# Patient Record
Sex: Male | Born: 1945 | Race: White | Hispanic: No | Marital: Single | Smoking: Never smoker
Health system: Southern US, Community
[De-identification: ages and names within clinical notes are randomized; demographics above are authoritative.]

## PROBLEM LIST (undated history)

## (undated) DIAGNOSIS — F329 Major depressive disorder, single episode, unspecified: Secondary | ICD-10-CM

## (undated) DIAGNOSIS — F32A Depression, unspecified: Secondary | ICD-10-CM

## (undated) DIAGNOSIS — E119 Type 2 diabetes mellitus without complications: Secondary | ICD-10-CM

## (undated) DIAGNOSIS — R4189 Other symptoms and signs involving cognitive functions and awareness: Secondary | ICD-10-CM

## (undated) DIAGNOSIS — I10 Essential (primary) hypertension: Secondary | ICD-10-CM

## (undated) DIAGNOSIS — H269 Unspecified cataract: Secondary | ICD-10-CM

## (undated) DIAGNOSIS — E785 Hyperlipidemia, unspecified: Secondary | ICD-10-CM

## (undated) HISTORY — DX: Major depressive disorder, single episode, unspecified: F32.9

## (undated) HISTORY — DX: Depression, unspecified: F32.A

## (undated) HISTORY — DX: Unspecified cataract: H26.9

## (undated) HISTORY — DX: Other symptoms and signs involving cognitive functions and awareness: R41.89

## (undated) HISTORY — DX: Essential (primary) hypertension: I10

## (undated) HISTORY — DX: Type 2 diabetes mellitus without complications: E11.9

## (undated) HISTORY — DX: Hyperlipidemia, unspecified: E78.5

## (undated) HISTORY — PX: DENTAL SURGERY: SHX609

## (undated) HISTORY — PX: EYE SURGERY: SHX253

## (undated) HISTORY — PX: OTHER SURGICAL HISTORY: SHX169

---

## 2000-10-15 ENCOUNTER — Encounter: Payer: Self-pay | Admitting: Emergency Medicine

## 2000-10-16 ENCOUNTER — Inpatient Hospital Stay (HOSPITAL_COMMUNITY): Admission: EM | Admit: 2000-10-16 | Discharge: 2000-10-17 | Payer: Self-pay | Admitting: Emergency Medicine

## 2006-07-20 ENCOUNTER — Ambulatory Visit: Payer: Self-pay | Admitting: Internal Medicine

## 2006-07-25 ENCOUNTER — Ambulatory Visit: Payer: Self-pay | Admitting: *Deleted

## 2006-07-28 ENCOUNTER — Ambulatory Visit (HOSPITAL_COMMUNITY): Admission: RE | Admit: 2006-07-28 | Discharge: 2006-07-28 | Payer: Self-pay | Admitting: Nurse Practitioner

## 2006-12-09 ENCOUNTER — Ambulatory Visit: Payer: Self-pay | Admitting: Internal Medicine

## 2007-01-19 ENCOUNTER — Encounter (INDEPENDENT_AMBULATORY_CARE_PROVIDER_SITE_OTHER): Payer: Self-pay | Admitting: Nurse Practitioner

## 2007-01-19 ENCOUNTER — Ambulatory Visit: Payer: Self-pay | Admitting: Family Medicine

## 2007-01-19 LAB — CONVERTED CEMR LAB: PSA: 1.26 ng/mL (ref 0.10–4.00)

## 2007-06-08 ENCOUNTER — Encounter (INDEPENDENT_AMBULATORY_CARE_PROVIDER_SITE_OTHER): Payer: Self-pay | Admitting: Nurse Practitioner

## 2007-06-08 ENCOUNTER — Ambulatory Visit: Payer: Self-pay | Admitting: Family Medicine

## 2007-06-08 LAB — CONVERTED CEMR LAB
Basophils Absolute: 0 10*3/uL (ref 0.0–0.1)
Basophils Relative: 0 % (ref 0–1)
Eosinophils Absolute: 0.2 10*3/uL (ref 0.0–0.7)
Eosinophils Relative: 3 % (ref 0–5)
HCT: 39.2 % (ref 39.0–52.0)
Hemoglobin: 12.9 g/dL — ABNORMAL LOW (ref 13.0–17.0)
Lymphocytes Relative: 22 % (ref 12–46)
Lymphs Abs: 1.7 10*3/uL (ref 0.7–4.0)
MCHC: 32.9 g/dL (ref 30.0–36.0)
MCV: 87.5 fL (ref 78.0–100.0)
Monocytes Absolute: 0.8 10*3/uL (ref 0.1–1.0)
Monocytes Relative: 10 % (ref 3–12)
Neutro Abs: 5.2 10*3/uL (ref 1.7–7.7)
Neutrophils Relative %: 65 % (ref 43–77)
Platelets: 295 10*3/uL (ref 150–400)
RBC: 4.48 M/uL (ref 4.22–5.81)
RDW: 14 % (ref 11.5–15.5)
WBC: 7.9 10*3/uL (ref 4.0–10.5)

## 2007-06-15 ENCOUNTER — Ambulatory Visit: Payer: Self-pay | Admitting: Internal Medicine

## 2007-06-19 ENCOUNTER — Ambulatory Visit: Payer: Self-pay | Admitting: Internal Medicine

## 2007-06-23 ENCOUNTER — Ambulatory Visit: Payer: Self-pay | Admitting: Internal Medicine

## 2007-11-16 ENCOUNTER — Ambulatory Visit: Payer: Self-pay | Admitting: Internal Medicine

## 2007-11-16 ENCOUNTER — Encounter (INDEPENDENT_AMBULATORY_CARE_PROVIDER_SITE_OTHER): Payer: Self-pay | Admitting: Adult Health

## 2007-11-16 LAB — CONVERTED CEMR LAB
ALT: 24 U/L
AST: 21 U/L
Albumin: 4.4 g/dL
Alkaline Phosphatase: 80 U/L
BUN: 14 mg/dL
Basophils Absolute: 0.1 K/uL
Basophils Relative: 1 %
CO2: 28 meq/L
Calcium: 9.7 mg/dL
Chloride: 103 meq/L
Creatinine, Ser: 0.85 mg/dL
Eosinophils Absolute: 0.4 K/uL
Eosinophils Relative: 4 %
Glucose, Bld: 93 mg/dL
HCT: 40.3 %
Hemoglobin: 13.2 g/dL
Lymphocytes Relative: 18 %
Lymphs Abs: 2.2 K/uL
MCHC: 32.8 g/dL
MCV: 88.6 fL
Microalb, Ur: 0.26 mg/dL
Monocytes Absolute: 1 K/uL
Monocytes Relative: 8 %
Neutro Abs: 8.4 K/uL — ABNORMAL HIGH
Neutrophils Relative %: 69 %
PSA: 1.48 ng/mL
Platelets: 309 K/uL
Potassium: 4.1 meq/L
RBC: 4.55 M/uL
RDW: 13.6 %
Sodium: 142 meq/L
T3 Uptake Ratio: 26.1 %
T4, Total: 10.7 ug/dL
TSH: 4.231 u[IU]/mL
Total Bilirubin: 0.6 mg/dL
Total Protein: 7.3 g/dL
WBC: 12.1 10*3/microliter — ABNORMAL HIGH

## 2007-11-23 ENCOUNTER — Ambulatory Visit: Payer: Self-pay | Admitting: Internal Medicine

## 2007-11-30 ENCOUNTER — Ambulatory Visit: Payer: Self-pay | Admitting: Internal Medicine

## 2007-12-07 ENCOUNTER — Ambulatory Visit: Payer: Self-pay | Admitting: Internal Medicine

## 2008-01-16 ENCOUNTER — Ambulatory Visit: Payer: Self-pay | Admitting: Internal Medicine

## 2008-04-10 ENCOUNTER — Encounter (INDEPENDENT_AMBULATORY_CARE_PROVIDER_SITE_OTHER): Payer: Self-pay | Admitting: Adult Health

## 2008-04-10 ENCOUNTER — Ambulatory Visit: Payer: Self-pay | Admitting: Internal Medicine

## 2008-04-10 LAB — CONVERTED CEMR LAB
ALT: 25 units/L (ref 0–53)
AST: 24 units/L (ref 0–37)
Albumin: 4.4 g/dL (ref 3.5–5.2)
Alkaline Phosphatase: 75 units/L (ref 39–117)
BUN: 16 mg/dL (ref 6–23)
Band Neutrophils: 0 % (ref 0–10)
Basophils Absolute: 0.1 10*3/uL (ref 0.0–0.1)
Basophils Relative: 1 % (ref 0–1)
CO2: 25 meq/L (ref 19–32)
Calcium: 9.4 mg/dL (ref 8.4–10.5)
Chloride: 102 meq/L (ref 96–112)
Cholesterol: 164 mg/dL (ref 0–200)
Creatinine, Ser: 1.09 mg/dL (ref 0.40–1.50)
Eosinophils Absolute: 0.4 10*3/uL (ref 0.0–0.7)
Eosinophils Relative: 4 % (ref 0–5)
Glucose, Bld: 125 mg/dL — ABNORMAL HIGH (ref 70–99)
HCT: 38.7 % — ABNORMAL LOW (ref 39.0–52.0)
HDL: 29 mg/dL — ABNORMAL LOW (ref >39)
Hemoglobin: 13.2 g/dL (ref 13.0–17.0)
LDL Cholesterol: 69 mg/dL (ref 0–99)
Lymphocytes Relative: 21 % (ref 12–46)
Lymphs Abs: 2.1 10*3/uL (ref 0.7–4.0)
MCHC: 34.1 g/dL (ref 30.0–36.0)
MCV: 85.2 fL (ref 78.0–100.0)
Microalb, Ur: 0.5 mg/dL (ref 0.00–1.89)
Monocytes Absolute: 1 10*3/uL (ref 0.1–1.0)
Monocytes Relative: 10 % (ref 3–12)
Neutro Abs: 6.3 10*3/uL (ref 1.7–7.7)
Neutrophils Relative %: 64 % (ref 43–77)
Platelets: 244 10*3/uL (ref 150–400)
Potassium: 3.9 meq/L (ref 3.5–5.3)
RBC: 4.54 M/uL (ref 4.22–5.81)
RDW: 14.1 % (ref 11.5–15.5)
Sodium: 142 meq/L (ref 135–145)
Total Bilirubin: 0.5 mg/dL (ref 0.3–1.2)
Total CHOL/HDL Ratio: 5.7
Total Protein: 7.2 g/dL (ref 6.0–8.3)
Triglycerides: 332 mg/dL — ABNORMAL HIGH (ref <150)
VLDL: 66 mg/dL — ABNORMAL HIGH (ref 0–40)
WBC: 9.8 10*3/uL (ref 4.0–10.5)

## 2008-06-05 ENCOUNTER — Ambulatory Visit: Payer: Self-pay | Admitting: Internal Medicine

## 2008-06-05 ENCOUNTER — Encounter (INDEPENDENT_AMBULATORY_CARE_PROVIDER_SITE_OTHER): Payer: Self-pay | Admitting: Adult Health

## 2008-06-05 LAB — CONVERTED CEMR LAB
ALT: 22 units/L (ref 0–53)
AST: 19 units/L (ref 0–37)
Albumin: 4.4 g/dL (ref 3.5–5.2)
Alkaline Phosphatase: 73 units/L (ref 39–117)
BUN: 17 mg/dL (ref 6–23)
CO2: 28 meq/L (ref 19–32)
Calcium: 9.3 mg/dL (ref 8.4–10.5)
Chloride: 101 meq/L (ref 96–112)
Cholesterol: 122 mg/dL (ref 0–200)
Creatinine, Ser: 0.94 mg/dL (ref 0.40–1.50)
Glucose, Bld: 157 mg/dL — ABNORMAL HIGH (ref 70–99)
HDL: 32 mg/dL — ABNORMAL LOW (ref >39)
LDL Cholesterol: 35 mg/dL (ref 0–99)
Potassium: 4 meq/L (ref 3.5–5.3)
Sodium: 142 meq/L (ref 135–145)
Total Bilirubin: 0.5 mg/dL (ref 0.3–1.2)
Total CHOL/HDL Ratio: 3.8
Total Protein: 7.2 g/dL (ref 6.0–8.3)
Triglycerides: 274 mg/dL — ABNORMAL HIGH (ref <150)
VLDL: 55 mg/dL — ABNORMAL HIGH (ref 0–40)

## 2008-07-15 ENCOUNTER — Ambulatory Visit: Payer: Self-pay | Admitting: Internal Medicine

## 2008-08-02 ENCOUNTER — Ambulatory Visit: Payer: Self-pay | Admitting: Internal Medicine

## 2008-10-15 ENCOUNTER — Ambulatory Visit: Payer: Self-pay | Admitting: Internal Medicine

## 2008-12-23 ENCOUNTER — Encounter (INDEPENDENT_AMBULATORY_CARE_PROVIDER_SITE_OTHER): Payer: Self-pay | Admitting: Adult Health

## 2008-12-23 ENCOUNTER — Ambulatory Visit: Payer: Self-pay | Admitting: Internal Medicine

## 2008-12-23 LAB — CONVERTED CEMR LAB
ALT: 16 units/L (ref 0–53)
AST: 17 units/L (ref 0–37)
Albumin: 4.4 g/dL (ref 3.5–5.2)
Alkaline Phosphatase: 60 units/L (ref 39–117)
BUN: 20 mg/dL (ref 6–23)
CO2: 25 meq/L (ref 19–32)
Calcium: 9.2 mg/dL (ref 8.4–10.5)
Chloride: 101 meq/L (ref 96–112)
Cholesterol: 134 mg/dL (ref 0–200)
Creatinine, Ser: 0.91 mg/dL (ref 0.40–1.50)
Glucose, Bld: 141 mg/dL — ABNORMAL HIGH (ref 70–99)
HDL: 31 mg/dL — ABNORMAL LOW (ref >39)
LDL Cholesterol: 69 mg/dL (ref 0–99)
Potassium: 4.5 meq/L (ref 3.5–5.3)
Sodium: 141 meq/L (ref 135–145)
Total Bilirubin: 0.4 mg/dL (ref 0.3–1.2)
Total CHOL/HDL Ratio: 4.3
Total Protein: 6.8 g/dL (ref 6.0–8.3)
Triglycerides: 171 mg/dL — ABNORMAL HIGH (ref <150)
VLDL: 34 mg/dL (ref 0–40)

## 2009-01-08 ENCOUNTER — Ambulatory Visit: Payer: Self-pay | Admitting: Internal Medicine

## 2009-04-08 ENCOUNTER — Encounter (INDEPENDENT_AMBULATORY_CARE_PROVIDER_SITE_OTHER): Payer: Self-pay | Admitting: Adult Health

## 2009-04-08 ENCOUNTER — Ambulatory Visit: Payer: Self-pay | Admitting: Internal Medicine

## 2009-04-08 LAB — CONVERTED CEMR LAB: Microalb, Ur: 0.68 mg/dL (ref 0.00–1.89)

## 2009-07-07 ENCOUNTER — Ambulatory Visit: Payer: Self-pay | Admitting: Internal Medicine

## 2009-07-07 ENCOUNTER — Encounter (INDEPENDENT_AMBULATORY_CARE_PROVIDER_SITE_OTHER): Payer: Self-pay | Admitting: Adult Health

## 2009-07-07 LAB — CONVERTED CEMR LAB
AST: 15 units/L (ref 0–37)
BUN: 20 mg/dL (ref 6–23)
Calcium: 9.2 mg/dL (ref 8.4–10.5)
Chloride: 101 meq/L (ref 96–112)
Creatinine, Ser: 0.89 mg/dL (ref 0.40–1.50)
HDL: 28 mg/dL — ABNORMAL LOW (ref >39)
Total CHOL/HDL Ratio: 4.3
Vitamin B-12: 194 pg/mL — ABNORMAL LOW (ref 211–911)

## 2009-07-21 ENCOUNTER — Ambulatory Visit: Payer: Self-pay | Admitting: Adult Health

## 2009-07-28 ENCOUNTER — Ambulatory Visit: Payer: Self-pay | Admitting: Internal Medicine

## 2009-08-05 ENCOUNTER — Ambulatory Visit: Payer: Self-pay | Admitting: Internal Medicine

## 2009-08-11 ENCOUNTER — Ambulatory Visit: Payer: Self-pay | Admitting: Internal Medicine

## 2009-09-01 ENCOUNTER — Encounter (INDEPENDENT_AMBULATORY_CARE_PROVIDER_SITE_OTHER): Payer: Self-pay | Admitting: Adult Health

## 2009-09-01 ENCOUNTER — Ambulatory Visit: Payer: Self-pay | Admitting: Internal Medicine

## 2009-09-15 ENCOUNTER — Ambulatory Visit: Payer: Self-pay | Admitting: Internal Medicine

## 2009-10-13 ENCOUNTER — Ambulatory Visit: Payer: Self-pay | Admitting: Internal Medicine

## 2009-11-13 ENCOUNTER — Ambulatory Visit: Payer: Self-pay | Admitting: Internal Medicine

## 2009-12-03 ENCOUNTER — Encounter (INDEPENDENT_AMBULATORY_CARE_PROVIDER_SITE_OTHER): Payer: Self-pay | Admitting: *Deleted

## 2009-12-03 LAB — CONVERTED CEMR LAB
HDL: 30 mg/dL — ABNORMAL LOW (ref >39)
LDL Cholesterol: 67 mg/dL (ref 0–99)
Total CHOL/HDL Ratio: 4.4
Triglycerides: 171 mg/dL — ABNORMAL HIGH (ref <150)
VLDL: 34 mg/dL (ref 0–40)

## 2010-06-19 NOTE — Consult Note (Signed)
[place]. [facility]  Patient:    Nicholas Hardin, Nicholas Hardin Visit Number: [id number] MRN: [id number]          Service Type: MED Location: [id number] Attending Physician:  Marily Memos Dictated by:   Tammy R. Collins Scotland, M.D. Admit Date:  10/15/2000                            Consultation Report  DATE OF BIRTH:  May 30, 1935  CHIEF COMPLAINT:  Vomiting, dizziness and decreased responsiveness.  HISTORY OF PRESENT ILLNESS:  The patient is a 65 year old white male who had one episode of nausea and vomiting Friday night after eating a Kizzie Fantasia with his family.  He felt bad most of the day yesterday and had an episode of decreased responsiveness, nausea and vomiting x 2, and was noted to feel hot. EMS was called and he was transported to the emergency room.  He had one subsequent episode of vomiting in the emergency room.  No diarrhea.  No history of sick exposure.  He complains of dizziness, like being on a Ferris wheel, that started at home suddenly and has since improved.  He complains of slight headache.   He has had no melena, BRBPR, or hematemesis.  he does complain of mild diffuse abdominal pain.  PAST MEDICAL HISTORY:  Questionable IBS (prescribed ______ for stomach problems per mother).  Teeth pulled.  "Slow learner."  ALLERGIES:  NKDA.  MEDICATIONS:  ______ 0.375 mg, one p.o. b.i.d.  SOCIAL HISTORY:  He works at [employer] as a Public affairs consultant.  He lives with his father.  No tobacco, alcohol or drugs.  FAMILY HISTORY:  Positive for diabetes, CAD, stroke, cirrhosis.  REVIEW OF SYSTEMS:  Positive for fever, nausea, vomiting, dizziness, mild headache, and mild abdominal pain.  No hematemesis or melena.  No BRBPR.  No diarrhea or dysuria.  No frequency or cough.  No sore throat or rhinorrhea. He had complaints of right arm pain at the site of blood draw after it was drawn.  No chest pain.  PHYSICAL EXAMINATION:  VITAL SIGNS:  Temperature 100.0, blood  pressure 134/71, pulse 86, respirations 20, pulse oximetry 94% on room air.  GENERAL:   Obese.  NAD.  Licking lips.  SKIN:  Warm and dry.  No rash.  HEENT:  TMs clear.  EOMI.  PERRL.  Fundi poorly visualized.  No scleral icterus.  No nystagmus.  Oropharynx clear.  Moist mucosa.  Edentulous.  NECK:  Supple.  No thyromegaly.  No JVD or bruits.  LUNGS:  Clear to auscultation bilaterally with decreased breath sounds.  No wheezes or crackles.  BACK:  No CVAT.  CARDIOVASCULAR:  RRR.  S1 and S2.  No MHR.  ABDOMEN:  Soft.  Positive bowel sounds.  Mild diffuse tenderness.  No HSM.  No masses.  GENITOURINARY:  NMG.  Uncircumcised.  Descended testicles.  RECTAL:  Guaiac negative.  Normal tone.  Prostate not palpated.  EXTREMITIES:  No CCE.  Radial and DP pulsed 2+  NEUROLOGIC:  Alert and oriented x 3.  Cranial nerves II-XII intact.  ______. Trace bicep and knee DTR.  Sensory intact to fine touch.  LABORATORY DATA:  White blood cell count 19.1, hemoglobin 13.4, platelets 313, 87% neutrophils.  AMC 16.6, sodium 139, potassium 3.5, chloride 102, bicarb 26, BUN 9, glucose 199.  UA shows specific gravity 1.014, negative nitrates, trace LE, 3-6 wbcs, rare bacteria.  One-view chest x-ray shows  cardiomegaly.  Abdominal films show ileus.  ASSESSMENT AND PLAN:  A 65 year old white male with neutrophilia, nausea, vomiting, ileus, and vertigo with normal neurologic exam.  To be admitted for IV fluids, n.p.o., two blood cultures for source of infection.  Repeat chest x-ray.  Culture urine.  Check hemoglobin A1c with elevated glucose.  Observe. Dictated by:   Tammy R. Collins Scotland, M.D. Attending Physician:  Marily Memos DD:  10/16/00 TD:  10/16/00 Job: [id number]

## 2011-12-20 ENCOUNTER — Ambulatory Visit (INDEPENDENT_AMBULATORY_CARE_PROVIDER_SITE_OTHER): Payer: Medicare Other | Admitting: Family Medicine

## 2011-12-20 VITALS — BP 130/60 | HR 60 | Temp 97.3°F | Resp 16 | Ht 63.0 in | Wt 229.0 lb

## 2011-12-20 DIAGNOSIS — I10 Essential (primary) hypertension: Secondary | ICD-10-CM

## 2011-12-20 DIAGNOSIS — E119 Type 2 diabetes mellitus without complications: Secondary | ICD-10-CM

## 2011-12-20 DIAGNOSIS — E785 Hyperlipidemia, unspecified: Secondary | ICD-10-CM

## 2011-12-20 DIAGNOSIS — Z79899 Other long term (current) drug therapy: Secondary | ICD-10-CM

## 2011-12-20 LAB — COMPREHENSIVE METABOLIC PANEL
ALT: 13 U/L (ref 0–53)
CO2: 30 mEq/L (ref 19–32)
Calcium: 9.5 mg/dL (ref 8.4–10.5)
Chloride: 102 mEq/L (ref 96–112)
Creat: 1.1 mg/dL (ref 0.50–1.35)
Glucose, Bld: 158 mg/dL — ABNORMAL HIGH (ref 70–99)
Total Bilirubin: 0.7 mg/dL (ref 0.3–1.2)

## 2011-12-20 LAB — LIPID PANEL
Cholesterol: 150 mg/dL (ref 0–200)
Total CHOL/HDL Ratio: 4.7 Ratio
Triglycerides: 264 mg/dL — ABNORMAL HIGH (ref <150)
VLDL: 53 mg/dL — ABNORMAL HIGH (ref 0–40)

## 2011-12-20 MED ORDER — SIMVASTATIN 20 MG PO TABS: 20.0000 mg | ORAL_TABLET | Freq: Every evening | ORAL | Status: DC

## 2011-12-20 MED ORDER — GLIMEPIRIDE 4 MG PO TABS: 4.0000 mg | ORAL_TABLET | Freq: Two times a day (BID) | ORAL | Status: DC

## 2011-12-20 MED ORDER — AMMONIUM LACTATE 12 % EX CREA: 12.0000 | TOPICAL_CREAM | CUTANEOUS | Status: DC | PRN

## 2011-12-20 MED ORDER — ATENOLOL 50 MG PO TABS: 50.0000 mg | ORAL_TABLET | Freq: Every day | ORAL | Status: DC

## 2011-12-20 MED ORDER — LISINOPRIL 5 MG PO TABS: 5.0000 mg | ORAL_TABLET | Freq: Every day | ORAL | Status: DC

## 2011-12-20 MED ORDER — HYDROCHLOROTHIAZIDE 25 MG PO TABS: 25.0000 mg | ORAL_TABLET | Freq: Every day | ORAL | Status: DC

## 2011-12-20 NOTE — Progress Notes (Signed)
Subjective:    Patient ID: Nicholas Hardin, male    DOB: March 03, 1945, 66 y.o.   MRN: [id number]  HPI  Nicholas Hardin is a delightful 66 yo male, a former pt of mine from [place], who is here today to re-establish care and is accompanied by Ms. Kathi Der, his aunt, who checks and records his cbgs. Fasting cbgs have been running 149-211 - mainly in 170s. No hypoglycemic episodes. Reports he is doing well on the amaryl.  I don't remember but Ms. Christell Constant reminds me that we had previously tried pt on metformin prev but apparently I stopped it - they report it was because his DM was well controlled.  Walking 1 1/2 mi total daily - goes on a short lap around home about 5x/day - and trying to eat healthy Seeing Dr. Ralene Cork q 3mos (podiatry) and Dr. Dione Booze (optho) yrly. Checks feet daily and no problems with them.    Past Medical History  Diagnosis Date  . Diabetes mellitus without complication   . Hypertension    Current Outpatient Prescriptions on File Prior to Visit  Medication Sig Dispense Refill  . atenolol (TENORMIN) 50 MG tablet Take 1 tablet (50 mg total) by mouth daily.  30 tablet  5  . ferrous sulfate 325 (65 FE) MG tablet Take 325 mg by mouth daily with breakfast.      . glimepiride (AMARYL) 4 MG tablet Take 1 tablet (4 mg total) by mouth 2 (two) times daily.  60 tablet  5  . hydrochlorothiazide (HYDRODIURIL) 25 MG tablet Take 1 tablet (25 mg total) by mouth daily.  30 tablet  5  . lisinopril (PRINIVIL,ZESTRIL) 5 MG tablet Take 1 tablet (5 mg total) by mouth daily.  30 tablet  5  . simvastatin (ZOCOR) 20 MG tablet Take 1 tablet (20 mg total) by mouth every evening.  30 tablet  5     Review of Systems  Constitutional: Negative for fever, chills, activity change, appetite change and unexpected weight change.  Eyes: Negative for visual disturbance.  Cardiovascular: Negative for leg swelling.  Musculoskeletal: Positive for arthralgias. Negative for myalgias, joint swelling and gait problem.  Skin:  Negative for color change and rash.  Neurological: Negative for dizziness, syncope, weakness and light-headedness.  Psychiatric/Behavioral: Negative for behavioral problems, sleep disturbance and dysphoric mood. The patient is not nervous/anxious.       BP 130/60  Pulse 60  Temp 97.3 F (36.3 C) (Oral)  Resp 16  Ht 5\' 3"  (1.6 m)  Wt 229 lb (103.874 kg)  BMI 40.57 kg/m2  SpO2 98% Objective:   Physical Exam  Constitutional: He appears well-developed and well-nourished. No distress.  HENT:  Head: Normocephalic and atraumatic.  Eyes: Conjunctivae normal are normal. Pupils are equal, round, and reactive to light. No scleral icterus.  Neck: Normal range of motion. Neck supple. No thyromegaly present.  Cardiovascular: Normal rate, regular rhythm, normal heart sounds and intact distal pulses.   Pulmonary/Chest: Effort normal and breath sounds normal. No respiratory distress.  Musculoskeletal: He exhibits no edema.  Lymphadenopathy:    He has no cervical adenopathy.  Neurological: He is alert.  Skin: Skin is warm and dry. He is not diaphoretic.  Psychiatric: He has a normal mood and affect. His behavior is normal.      Results for orders placed in visit on 12/20/11  POCT GLYCOSYLATED HEMOGLOBIN (HGB A1C)      Component Value Range   Hemoglobin A1C 6.3    COMPREHENSIVE METABOLIC PANEL  Component Value Range   Sodium 140  135 - 145 mEq/L   Potassium 4.2  3.5 - 5.3 mEq/L   Chloride 102  96 - 112 mEq/L   CO2 30  19 - 32 mEq/L   Glucose, Bld 158 (*) 70 - 99 mg/dL   BUN 25 (*) 6 - 23 mg/dL   Creat 1.61  0.96 - 0.45 mg/dL   Total Bilirubin 0.7  0.3 - 1.2 mg/dL   Alkaline Phosphatase 51  39 - 117 U/L   AST 15  0 - 37 U/L   ALT 13  0 - 53 U/L   Total Protein 6.8  6.0 - 8.3 g/dL   Albumin 4.6  3.5 - 5.2 g/dL   Calcium 9.5  8.4 - 40.9 mg/dL  LIPID PANEL      Component Value Range   Cholesterol 150  0 - 200 mg/dL   Triglycerides 811 (*) <150 mg/dL   HDL 32 (*) >91 mg/dL    Total CHOL/HDL Ratio 4.7     VLDL 53 (*) 0 - 40 mg/dL   LDL Cholesterol 65  0 - 99 mg/dL       Assessment & Plan:   1. Diabetes  POCT glycosylated hemoglobin (Hb A1C), glimepiride (AMARYL) 4 MG tablet, lisinopril (PRINIVIL,ZESTRIL) 5 MG tablet A1C and reported home cbgs certainly don't correlate. However, since pt is not able to check his own cbgs and Ms. Christell Constant, his aunt who does check them, does not live with him, I understand why the random sugars may not be overall acurrate of the larger picture.  It would be best to have pt on metformin rather than (or in addition to) amaryl - will get his prior records to review if we tried it and why it was stopped as well as past control. Cont asa 81, will need urine microalb at f/u.  2. Encounter for long-term (current) use of other medications  Comprehensive metabolic panel  3. Other and unspecified hyperlipidemia  Lipid panel, simvastatin (ZOCOR) 20 MG tablet Goal LDL <100.  Pt has elevated triglycerides so we may want to augment w/ fibrate but will get prior records to see what we have tried in the past first.  4. Unspecified essential hypertension  Comprehensive metabolic panel, atenolol (TENORMIN) 50 MG tablet, hydrochlorothiazide (HYDRODIURIL) 25 MG tablet, lisinopril (PRINIVIL,ZESTRIL) 5 MG tablet Consider stopping atenolol and placing on lisinopril-hctz 20/25 to reduce copays (3 ->1) and increase compliance.  Get prior records first.

## 2011-12-21 ENCOUNTER — Encounter: Payer: Self-pay | Admitting: Family Medicine

## 2012-01-19 ENCOUNTER — Telehealth: Payer: Self-pay

## 2012-02-02 ENCOUNTER — Other Ambulatory Visit: Payer: Self-pay | Admitting: Radiology

## 2012-02-02 NOTE — Telephone Encounter (Signed)
[facility] 6 month supply of his lisinopril 5mg , sent this in, since Dr Clelia Croft did authorize the 5 refills, changed to 6 mo so he can get all at once.

## 2012-04-20 ENCOUNTER — Telehealth: Payer: Self-pay

## 2012-04-20 DIAGNOSIS — E119 Type 2 diabetes mellitus without complications: Secondary | ICD-10-CM

## 2012-04-20 DIAGNOSIS — I10 Essential (primary) hypertension: Secondary | ICD-10-CM

## 2012-04-20 MED ORDER — LISINOPRIL 5 MG PO TABS: 5.0000 mg | ORAL_TABLET | Freq: Every day | ORAL | Status: DC

## 2012-04-20 NOTE — Telephone Encounter (Signed)
Yes, that's fine. Thanks!

## 2012-04-20 NOTE — Telephone Encounter (Signed)
Dr Clelia Croft, [facility] sent a fax requesting a 6 mos RF of his lisinopril 5 mg. Do you want to authorize for this long?

## 2012-05-25 ENCOUNTER — Other Ambulatory Visit: Payer: Self-pay | Admitting: Radiology

## 2012-05-25 NOTE — Telephone Encounter (Signed)
, °

## 2012-06-16 ENCOUNTER — Encounter: Payer: Self-pay | Admitting: Family Medicine

## 2012-06-16 ENCOUNTER — Ambulatory Visit: Payer: Medicare Other

## 2012-06-16 ENCOUNTER — Ambulatory Visit (INDEPENDENT_AMBULATORY_CARE_PROVIDER_SITE_OTHER): Payer: Medicare Other | Admitting: Family Medicine

## 2012-06-16 VITALS — BP 128/62 | HR 58 | Temp 97.7°F | Resp 16 | Ht 64.0 in | Wt 234.6 lb

## 2012-06-16 DIAGNOSIS — Z79899 Other long term (current) drug therapy: Secondary | ICD-10-CM

## 2012-06-16 DIAGNOSIS — R197 Diarrhea, unspecified: Secondary | ICD-10-CM

## 2012-06-16 DIAGNOSIS — E119 Type 2 diabetes mellitus without complications: Secondary | ICD-10-CM

## 2012-06-16 DIAGNOSIS — R1013 Epigastric pain: Secondary | ICD-10-CM

## 2012-06-16 DIAGNOSIS — E538 Deficiency of other specified B group vitamins: Secondary | ICD-10-CM

## 2012-06-16 DIAGNOSIS — E785 Hyperlipidemia, unspecified: Secondary | ICD-10-CM

## 2012-06-16 DIAGNOSIS — G8929 Other chronic pain: Secondary | ICD-10-CM

## 2012-06-16 DIAGNOSIS — I10 Essential (primary) hypertension: Secondary | ICD-10-CM

## 2012-06-16 DIAGNOSIS — Z23 Encounter for immunization: Secondary | ICD-10-CM

## 2012-06-16 DIAGNOSIS — IMO0001 Reserved for inherently not codable concepts without codable children: Secondary | ICD-10-CM

## 2012-06-16 LAB — TSH: TSH: 3.607 u[IU]/mL (ref 0.350–4.500)

## 2012-06-16 LAB — COMPREHENSIVE METABOLIC PANEL
Albumin: 4.3 g/dL (ref 3.5–5.2)
BUN: 22 mg/dL (ref 6–23)
Calcium: 9.7 mg/dL (ref 8.4–10.5)
Chloride: 103 mEq/L (ref 96–112)
Creat: 1.09 mg/dL (ref 0.50–1.35)
Glucose, Bld: 123 mg/dL — ABNORMAL HIGH (ref 70–99)
Potassium: 4.5 mEq/L (ref 3.5–5.3)

## 2012-06-16 LAB — POCT URINALYSIS DIPSTICK
Bilirubin, UA: NEGATIVE
Glucose, UA: NEGATIVE
Leukocytes, UA: NEGATIVE
Nitrite, UA: NEGATIVE
pH, UA: 6

## 2012-06-16 LAB — VITAMIN B12: Vitamin B-12: 1322 pg/mL — ABNORMAL HIGH (ref 211–911)

## 2012-06-16 LAB — POCT UA - MICROSCOPIC ONLY: Yeast, UA: NEGATIVE

## 2012-06-16 LAB — LIPID PANEL
Cholesterol: 126 mg/dL (ref 0–200)
Triglycerides: 234 mg/dL — ABNORMAL HIGH (ref <150)
VLDL: 47 mg/dL — ABNORMAL HIGH (ref 0–40)

## 2012-06-16 LAB — POCT GLYCOSYLATED HEMOGLOBIN (HGB A1C): Hemoglobin A1C: 7

## 2012-06-16 MED ORDER — GLIMEPIRIDE 4 MG PO TABS: 4.0000 mg | ORAL_TABLET | Freq: Two times a day (BID) | ORAL | Status: DC

## 2012-06-16 MED ORDER — HYDROCHLOROTHIAZIDE 25 MG PO TABS: 25.0000 mg | ORAL_TABLET | Freq: Every day | ORAL | Status: DC

## 2012-06-16 MED ORDER — LISINOPRIL 5 MG PO TABS: 5.0000 mg | ORAL_TABLET | Freq: Every day | ORAL | Status: DC

## 2012-06-16 MED ORDER — ATENOLOL 50 MG PO TABS: 50.0000 mg | ORAL_TABLET | Freq: Every day | ORAL | Status: DC

## 2012-06-16 MED ORDER — AMMONIUM LACTATE 12 % EX CREA: 12.0000 | TOPICAL_CREAM | CUTANEOUS | Status: DC | PRN

## 2012-06-16 NOTE — Progress Notes (Addendum)
Subjective:    Patient ID: Nicholas Hardin, male    DOB: 11-13-1945, 67 y.o.   MRN: [id number] Chief Complaint  Patient presents with  . Diabetes   HPI cbgs 200 - 220 first thing in the morning but pt is actually not fasting then as he has a habit of getting up and eating potato chips all night.  No lows. Doing better on DM diet during the day when Kathie Rhodes is preparing his meals. Doing great walking - [place] 7x[place] for exercise.  Still having problems w/ stomach.  He is having a lot of diarrhea - occ constipation and alternating with diarrhea.  Taking prn imodium but sxs getting worse. Had colonoscopy at [facility] 05/2008 that was nml except for adenomatous polyp.  Past Medical History  Diagnosis Date  . Diabetes mellitus without complication   . Hypertension   . Cognitive deficits    Current Outpatient Prescriptions on File Prior to Visit  Medication Sig Dispense Refill  . aspirin 81 MG tablet Take 81 mg by mouth daily.      . Cyanocobalamin (B-12) 500 MCG TABS Take 500 mcg by mouth.      . ferrous sulfate 325 (65 FE) MG tablet Take 325 mg by mouth daily with breakfast.      . Multiple Vitamin (MULTIVITAMIN) tablet Take 1 tablet by mouth daily.       No current facility-administered medications on file prior to visit.   Allergies  Allergen Reactions  . Sulfa Antibiotics Hives   No past surgical history on file.   Review of Systems  Constitutional: Negative for fever, chills, activity change and appetite change.  Eyes: Negative for visual disturbance.  Respiratory: Negative for shortness of breath.   Cardiovascular: Negative for chest pain and leg swelling.  Gastrointestinal: Positive for abdominal pain, diarrhea and constipation. Negative for nausea, vomiting, blood in stool, abdominal distention, anal bleeding and rectal pain.  Neurological: Negative for dizziness, syncope, facial asymmetry, weakness, light-headedness and headaches.      BP 128/62   Pulse 58  Temp(Src) 97.7 F (36.5 C) (Oral)  Resp 16  Ht 5\' 4"  (1.626 m)  Wt 234 lb 9.6 oz (106.414 kg)  BMI 40.25 kg/m2  SpO2 97% Objective:   Physical Exam  Constitutional: He is oriented to person, place, and time. He appears well-developed and well-nourished. No distress.  HENT:  Head: Normocephalic and atraumatic.  Eyes: Conjunctivae are normal. Pupils are equal, round, and reactive to light. No scleral icterus.  Neck: Normal range of motion. Neck supple. No thyromegaly present.  Cardiovascular: Normal rate, regular rhythm, normal heart sounds and intact distal pulses.   Pulmonary/Chest: Effort normal and breath sounds normal. No respiratory distress.  Musculoskeletal: He exhibits no edema.  Lymphadenopathy:    He has no cervical adenopathy.  Neurological: He is alert and oriented to person, place, and time.  Skin: Skin is warm and dry. He is not diaphoretic.  Psychiatric: He has a normal mood and affect. His behavior is normal.      Results for orders placed in visit on 06/16/12  POCT GLYCOSYLATED HEMOGLOBIN (HGB A1C)      Result Value Range   Hemoglobin A1C 7.0    POCT UA - MICROSCOPIC ONLY      Result Value Range   WBC, Ur, HPF, POC 0-1     RBC, urine, microscopic 0-2     Bacteria, U Microscopic neg     Mucus, UA trace  Epithelial cells, urine per micros 0-2     Crystals, Ur, HPF, POC neg     Casts, Ur, LPF, POC neg     Yeast, UA neg    POCT URINALYSIS DIPSTICK      Result Value Range   Color, UA yellow     Clarity, UA clear     Glucose, UA neg     Bilirubin, UA neg     Ketones, UA neg     Spec Grav, UA 1.015     Blood, UA neg     pH, UA 6.0     Protein, UA neg     Urobilinogen, UA 0.2     Nitrite, UA neg     Leukocytes, UA Negative        [facility] reading (PRIMARY) by  Dr. Clelia Croft. KUB: Non-specific bowel gas abnormality. RUQ calcification - large gall stone or porcelain gallbladder?  Assessment & Plan:  Refill cholesterol medication AFTER lipid panel  returns. B12 deficiency - Plan: Vitamin B12 - [facility] so check level to see if needs to restart (once shortage resolves).  Type II or unspecified type diabetes mellitus without mention of complication, uncontrolled - Plan: Microalbumin, urine, POCT glycosylated hemoglobin (Hb A1C) - [name] is going to try to stop his midnight snacking. If hgba1c increased a f/u, will have to add additional med. (hx metformin intolerance but could be tied to GI c/o). Cont glimepiride (AMARYL) 4 MG tablet, lisinopril (PRINIVIL,ZESTRIL) 5 MG tablet  Encounter for long-term (current) use of other medications - Plan: PSA, Lipid panel  Essential hypertension, benign - Plan: TSH, Comprehensive metabolic panel. Cont atenolol (TENORMIN) 50 MG tablet, hydrochlorothiazide (HYDRODIURIL) 25 MG tablet, lisinopril (PRINIVIL,ZESTRIL) 5 MG tablet  Diarrhea - Plan: Ambulatory referral to Gastroenterology - had recent colonoscopy in 05/2008 w/ adenomatous polyps at [facility] so no need for f/u till 2015 so will cancel if gallbladder thought to be cause.  Abdominal pain, chronic, epigastric - Plan: DG Abd 1 View, POCT UA - Microscopic Only, POCT urinalysis dipstick, Ambulatory referral to Gastroenterology - possible gall bladder concerns - if radiology xray overread confirms, will order RUQ [place] and if confirms gall stones, rec cancelling GI referral and surgery referral instead.  Need for prophylactic vaccination against Streptococcus pneumoniae (pneumococcus) - Plan: Pneumococcal polysaccharide vaccine 23-valent greater than or equal to 2yo subcutaneous/IM  Rec shingles vaccine at pharmacy.  Meds ordered this encounter  Medications  . OVER THE COUNTER MEDICATION    Sig: 2 mg.  Marland Kitchen OVER THE COUNTER MEDICATION    Sig:   . atenolol (TENORMIN) 50 MG tablet    Sig: Take 1 tablet (50 mg total) by mouth daily.    Dispense:  30 tablet    Refill:  7  . ammonium lactate (AMLACTIN) 12 % cream    Sig:  Apply 12 Bottles topically as needed.    Dispense:  385 g    Refill:  8  . glimepiride (AMARYL) 4 MG tablet    Sig: Take 1 tablet (4 mg total) by mouth 2 (two) times daily.    Dispense:  60 tablet    Refill:  7  . hydrochlorothiazide (HYDRODIURIL) 25 MG tablet    Sig: Take 1 tablet (25 mg total) by mouth daily.    Dispense:  30 tablet    Refill:  7  . lisinopril (PRINIVIL,ZESTRIL) 5 MG tablet    Sig: Take 1 tablet (5 mg total) by mouth  daily.    Dispense:  30 tablet    Refill:  7

## 2012-06-16 NOTE — Patient Instructions (Signed)
The next time you are at your pharmacy, check into getting you Shingles shot - information about the Shingles vaccine is below.  Herpes Zoster Virus Vaccine What is this medicine? HERPES ZOSTER VIRUS VACCINE (HUR peez ZOS ter vahy ruhs vak SEEN) is a vaccine. It is used to prevent shingles in adults 65 years old and over. This vaccine is not used to treat shingles or nerve pain from shingles. This medicine may be used for other purposes; ask your health care provider or pharmacist if you have questions. What should I tell my health care provider before I take this medicine? They need to know if you have any of these conditions: -cancer like leukemia or lymphoma -immune system problems or therapy -infection with fever -tuberculosis -an unusual or allergic reaction to vaccines, neomycin, gelatin, other medicines, foods, dyes, or preservatives -pregnant or trying to get pregnant -breast-feeding How should I use this medicine? This vaccine is for injection under the skin. It is given by a health care professional. Talk to your pediatrician regarding the use of this medicine in children. This medicine is not approved for use in children. Overdosage: If you think you have taken too much of this medicine contact a poison control center or emergency room at once. NOTE: This medicine is only for you. Do not share this medicine with others. What if I miss a dose? This does not apply. What may interact with this medicine? Do not take this medicine with any of the following medications: -adalimumab -anakinra -etanercept -infliximab -medicines to treat cancer -medicines that suppress your immune system This medicine may also interact with the following medications: -immunoglobulins -steroid medicines like prednisone or cortisone This list may not describe all possible interactions. Give your health care provider a list of all the medicines, herbs, non-prescription drugs, or dietary supplements  you use. Also tell them if you smoke, drink alcohol, or use illegal drugs. Some items may interact with your medicine. What should I watch for while using this medicine? Visit your doctor for regular check ups. This vaccine, like all vaccines, may not fully protect everyone. After receiving this vaccine it may be possible to pass chickenpox infection to others. Avoid people with immune system problems, pregnant women who have not had chickenpox, and newborns of women who have not had chickenpox. Talk to your doctor for more information. What side effects may I notice from receiving this medicine? Side effects that you should report to your doctor or health care professional as soon as possible: -allergic reactions like skin rash, itching or hives, swelling of the face, lips, or tongue -breathing problems -feeling faint or lightheaded, falls -fever, flu-like symptoms -pain, tingling, numbness in the hands or feet -swelling of the ankles, feet, hands -unusually weak or tired Side effects that usually do not require medical attention (report to your doctor or health care professional if they continue or are bothersome): -aches or pains -chickenpox-like rash -diarrhea -headache -loss of appetite -nausea, vomiting -redness, pain, swelling at site where injected -runny nose This list may not describe all possible side effects. Call your doctor for medical advice about side effects. You may report side effects to FDA at 1-800-FDA-1088. Where should I keep my medicine? This drug is given in a hospital or clinic and will not be stored at home. NOTE: This sheet is a summary. It may not cover all possible information. If you have questions about this medicine, talk to your doctor, pharmacist, or health care provider.  2013, [employer]vier/[employer] Standard. (07/07/2009  5:43:50 PM)

## 2012-06-17 LAB — MICROALBUMIN, URINE: Microalb, Ur: 0.58 mg/dL (ref 0.00–1.89)

## 2012-06-28 ENCOUNTER — Telehealth: Payer: Self-pay

## 2012-06-28 DIAGNOSIS — K8000 Calculus of gallbladder with acute cholecystitis without obstruction: Secondary | ICD-10-CM

## 2012-06-28 NOTE — Telephone Encounter (Signed)
Yes, please place referral to surgeon for symptomatic cholelithiasis. Also shows the possibility of a kidney stone but as long as he is not having blood in his urine or severe LEFT flank/back pain (gallbladder in on right) we will just continue to follow that clinically - don't need to do anything about it now if it is not bothering him - we can just recheck it at his next visit unless he develops sxs.

## 2012-06-28 NOTE — Telephone Encounter (Signed)
Nicholas Hardin is calling to get results of nephew's x-ray. Thinks he may be having gallbladder issues. [phone]

## 2012-06-28 NOTE — Telephone Encounter (Signed)
IMPRESSION:  Normal bowel gas pattern.  Cholelithiasis and questionable left renal calculus as above. Advised Nicholas Hardin his xray does show gall stones, she wants to know if he needs to see a Careers adviser for this, please advise.

## 2012-06-29 MED ORDER — SIMVASTATIN 20 MG PO TABS: 20.0000 mg | ORAL_TABLET | Freq: Every evening | ORAL | Status: DC

## 2012-06-29 NOTE — Telephone Encounter (Signed)
Thanks, referral made. Ashok Cordia.

## 2012-06-30 ENCOUNTER — Telehealth: Payer: Self-pay | Admitting: Family Medicine

## 2012-06-30 NOTE — Telephone Encounter (Signed)
Spoke with Ms Nicholas Hardin his aunt and gave her his lab results per patient

## 2012-07-07 ENCOUNTER — Ambulatory Visit (INDEPENDENT_AMBULATORY_CARE_PROVIDER_SITE_OTHER): Payer: Medicare Other | Admitting: General Surgery

## 2012-07-24 ENCOUNTER — Ambulatory Visit (INDEPENDENT_AMBULATORY_CARE_PROVIDER_SITE_OTHER): Payer: Medicare Other | Admitting: General Surgery

## 2012-08-08 ENCOUNTER — Ambulatory Visit (INDEPENDENT_AMBULATORY_CARE_PROVIDER_SITE_OTHER): Payer: Medicare Other | Admitting: General Surgery

## 2012-08-08 ENCOUNTER — Telehealth (INDEPENDENT_AMBULATORY_CARE_PROVIDER_SITE_OTHER): Payer: Self-pay | Admitting: General Surgery

## 2012-08-08 ENCOUNTER — Encounter (INDEPENDENT_AMBULATORY_CARE_PROVIDER_SITE_OTHER): Payer: Self-pay | Admitting: General Surgery

## 2012-08-08 VITALS — BP 120/70 | HR 68 | Temp 98.4°F | Resp 16 | Ht 63.0 in | Wt 237.8 lb

## 2012-08-08 DIAGNOSIS — K8020 Calculus of gallbladder without cholecystitis without obstruction: Secondary | ICD-10-CM

## 2012-08-08 NOTE — Patient Instructions (Signed)
Will call with results of u/s

## 2012-08-08 NOTE — Telephone Encounter (Signed)
Patient  Is aware of appt on [address] 08/15/12 830am npo midnight

## 2012-08-10 NOTE — Progress Notes (Signed)
Subjective:     Patient ID: Nicholas Hardin, male   DOB: 02/04/1945, 67 y.o.   MRN: [id number]  HPI We're asked to see the patient in consultation by Dr. Shaw to evaluate him for gallstones. The patient is a 67-year-old white male who has been having epigastric and right upper quadrant pain off and on for the last 6 months. The pain occurs 2-3 times per week and last for several hours. He has had no nausea or vomiting associated with it. He does have some diarrhea associated with it. As part of his workup he had a chest x-ray that suggested there may be stones in the gallbladder.  Review of Systems  Constitutional: Negative.   HENT: Negative.   Eyes: Negative.   Respiratory: Negative.   Cardiovascular: Negative.   Gastrointestinal: Positive for abdominal pain and diarrhea.  Endocrine: Negative.   Genitourinary: Negative.   Musculoskeletal: Negative.   Skin: Negative.   Allergic/Immunologic: Negative.   Neurological: Negative.   Hematological: Negative.   Psychiatric/Behavioral: Negative.        Objective:   Physical Exam  Constitutional: He is oriented to person, place, and time. He appears well-developed and well-nourished.  HENT:  Head: Normocephalic and atraumatic.  Eyes: Conjunctivae and EOM are normal. Pupils are equal, round, and reactive to light.  Neck: Normal range of motion. Neck supple.  Cardiovascular: Normal rate, regular rhythm and normal heart sounds.   Pulmonary/Chest: Effort normal and breath sounds normal.  Abdominal: Soft. Bowel sounds are normal.  There is mild epigastric and right upper quadrant discomfort with palpation. There is no palpable mass.  Musculoskeletal: Normal range of motion.  Neurological: He is alert and oriented to person, place, and time.  Skin: Skin is warm and dry.  Psychiatric: He has a normal mood and affect. His behavior is normal.       Assessment:     The patient has some symptoms that could be consistent with biliary colic.  Unfortunately he has not had an ultrasound to confirm that these are gallstones were seen on his chest x-ray.     Plan:     We will start by obtaining an ultrasound of his abdomen. If this shows gallstones and I think he is a candidate for laparoscopic cholecystectomy. I've discussed with him in detail the risks and benefits of the operation as well as some of the technical aspects and he understands and wishes to proceed      

## 2012-08-15 ENCOUNTER — Other Ambulatory Visit (INDEPENDENT_AMBULATORY_CARE_PROVIDER_SITE_OTHER): Payer: Self-pay | Admitting: General Surgery

## 2012-08-15 ENCOUNTER — Ambulatory Visit
Admission: RE | Admit: 2012-08-15 | Discharge: 2012-08-15 | Disposition: A | Discharge status: Home / Self Care | Payer: Medicare Other | Source: Ambulatory Visit | Attending: General Surgery | Admitting: General Surgery

## 2012-08-15 DIAGNOSIS — K8020 Calculus of gallbladder without cholecystitis without obstruction: Secondary | ICD-10-CM

## 2012-08-23 ENCOUNTER — Encounter (HOSPITAL_COMMUNITY): Payer: Self-pay | Admitting: Pharmacist

## 2012-08-30 ENCOUNTER — Encounter (HOSPITAL_COMMUNITY)
Admission: RE | Admit: 2012-08-30 | Discharge: 2012-08-30 | Disposition: A | Discharge status: Home / Self Care | Payer: Medicare Other | Source: Ambulatory Visit | Attending: Anesthesiology | Admitting: Anesthesiology

## 2012-08-30 ENCOUNTER — Encounter (HOSPITAL_COMMUNITY)
Admission: RE | Admit: 2012-08-30 | Discharge: 2012-08-30 | Disposition: A | Discharge status: Home / Self Care | Payer: Medicare Other | Source: Ambulatory Visit | Attending: General Surgery | Admitting: General Surgery

## 2012-08-30 ENCOUNTER — Encounter (HOSPITAL_COMMUNITY): Payer: Self-pay

## 2012-08-30 DIAGNOSIS — Z01812 Encounter for preprocedural laboratory examination: Secondary | ICD-10-CM | POA: Insufficient documentation

## 2012-08-30 DIAGNOSIS — E119 Type 2 diabetes mellitus without complications: Secondary | ICD-10-CM | POA: Insufficient documentation

## 2012-08-30 DIAGNOSIS — E785 Hyperlipidemia, unspecified: Secondary | ICD-10-CM | POA: Insufficient documentation

## 2012-08-30 DIAGNOSIS — I517 Cardiomegaly: Secondary | ICD-10-CM | POA: Insufficient documentation

## 2012-08-30 DIAGNOSIS — E669 Obesity, unspecified: Secondary | ICD-10-CM | POA: Insufficient documentation

## 2012-08-30 DIAGNOSIS — Z01810 Encounter for preprocedural cardiovascular examination: Secondary | ICD-10-CM | POA: Insufficient documentation

## 2012-08-30 DIAGNOSIS — I10 Essential (primary) hypertension: Secondary | ICD-10-CM | POA: Insufficient documentation

## 2012-08-30 DIAGNOSIS — Z01818 Encounter for other preprocedural examination: Secondary | ICD-10-CM | POA: Insufficient documentation

## 2012-08-30 LAB — CBC
HCT: 36.1 % — ABNORMAL LOW (ref 39.0–52.0)
MCHC: 33.8 g/dL (ref 30.0–36.0)
MCV: 84.3 fL (ref 78.0–100.0)
RDW: 13 % (ref 11.5–15.5)

## 2012-08-30 LAB — BASIC METABOLIC PANEL
BUN: 15 mg/dL (ref 6–23)
CO2: 29 mEq/L (ref 19–32)
Chloride: 101 mEq/L (ref 96–112)
Creatinine, Ser: 0.98 mg/dL (ref 0.50–1.35)
Glucose, Bld: 154 mg/dL — ABNORMAL HIGH (ref 70–99)

## 2012-08-30 NOTE — Progress Notes (Signed)
08/30/12 0857  OBSTRUCTIVE SLEEP APNEA  Have you ever been diagnosed with sleep apnea through a sleep study? No  Do you snore loudly (loud enough to be heard through closed doors)?  0  Do you often feel tired, fatigued, or sleepy during the daytime? 0  Has anyone observed you stop breathing during your sleep? 0  Do you have, or are you being treated for high blood pressure? 1  BMI more than 35 kg/m2? 1  Neck circumference greater than 40 cm/18 inches? 1  Gender: 1  Obstructive Sleep Apnea Score 4  Score 4 or greater  Results sent to PCP

## 2012-08-30 NOTE — Progress Notes (Addendum)
Primary: Dr. Norberto Sorenson at [facility], [phone]. They have no ekg on file for pt.  Faxed risk for obstrucive sleep apnea screening tool to Dr. Alver Fisher office.

## 2012-08-30 NOTE — Pre-Procedure Instructions (Signed)
Nicholas Hardin  08/30/2012   Your procedure is scheduled on:  Wednesday September 06, 2012  Report to [facility] at 6:30 AM.  Call this number if you have problems the morning of surgery: [phone]   Remember:  Stop aspirin, coumadin, plavix,effient, and herbal medicines   Do not eat food or drink liquids after midnight.   Take these medicines the morning of surgery with A SIP OF WATER: atenolol   Do not wear jewelry, make-up or nail polish.  Do not wear lotions, powders, or perfumes. You may wear deodorant.  Do not shave 48 hours prior to surgery. Men may shave face and neck.  Do not bring valuables to the hospital.  [facility] is not responsible                   for any belongings or valuables.  Contacts, dentures or bridgework may not be worn into surgery.  Leave suitcase in the car. After surgery it may be brought to your room.  For patients admitted to the hospital, checkout time is 11:00 AM the day of  discharge.   Patients discharged the day of surgery will not be allowed to drive  home.  Name and phone number of your driver: Nicholas Hardin [phone] cell  Special Instructions: Shower using CHG 2 nights before surgery and the night before surgery.  If you shower the day of surgery use CHG.  Use special wash - you have one bottle of CHG for all showers.  You should use approximately 1/3 of the bottle for each shower.   Please read over the following fact sheets that you were given: Pain Booklet, Coughing and Deep Breathing and Surgical Site Infection Prevention

## 2012-08-31 NOTE — Progress Notes (Signed)
Anesthesia Chart Review:  Patient is a 67 year old male scheduled for laparoscopic cholecystectomy on 09/06/12 by Dr. Carolynne Edouard.  History includes obesity, non-smoker, HTN, HLD, DM2, cognitive deficits (not specified), dental surgery. OSA screening score was 4.  PCP is Dr. Norberto Sorenson.  EKG on 08/30/12 showed NSR, cannot rule out anterior infarct (age undetermined). There are no comparison EKGs in Epic/Muse or at his PCP office.  No CV symptoms documented at his PAT visit.  CXR on 08/30/12 showed stable mild cardiomegaly without acute findings.  Preoperative labs noted.  A1C on 06/16/12 was 7.0.    He will be evaluated by his assigned anesthesiologist on the day of surgery.  If he remains asymptomatic from a CV standpoint then I would anticipate that he could proceed as planned.  Velna Ochs [facility]/Anesthesiology Phone [phone] 08/31/2012 12:17 PM

## 2012-09-05 MED ORDER — CHLORHEXIDINE GLUCONATE 4 % EX LIQD: 1.0000 "application " | Freq: Once | CUTANEOUS | Status: DC

## 2012-09-05 MED ORDER — CEFAZOLIN SODIUM-DEXTROSE 2-3 GM-% IV SOLR
2.0000 g | INTRAVENOUS | Status: AC
  Administered 2012-09-06: 2 g via INTRAVENOUS
  Filled 2012-09-05: qty 50

## 2012-09-06 ENCOUNTER — Ambulatory Visit (HOSPITAL_COMMUNITY)
Admission: RE | Admit: 2012-09-06 | Discharge: 2012-09-07 | Disposition: A | Discharge status: Home / Self Care | Payer: Medicare Other | Source: Ambulatory Visit | Attending: General Surgery | Admitting: General Surgery

## 2012-09-06 ENCOUNTER — Ambulatory Visit (HOSPITAL_COMMUNITY): Payer: Medicare Other | Admitting: Anesthesiology

## 2012-09-06 ENCOUNTER — Encounter (HOSPITAL_COMMUNITY): Payer: Self-pay | Admitting: Vascular Surgery

## 2012-09-06 ENCOUNTER — Encounter (HOSPITAL_COMMUNITY): Payer: Self-pay | Admitting: *Deleted

## 2012-09-06 ENCOUNTER — Ambulatory Visit (HOSPITAL_COMMUNITY): Payer: Medicare Other

## 2012-09-06 ENCOUNTER — Encounter (HOSPITAL_COMMUNITY)
Admission: RE | Disposition: A | Discharge status: Home / Self Care | Payer: Self-pay | Source: Ambulatory Visit | Attending: General Surgery

## 2012-09-06 DIAGNOSIS — K8020 Calculus of gallbladder without cholecystitis without obstruction: Secondary | ICD-10-CM

## 2012-09-06 DIAGNOSIS — E119 Type 2 diabetes mellitus without complications: Secondary | ICD-10-CM | POA: Insufficient documentation

## 2012-09-06 DIAGNOSIS — Z79899 Other long term (current) drug therapy: Secondary | ICD-10-CM | POA: Insufficient documentation

## 2012-09-06 DIAGNOSIS — K8010 Calculus of gallbladder with chronic cholecystitis without obstruction: Secondary | ICD-10-CM

## 2012-09-06 DIAGNOSIS — I10 Essential (primary) hypertension: Secondary | ICD-10-CM | POA: Insufficient documentation

## 2012-09-06 HISTORY — PX: CHOLECYSTECTOMY: SHX55

## 2012-09-06 LAB — GLUCOSE, CAPILLARY
Glucose-Capillary: 144 mg/dL — ABNORMAL HIGH (ref 70–99)
Glucose-Capillary: 239 mg/dL — ABNORMAL HIGH (ref 70–99)

## 2012-09-06 SURGERY — LAPAROSCOPIC CHOLECYSTECTOMY WITH INTRAOPERATIVE CHOLANGIOGRAM
Anesthesia: General | Site: Abdomen | Wound class: Clean Contaminated

## 2012-09-06 MED ORDER — HYDROCHLOROTHIAZIDE 25 MG PO TABS
25.0000 mg | ORAL_TABLET | Freq: Every day | ORAL | Status: DC
  Administered 2012-09-07: 25 mg via ORAL
  Filled 2012-09-06: qty 1

## 2012-09-06 MED ORDER — SODIUM CHLORIDE 0.9 % IV SOLN
INTRAVENOUS | Status: DC | PRN
  Administered 2012-09-06: 10:00:00

## 2012-09-06 MED ORDER — LISINOPRIL 5 MG PO TABS
5.0000 mg | ORAL_TABLET | Freq: Every day | ORAL | Status: DC
  Administered 2012-09-07: 5 mg via ORAL
  Filled 2012-09-06: qty 1

## 2012-09-06 MED ORDER — HYDROCODONE-ACETAMINOPHEN 5-325 MG PO TABS: 1.0000 | ORAL_TABLET | ORAL | Status: DC | PRN

## 2012-09-06 MED ORDER — SODIUM CHLORIDE 0.9 % IR SOLN
Status: DC | PRN
  Administered 2012-09-06: 1

## 2012-09-06 MED ORDER — PROMETHAZINE HCL 25 MG/ML IJ SOLN
INTRAMUSCULAR | Status: AC
  Administered 2012-09-06: 6.25 mg via INTRAVENOUS
  Filled 2012-09-06: qty 1

## 2012-09-06 MED ORDER — HYDROMORPHONE HCL PF 1 MG/ML IJ SOLN: 0.2500 mg | INTRAMUSCULAR | Status: DC | PRN

## 2012-09-06 MED ORDER — MORPHINE SULFATE 4 MG/ML IJ SOLN: 4.0000 mg | INTRAMUSCULAR | Status: DC | PRN

## 2012-09-06 MED ORDER — NEOSTIGMINE METHYLSULFATE 1 MG/ML IJ SOLN
INTRAMUSCULAR | Status: DC | PRN
  Administered 2012-09-06: 3 mg via INTRAVENOUS

## 2012-09-06 MED ORDER — PROMETHAZINE HCL 25 MG/ML IJ SOLN: 6.2500 mg | INTRAMUSCULAR | Status: DC | PRN

## 2012-09-06 MED ORDER — ROCURONIUM BROMIDE 100 MG/10ML IV SOLN
INTRAVENOUS | Status: DC | PRN
  Administered 2012-09-06: 45 mg via INTRAVENOUS

## 2012-09-06 MED ORDER — BUPIVACAINE-EPINEPHRINE 0.25% -1:200000 IJ SOLN
INTRAMUSCULAR | Status: DC | PRN
  Administered 2012-09-06: 22 mL

## 2012-09-06 MED ORDER — FENTANYL CITRATE 0.05 MG/ML IJ SOLN
INTRAMUSCULAR | Status: DC | PRN
  Administered 2012-09-06: 50 ug via INTRAVENOUS
  Administered 2012-09-06: 100 ug via INTRAVENOUS
  Administered 2012-09-06: 50 ug via INTRAVENOUS

## 2012-09-06 MED ORDER — ONDANSETRON HCL 4 MG/2ML IJ SOLN: 4.0000 mg | Freq: Four times a day (QID) | INTRAMUSCULAR | Status: DC | PRN

## 2012-09-06 MED ORDER — PHENYLEPHRINE HCL 10 MG/ML IJ SOLN
INTRAMUSCULAR | Status: DC | PRN
  Administered 2012-09-06: 80 ug via INTRAVENOUS

## 2012-09-06 MED ORDER — PROPOFOL 10 MG/ML IV BOLUS
INTRAVENOUS | Status: DC | PRN
  Administered 2012-09-06: 100 mg via INTRAVENOUS

## 2012-09-06 MED ORDER — MEPERIDINE HCL 25 MG/ML IJ SOLN: 6.2500 mg | INTRAMUSCULAR | Status: DC | PRN

## 2012-09-06 MED ORDER — 0.9 % SODIUM CHLORIDE (POUR BTL) OPTIME
TOPICAL | Status: DC | PRN
  Administered 2012-09-06 (×2): 1000 mL

## 2012-09-06 MED ORDER — HYDROCODONE-ACETAMINOPHEN 5-325 MG PO TABS: 1.0000 | ORAL_TABLET | Freq: Four times a day (QID) | ORAL | Status: DC | PRN

## 2012-09-06 MED ORDER — KCL IN DEXTROSE-NACL 20-5-0.9 MEQ/L-%-% IV SOLN
INTRAVENOUS | Status: DC
  Administered 2012-09-06: 17:00:00 via INTRAVENOUS
  Filled 2012-09-06 (×2): qty 1000

## 2012-09-06 MED ORDER — LIDOCAINE HCL (CARDIAC) 20 MG/ML IV SOLN
INTRAVENOUS | Status: DC | PRN
  Administered 2012-09-06: 20 mg via INTRAVENOUS

## 2012-09-06 MED ORDER — MIDAZOLAM HCL 2 MG/2ML IJ SOLN: 0.5000 mg | Freq: Once | INTRAMUSCULAR | Status: DC | PRN

## 2012-09-06 MED ORDER — OXYCODONE HCL 5 MG PO TABS: 5.0000 mg | ORAL_TABLET | Freq: Once | ORAL | Status: DC | PRN

## 2012-09-06 MED ORDER — GLIMEPIRIDE 4 MG PO TABS
4.0000 mg | ORAL_TABLET | Freq: Two times a day (BID) | ORAL | Status: DC
  Administered 2012-09-06 – 2012-09-07 (×2): 4 mg via ORAL
  Filled 2012-09-06 (×6): qty 1

## 2012-09-06 MED ORDER — OXYCODONE HCL 5 MG/5ML PO SOLN: 5.0000 mg | Freq: Once | ORAL | Status: DC | PRN

## 2012-09-06 MED ORDER — LACTATED RINGERS IV SOLN
INTRAVENOUS | Status: DC | PRN
  Administered 2012-09-06 (×2): via INTRAVENOUS

## 2012-09-06 MED ORDER — GLYCOPYRROLATE 0.2 MG/ML IJ SOLN
INTRAMUSCULAR | Status: DC | PRN
  Administered 2012-09-06: 0.4 mg via INTRAVENOUS

## 2012-09-06 MED ORDER — ONDANSETRON HCL 4 MG/2ML IJ SOLN
INTRAMUSCULAR | Status: DC | PRN
  Administered 2012-09-06: 4 mg via INTRAVENOUS

## 2012-09-06 MED ORDER — ATENOLOL 50 MG PO TABS
50.0000 mg | ORAL_TABLET | Freq: Every day | ORAL | Status: DC
  Administered 2012-09-07: 50 mg via ORAL
  Filled 2012-09-06: qty 1

## 2012-09-06 MED ORDER — BUPIVACAINE-EPINEPHRINE PF 0.25-1:200000 % IJ SOLN
INTRAMUSCULAR | Status: AC
  Filled 2012-09-06: qty 30

## 2012-09-06 MED ORDER — ONDANSETRON HCL 4 MG PO TABS: 4.0000 mg | ORAL_TABLET | Freq: Four times a day (QID) | ORAL | Status: DC | PRN

## 2012-09-06 SURGICAL SUPPLY — 39 items
APPLIER CLIP ROT 10 11.4 M/L (STAPLE) ×2
BLADE SURG ROTATE 9660 (MISCELLANEOUS) IMPLANT
CANISTER SUCTION 2500CC (MISCELLANEOUS) ×2 IMPLANT
CATH REDDICK CHOLANGI 4FR 50CM (CATHETERS) ×2 IMPLANT
CHLORAPREP W/TINT 26ML (MISCELLANEOUS) ×2 IMPLANT
CLIP APPLIE ROT 10 11.4 M/L (STAPLE) ×1 IMPLANT
CLOTH BEACON ORANGE TIMEOUT ST (SAFETY) ×2 IMPLANT
COVER MAYO STAND STRL (DRAPES) ×2 IMPLANT
COVER SURGICAL LIGHT HANDLE (MISCELLANEOUS) ×2 IMPLANT
DECANTER SPIKE VIAL GLASS SM (MISCELLANEOUS) ×4 IMPLANT
DERMABOND ADVANCED (GAUZE/BANDAGES/DRESSINGS) ×1
DERMABOND ADVANCED .7 DNX12 (GAUZE/BANDAGES/DRESSINGS) ×1 IMPLANT
DRAPE C-ARM 42X72 X-RAY (DRAPES) ×2 IMPLANT
DRAPE UTILITY 15X26 W/TAPE STR (DRAPE) ×4 IMPLANT
ELECT REM PT RETURN 9FT ADLT (ELECTROSURGICAL) ×2
ELECTRODE REM PT RTRN 9FT ADLT (ELECTROSURGICAL) ×1 IMPLANT
GLOVE BIO SURGEON STRL SZ7.5 (GLOVE) ×4 IMPLANT
GLOVE BIOGEL PI IND STRL 7.0 (GLOVE) ×1 IMPLANT
GLOVE BIOGEL PI IND STRL 7.5 (GLOVE) ×1 IMPLANT
GLOVE BIOGEL PI INDICATOR 7.0 (GLOVE) ×1
GLOVE BIOGEL PI INDICATOR 7.5 (GLOVE) ×1
GOWN STRL NON-REIN LRG LVL3 (GOWN DISPOSABLE) ×8 IMPLANT
IV CATH 14GX2 1/4 (CATHETERS) ×2 IMPLANT
KIT BASIN OR (CUSTOM PROCEDURE TRAY) ×2 IMPLANT
KIT ROOM TURNOVER OR (KITS) ×2 IMPLANT
NS IRRIG 1000ML POUR BTL (IV SOLUTION) ×2 IMPLANT
PAD ARMBOARD 7.5X6 YLW CONV (MISCELLANEOUS) ×2 IMPLANT
POUCH SPECIMEN RETRIEVAL 10MM (ENDOMECHANICALS) ×2 IMPLANT
SCISSORS LAP 5X35 DISP (ENDOMECHANICALS) IMPLANT
SET IRRIG TUBING LAPAROSCOPIC (IRRIGATION / IRRIGATOR) ×2 IMPLANT
SLEEVE ENDOPATH XCEL 5M (ENDOMECHANICALS) ×2 IMPLANT
SPECIMEN JAR SMALL (MISCELLANEOUS) ×2 IMPLANT
SUT MNCRL AB 4-0 PS2 18 (SUTURE) ×2 IMPLANT
TOWEL OR 17X24 6PK STRL BLUE (TOWEL DISPOSABLE) ×2 IMPLANT
TOWEL OR 17X26 10 PK STRL BLUE (TOWEL DISPOSABLE) ×2 IMPLANT
TRAY LAPAROSCOPIC (CUSTOM PROCEDURE TRAY) ×2 IMPLANT
TROCAR XCEL BLUNT TIP 100MML (ENDOMECHANICALS) ×2 IMPLANT
TROCAR XCEL NON-BLD 11X100MML (ENDOMECHANICALS) ×2 IMPLANT
TROCAR XCEL NON-BLD 5MMX100MML (ENDOMECHANICALS) ×2 IMPLANT

## 2012-09-06 NOTE — Anesthesia Preprocedure Evaluation (Addendum)
Anesthesia Evaluation  Patient identified by MRN, date of birth, ID band Patient awake    Reviewed: Allergy & Precautions, H&P , NPO status , Patient's Chart, lab work & pertinent test results, reviewed documented beta blocker date and time   History of Anesthesia Complications Negative for: history of anesthetic complications  Airway Mallampati: II TM Distance: >3 FB Neck ROM: Full    Dental  (+) Edentulous Upper and Edentulous Lower   Pulmonary neg pulmonary ROS,  breath sounds clear to auscultation  Pulmonary exam normal       Cardiovascular hypertension, Pt. on medications and Pt. on home beta blockers Rhythm:Regular Rate:Normal     Neuro/Psych Mental deficit, has caretaker    GI/Hepatic negative GI ROS, Neg liver ROS,   Endo/Other  diabetes (glu 144), Type 2, Oral Hypoglycemic AgentsMorbid obesity  Renal/GU negative Renal ROS     Musculoskeletal   Abdominal (+) + obese,   Peds  Hematology   Anesthesia Other Findings   Reproductive/Obstetrics                          Anesthesia Physical Anesthesia Plan  ASA: III  Anesthesia Plan: General   Post-op Pain Management:    Induction: Intravenous  Airway Management Planned: Oral ETT  Additional Equipment:   Intra-op Plan:   Post-operative Plan: Extubation in OR  Informed Consent:   Plan Discussed with: Surgeon and CRNA  Anesthesia Plan Comments: (Plan routine monitors, GETA)        Anesthesia Quick Evaluation

## 2012-09-06 NOTE — Preoperative (Signed)
Beta Blockers   Reason not to administer Beta Blockers:Not Applicable 

## 2012-09-06 NOTE — OR Nursing (Signed)
Pt was nauseated and vomited about 20 cc of bile emesis. Phenergan 6.25mg  given.

## 2012-09-06 NOTE — Progress Notes (Signed)
Per family member aunt puts medication in medication planner. Patient knows that he took Atenolol and when he stopped the ASA otherwise he was unsure

## 2012-09-06 NOTE — Transfer of Care (Signed)
Immediate Anesthesia Transfer of Care Note  Patient: Nicholas Hardin  Procedure(s) Performed: Procedure(s): LAPAROSCOPIC CHOLECYSTECTOMY WITH INTRAOPERATIVE CHOLANGIOGRAM (N/A)  Patient Location: PACU  Anesthesia Type:General  Level of Consciousness: awake, sedated and patient cooperative  Airway & Oxygen Therapy: Patient Spontanous Breathing and Patient connected to nasal cannula oxygen  Post-op Assessment: Report given to PACU RN, Post -op Vital signs reviewed and stable and Patient moving all extremities  Post vital signs: Reviewed and stable  Complications: No apparent anesthesia complications

## 2012-09-06 NOTE — Anesthesia Postprocedure Evaluation (Signed)
  Anesthesia Post-op Note  Patient: Nicholas Hardin  Procedure(s) Performed: Procedure(s): LAPAROSCOPIC CHOLECYSTECTOMY WITH INTRAOPERATIVE CHOLANGIOGRAM (N/A)  Patient Location: PACU  Anesthesia Type:General  Level of Consciousness: awake, alert , oriented and patient cooperative  Airway and Oxygen Therapy: Patient Spontanous Breathing and Patient connected to nasal cannula oxygen  Post-op Pain: none  Post-op Assessment: Post-op Vital signs reviewed, Patient's Cardiovascular Status Stable, Respiratory Function Stable, Patent Airway, No signs of Nausea or vomiting and Pain level controlled  Post-op Vital Signs: Reviewed and stable  Complications: No apparent anesthesia complications

## 2012-09-06 NOTE — OR Nursing (Signed)
Verified with pt's aunt/caretaker whether he took  hypoglcemic agents this, she affirms he did not. Will give this am dose of amaryl per order dr Jean Rosenthal

## 2012-09-06 NOTE — Interval H&P Note (Signed)
History and Physical Interval Note:  09/06/2012 7:45 AM  Nicholas Hardin  has presented today for surgery, with the diagnosis of gallstones   The various methods of treatment have been discussed with the patient and family. After consideration of risks, benefits and other options for treatment, the patient has consented to  Procedure(s): LAPAROSCOPIC CHOLECYSTECTOMY WITH INTRAOPERATIVE CHOLANGIOGRAM (N/A) as a surgical intervention .  The patient's history has been reviewed, patient examined, no change in status, stable for surgery.  I have reviewed the patient's chart and labs.  Questions were answered to the patient's satisfaction.     TOTH III,PAUL S

## 2012-09-06 NOTE — H&P (View-Only) (Signed)
Subjective:     Patient ID: Nicholas Hardin, male   DOB: 1945-06-24, 67 y.o.   MRN: [id number]  HPI We're asked to see the patient in consultation by Dr. Clelia Croft to evaluate him for gallstones. The patient is a 67 year old white male who has been having epigastric and right upper quadrant pain off and on for the last 6 months. The pain occurs 2-3 times per week and last for several hours. He has had no nausea or vomiting associated with it. He does have some diarrhea associated with it. As part of his workup he had a chest x-ray that suggested there may be stones in the gallbladder.  Review of Systems  Constitutional: Negative.   HENT: Negative.   Eyes: Negative.   Respiratory: Negative.   Cardiovascular: Negative.   Gastrointestinal: Positive for abdominal pain and diarrhea.  Endocrine: Negative.   Genitourinary: Negative.   Musculoskeletal: Negative.   Skin: Negative.   Allergic/Immunologic: Negative.   Neurological: Negative.   Hematological: Negative.   Psychiatric/Behavioral: Negative.        Objective:   Physical Exam  Constitutional: He is oriented to person, place, and time. He appears well-developed and well-nourished.  HENT:  Head: Normocephalic and atraumatic.  Eyes: Conjunctivae and EOM are normal. Pupils are equal, round, and reactive to light.  Neck: Normal range of motion. Neck supple.  Cardiovascular: Normal rate, regular rhythm and normal heart sounds.   Pulmonary/Chest: Effort normal and breath sounds normal.  Abdominal: Soft. Bowel sounds are normal.  There is mild epigastric and right upper quadrant discomfort with palpation. There is no palpable mass.  Musculoskeletal: Normal range of motion.  Neurological: He is alert and oriented to person, place, and time.  Skin: Skin is warm and dry.  Psychiatric: He has a normal mood and affect. His behavior is normal.       Assessment:     The patient has some symptoms that could be consistent with biliary colic.  Unfortunately he has not had an ultrasound to confirm that these are gallstones were seen on his chest x-ray.     Plan:     We will start by obtaining an ultrasound of his abdomen. If this shows gallstones and I think he is a candidate for laparoscopic cholecystectomy. I've discussed with him in detail the risks and benefits of the operation as well as some of the technical aspects and he understands and wishes to proceed

## 2012-09-06 NOTE — Op Note (Signed)
09/06/2012  9:45 AM  PATIENT:  Nicholas Hardin  67 y.o. male  PRE-OPERATIVE DIAGNOSIS:  gallstones   POST-OPERATIVE DIAGNOSIS:  gallstones   PROCEDURE:  Procedure(s): LAPAROSCOPIC CHOLECYSTECTOMY WITH INTRAOPERATIVE CHOLANGIOGRAM (N/A)  SURGEON:  Surgeon(s) and Role:    * Robyne Askew, MD - Primary  PHYSICIAN ASSISTANT:   ASSISTANTS: Dr. Donell Beers   ANESTHESIA:   general  EBL:  Total I/O In: 1000 [I.V.:1000] Out: -   BLOOD ADMINISTERED:none  DRAINS: none   LOCAL MEDICATIONS USED:  MARCAINE     SPECIMEN:  Source of Specimen:  gallbladder  DISPOSITION OF SPECIMEN:  PATHOLOGY  COUNTS:  YES  TOURNIQUET:  * No tourniquets in log *  DICTATION: .Dragon Dictation  Procedure: After informed consent was obtained the patient was brought to the operating room and placed in the supine position on the operating room table. After adequate induction of general anesthesia the patient's abdomen was prepped with ChloraPrep allowed to dry and draped in usual sterile manner. The area below the umbilicus was infiltrated with quarter percent  Marcaine. A small incision was made with a 15 blade knife. The incision was carried down through the subcutaneous tissue bluntly with a hemostat and [employer] retractors. The linea alba was identified. The linea alba was incised with a 15 blade knife and each side was grasped with Coker clamps. The preperitoneal space was then probed with a hemostat until the peritoneum was opened and access was gained to the abdominal cavity. A 0 Vicryl pursestring stitch was placed in the fascia surrounding the opening. A Hassan cannula was then placed through the opening and anchored in place with the previously placed Vicryl purse string stitch. The abdomen was insufflated with carbon dioxide without difficulty. A laparoscope was inserted through the [facility] cannula in the right upper quadrant was inspected. Next the epigastric region was infiltrated with % Marcaine. A small  incision was made with a 15 blade knife. A 10 mm port was placed bluntly through this incision into the abdominal cavity under direct vision. Next 2 sites were chosen laterally on the right side of the abdomen for placement of 5 mm ports. Each of these areas was infiltrated with quarter percent Marcaine. Small stab incisions were made with a 15 blade knife. 5 mm ports were then placed bluntly through these incisions into the abdominal cavity under direct vision without difficulty. A blunt grasper was placed through the lateralmost 5 mm port and used to grasp the dome of the gallbladder and elevated anteriorly and superiorly. Another blunt grasper was placed through the other 5 mm port and used to retract the body and neck of the gallbladder. A dissector was placed through the epigastric port and using the electrocautery the peritoneal reflection at the gallbladder neck was opened. Blunt dissection was then carried out in this area until the gallbladder neck-cystic duct junction was readily identified and a good window was created. A single clip was placed on the gallbladder neck. A small  ductotomy was made just below the clip with laparoscopic scissors. A 14-gauge Angiocath was then placed through the anterior abdominal wall under direct vision. A Reddick cholangiogram catheter was then placed through the Angiocath and flushed. The catheter was then placed in the cystic duct and anchored in place with a clip. A cholangiogram was obtained that showed no filling defects good emptying into the duodenum an adequate length on the cystic duct. The anchoring clip and catheters were then removed from the patient. 3 clips were  placed proximally on the cystic duct and the duct was divided between the 2 sets of clips. Posterior to this the cystic artery was identified and again dissected bluntly in a circumferential manner until a good window  was created. 2 clips were placed proximally and one distally on the artery and the  artery was divided between the 2 sets of clips. Next a laparoscopic hook cautery device was used to separate the gallbladder from the liver bed. Prior to completely detaching the gallbladder from the liver bed the liver bed was inspected and several small bleeding points were coagulated with the electrocautery until the area was completely hemostatic. The gallbladder was then detached the rest of it from the liver bed without difficulty. A laparoscopic bag was inserted through the epigastric port. The gallbladder was placed within the bag and the bag was sealed. A laparoscope was then moved to the epigastric port. The gallbladder grasper was placed through the [facility] cannula and used to grasp the opening of the bag. The bag with the gallbladder was then removed with the [facility] cannula through the infraumbilical port without difficulty. The fascial defect was then closed with the previously placed Vicryl pursestring stitch as well as with another figure-of-eight 0 Vicryl stitch. The liver bed was inspected again and found to be hemostatic. The abdomen was irrigated with copious amounts of saline until the effluent was clear. The ports were then removed under direct vision without difficulty and were found to be hemostatic. The gas was allowed to escape. The skin incisions were all closed with interrupted 4-0 Monocryl subcuticular stitches. Dermabond dressings were applied. The patient tolerated the procedure well. At the end of the case all needle sponge and instrument counts were correct. The patient was then awakened and taken to recovery in stable condition  PLAN OF CARE: Discharge to home after PACU  PATIENT DISPOSITION:  PACU - hemodynamically stable.   Delay start of Pharmacological VTE agent (>24hrs) due to surgical blood loss or risk of bleeding: not applicable

## 2012-09-07 ENCOUNTER — Encounter (HOSPITAL_COMMUNITY): Payer: Self-pay | Admitting: General Surgery

## 2012-09-07 NOTE — Progress Notes (Signed)
1 Day Post-Op  Subjective: No complaints  Objective: Vital signs in last 24 hours: Temp:  [97.5 F (36.4 C)-98.4 F (36.9 C)] 97.9 F (36.6 C) (08/07 0508) Pulse Rate:  [26-62] 62 (08/07 0508) Resp:  [10-18] 18 (08/07 0508) BP: (100-130)/(37-61) 118/49 mmHg (08/07 0508) SpO2:  [94 %-100 %] 98 % (08/07 0508) Weight:  [226 lb 9.2 oz (102.775 kg)] 226 lb 9.2 oz (102.775 kg) (08/07 0755) Last BM Date: 09/05/12  Intake/Output from previous day: 08/06 0701 - 08/07 0700 In: 1961.7 [P.O.:120; I.V.:1841.7] Out: 400 [Urine:400] Intake/Output this shift:    GI: soft, nontender. incisions look good  Lab Results:  No results found for this basename: WBC, HGB, HCT, PLT,  in the last 72 hours BMET No results found for this basename: NA, K, CL, CO2, GLUCOSE, BUN, CREATININE, CALCIUM,  in the last 72 hours PT/INR No results found for this basename: LABPROT, INR,  in the last 72 hours ABG No results found for this basename: PHART, PCO2, PO2, HCO3,  in the last 72 hours  Studies/Results: Dg Cholangiogram Operative  09/06/2012   *RADIOLOGY REPORT*  Clinical Data: Gallstones, laparoscopic cholecystectomy  INTRAOPERATIVE CHOLANGIOGRAM  Technique:  Multiple fluoroscopic spot radiographs were obtained during intraoperative cholangiogram and are submitted for interpretation post-operatively.  Comparison: Ultrasound of the abdomen of 08/15/2012  Findings: Contrast was injected via the cystic duct.  There is good filling of the common bile duct.  No filling defect is seen and there is free passage of contrast into the duodenal loop.  IMPRESSION: Negative intraoperative cholangiogram.   Original Report Authenticated By: Dwyane Dee, M.D.    Anti-infectives: Anti-infectives   Start     Dose/Rate Route Frequency Ordered Stop   09/06/12 0600  ceFAZolin (ANCEF) IVPB 2 g/50 mL premix     2 g 100 mL/hr over 30 Minutes Intravenous On call to O.R. 09/05/12 1441 09/06/12 0845      Assessment/Plan: s/p  Procedure(s): LAPAROSCOPIC CHOLECYSTECTOMY WITH INTRAOPERATIVE CHOLANGIOGRAM (N/A) Advance diet Discharge  LOS: 1 day    TOTH III,Lamara Brecht S 09/07/2012

## 2012-09-07 NOTE — Discharge Summary (Signed)
Physician Discharge Summary  Patient ID: Nicholas Hardin MRN: [id number] DOB/AGE: [birth year] 67 y.o.  Admit date: 09/06/2012 Discharge date: 09/07/2012  Admission Diagnoses:  Discharge Diagnoses:  Active Problems:   * No active hospital problems. *   Discharged Condition: good  Hospital Course: the pt underwent lap chole. He tolerated surgery well. He had no one to watch him at home so he stayed overnight. He did well and is ready for discharge home  Consults: None  Significant Diagnostic Studies: none  Treatments: surgery: as above  Discharge Exam: Blood pressure 118/49, pulse 62, temperature 97.9 F (36.6 C), temperature source Oral, resp. rate 18, height 5\' 5"  (1.651 m), weight 226 lb 9.2 oz (102.775 kg), SpO2 98.00%. GI: soft, nontender  Disposition:   Discharge Orders   Future Appointments Provider Department Dept Phone   12/15/2012 10:15 AM Sherren Mocha, MD [facility] [phone]   Future Orders Complete By Expires     Call MD for:  difficulty breathing, headache or visual disturbances  As directed     Call MD for:  difficulty breathing, headache or visual disturbances  As directed     Call MD for:  extreme fatigue  As directed     Call MD for:  extreme fatigue  As directed     Call MD for:  hives  As directed     Call MD for:  hives  As directed     Call MD for:  persistant dizziness or light-headedness  As directed     Call MD for:  persistant dizziness or light-headedness  As directed     Call MD for:  persistant nausea and vomiting  As directed     Call MD for:  persistant nausea and vomiting  As directed     Call MD for:  redness, tenderness, or signs of infection (pain, swelling, redness, odor or green/yellow discharge around incision site)  As directed     Call MD for:  redness, tenderness, or signs of infection (pain, swelling, redness, odor or green/yellow discharge around incision site)  As directed     Call MD for:  severe uncontrolled pain  As  directed     Call MD for:  severe uncontrolled pain  As directed     Call MD for:  temperature >100.4  As directed     Call MD for:  temperature >100.4  As directed     Diet - low sodium heart healthy  As directed     Diet - low sodium heart healthy  As directed     Discharge instructions  As directed     Comments:      May shower. No heavy lifting. Low fat diet    Discharge instructions  As directed     Comments:      May shower. No heavy lifting. Low fat diet    Increase activity slowly  As directed     Increase activity slowly  As directed     No wound care  As directed     No wound care  As directed         Medication List         ammonium lactate 12 % cream  Commonly known as:  AMLACTIN  Apply 1 Bottle topically daily as needed (apply to sores on legs for healing).     aspirin 81 MG chewable tablet  Chew 81 mg by mouth at bedtime.     atenolol 50  MG tablet  Commonly known as:  TENORMIN  Take 50 mg by mouth daily.     B-12 500 MCG Tabs  Take 500 mcg by mouth daily.     ferrous sulfate 325 (65 FE) MG tablet  Take 325 mg by mouth See admin instructions. Take 1 tablet on Sunday, Tuesday, Thursday, saturday     glimepiride 4 MG tablet  Commonly known as:  AMARYL  Take 4 mg by mouth 2 (two) times daily.     hydrochlorothiazide 25 MG tablet  Commonly known as:  HYDRODIURIL  Take 25 mg by mouth daily.     HYDROcodone-acetaminophen 5-325 MG per tablet  Commonly known as:  NORCO  Take 1-2 tablets by mouth every 6 (six) hours as needed for pain.     lisinopril 5 MG tablet  Commonly known as:  PRINIVIL,ZESTRIL  Take 5 mg by mouth daily.     loperamide 2 MG tablet  Commonly known as:  IMODIUM A-D  Take 2 mg by mouth 2 (two) times daily as needed for diarrhea or loose stools.     multivitamin tablet  Take 1 tablet by mouth daily.     OVER THE COUNTER MEDICATION  Take 1 tablet by mouth at bedtime. "Leg cramps pill"     simvastatin 20 MG tablet  Commonly known  as:  ZOCOR  Take 20 mg by mouth at bedtime.           Follow-up Information   Follow up with TOTH III,Liliana Dang S, MD In 3 weeks.   Contact information:   [address] [place] [address] [phone]       Signed: Robyne Askew 09/07/2012, 9:13 AM

## 2012-09-26 ENCOUNTER — Encounter (INDEPENDENT_AMBULATORY_CARE_PROVIDER_SITE_OTHER): Payer: Medicare Other | Admitting: General Surgery

## 2012-09-27 ENCOUNTER — Encounter (INDEPENDENT_AMBULATORY_CARE_PROVIDER_SITE_OTHER): Payer: Medicare Other | Admitting: General Surgery

## 2012-10-10 ENCOUNTER — Encounter (INDEPENDENT_AMBULATORY_CARE_PROVIDER_SITE_OTHER): Payer: Self-pay | Admitting: General Surgery

## 2012-10-10 ENCOUNTER — Ambulatory Visit (INDEPENDENT_AMBULATORY_CARE_PROVIDER_SITE_OTHER): Payer: Medicare Other | Admitting: General Surgery

## 2012-10-10 VITALS — BP 140/80 | HR 78 | Resp 16 | Ht 63.0 in | Wt 239.6 lb

## 2012-10-10 DIAGNOSIS — K8020 Calculus of gallbladder without cholecystitis without obstruction: Secondary | ICD-10-CM

## 2012-10-10 NOTE — Progress Notes (Signed)
Subjective:     Patient ID: Nicholas Hardin, male   DOB: 1945-10-23, 67 y.o.   MRN: [id number]  HPI The patient is a 67 year old white male who is one month status post laparoscopic cholecystectomy for cholecystitis with cholelithiasis. He has done well. He denies any abdominal pain. He occasionally will have 2 or 3 loose stools a day but this seems to be improving slowly.  Review of Systems     Objective:   Physical Exam On exam his abdomen is soft and nontender. His incisions are all healing nicely with no sign of infection.    Assessment:     The patient is one month status post laparoscopic cholecystectomy     Plan:     At this point he may return to all normal activities without restrictions. We will plan to see him back on a when necessary basis.

## 2012-10-10 NOTE — Patient Instructions (Signed)
May return to all normal activities 

## 2012-11-17 ENCOUNTER — Ambulatory Visit (INDEPENDENT_AMBULATORY_CARE_PROVIDER_SITE_OTHER): Payer: Medicare Other

## 2012-11-17 VITALS — BP 138/67 | HR 54 | Resp 16 | Ht 66.0 in | Wt 244.0 lb

## 2012-11-17 DIAGNOSIS — I872 Venous insufficiency (chronic) (peripheral): Secondary | ICD-10-CM

## 2012-11-17 DIAGNOSIS — E1140 Type 2 diabetes mellitus with diabetic neuropathy, unspecified: Secondary | ICD-10-CM

## 2012-11-17 DIAGNOSIS — I83011 Varicose veins of right lower extremity with ulcer of thigh: Secondary | ICD-10-CM

## 2012-11-17 DIAGNOSIS — M79609 Pain in unspecified limb: Secondary | ICD-10-CM

## 2012-11-17 DIAGNOSIS — E1149 Type 2 diabetes mellitus with other diabetic neurological complication: Secondary | ICD-10-CM

## 2012-11-17 DIAGNOSIS — E1142 Type 2 diabetes mellitus with diabetic polyneuropathy: Secondary | ICD-10-CM

## 2012-11-17 DIAGNOSIS — B351 Tinea unguium: Secondary | ICD-10-CM

## 2012-11-17 NOTE — Progress Notes (Signed)
  Subjective:    Patient ID: Nicholas Hardin, male    DOB: [birth year], 67 y.o.   MRN: [id number] "Trim my toenails."   HPI patient is thick brittle crumbly dystrophic nails 1 through 5 bilateral, no changes in health history or medications at this time.    Review of Systems not reviewed at this visit     Objective:   Physical Exam Vascular status as follows patient does have palpable pulses DP postal for PT nonpalpable bilateral there is +1 edema bilateral with venous stasis both lower extremities there is an ulcer posterior Area of the right leg with mild serous discharge weeping this is to redress this time with Neosporin and a gauze wrap. May recommendations to follow up with the pain center, would likely benefit from [facility] boot treatment in followup. 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 Will followup with the pain vascular center for a venous stasis ulceration and venous insufficiency and edema.  Alvan Dame DPM

## 2012-11-17 NOTE — Patient Instructions (Signed)

## 2012-12-15 ENCOUNTER — Encounter: Payer: Self-pay | Admitting: Family Medicine

## 2012-12-15 ENCOUNTER — Ambulatory Visit (INDEPENDENT_AMBULATORY_CARE_PROVIDER_SITE_OTHER): Payer: Medicare Other | Admitting: Family Medicine

## 2012-12-15 ENCOUNTER — Other Ambulatory Visit: Payer: Self-pay | Admitting: Family Medicine

## 2012-12-15 VITALS — BP 119/54 | HR 60 | Temp 97.5°F | Resp 16 | Ht 64.0 in | Wt 231.0 lb

## 2012-12-15 DIAGNOSIS — E1149 Type 2 diabetes mellitus with other diabetic neurological complication: Secondary | ICD-10-CM

## 2012-12-15 DIAGNOSIS — E785 Hyperlipidemia, unspecified: Secondary | ICD-10-CM

## 2012-12-15 DIAGNOSIS — Z23 Encounter for immunization: Secondary | ICD-10-CM

## 2012-12-15 DIAGNOSIS — E1143 Type 2 diabetes mellitus with diabetic autonomic (poly)neuropathy: Secondary | ICD-10-CM | POA: Insufficient documentation

## 2012-12-15 DIAGNOSIS — R269 Unspecified abnormalities of gait and mobility: Secondary | ICD-10-CM

## 2012-12-15 DIAGNOSIS — E1151 Type 2 diabetes mellitus with diabetic peripheral angiopathy without gangrene: Secondary | ICD-10-CM | POA: Insufficient documentation

## 2012-12-15 DIAGNOSIS — E781 Pure hyperglyceridemia: Secondary | ICD-10-CM

## 2012-12-15 DIAGNOSIS — F39 Unspecified mood [affective] disorder: Secondary | ICD-10-CM

## 2012-12-15 DIAGNOSIS — D649 Anemia, unspecified: Secondary | ICD-10-CM

## 2012-12-15 DIAGNOSIS — R5381 Other malaise: Secondary | ICD-10-CM

## 2012-12-15 DIAGNOSIS — I83009 Varicose veins of unspecified lower extremity with ulcer of unspecified site: Secondary | ICD-10-CM

## 2012-12-15 DIAGNOSIS — G909 Disorder of the autonomic nervous system, unspecified: Secondary | ICD-10-CM

## 2012-12-15 DIAGNOSIS — Z0000 Encounter for general adult medical examination without abnormal findings: Secondary | ICD-10-CM

## 2012-12-15 DIAGNOSIS — E538 Deficiency of other specified B group vitamins: Secondary | ICD-10-CM | POA: Insufficient documentation

## 2012-12-15 DIAGNOSIS — I10 Essential (primary) hypertension: Secondary | ICD-10-CM

## 2012-12-15 DIAGNOSIS — E1159 Type 2 diabetes mellitus with other circulatory complications: Secondary | ICD-10-CM

## 2012-12-15 DIAGNOSIS — Z79899 Other long term (current) drug therapy: Secondary | ICD-10-CM | POA: Insufficient documentation

## 2012-12-15 DIAGNOSIS — F70 Mild intellectual disabilities: Secondary | ICD-10-CM

## 2012-12-15 DIAGNOSIS — IMO0001 Reserved for inherently not codable concepts without codable children: Secondary | ICD-10-CM

## 2012-12-15 LAB — CBC WITH DIFFERENTIAL/PLATELET
Basophils Relative: 0 % (ref 0–1)
Eosinophils Absolute: 0.3 10*3/uL (ref 0.0–0.7)
HCT: 34.2 % — ABNORMAL LOW (ref 39.0–52.0)
Hemoglobin: 12 g/dL — ABNORMAL LOW (ref 13.0–17.0)
MCH: 29.2 pg (ref 26.0–34.0)
MCHC: 35.1 g/dL (ref 30.0–36.0)
Monocytes Absolute: 0.8 10*3/uL (ref 0.1–1.0)
Monocytes Relative: 12 % (ref 3–12)
RDW: 13.9 % (ref 11.5–15.5)

## 2012-12-15 MED ORDER — GLIMEPIRIDE 4 MG PO TABS: 4.0000 mg | ORAL_TABLET | Freq: Two times a day (BID) | ORAL | Status: DC

## 2012-12-15 MED ORDER — ZOSTER VACCINE LIVE 19400 UNT/0.65ML ~~LOC~~ SOLR: 0.6500 mL | Freq: Once | SUBCUTANEOUS | Status: DC

## 2012-12-15 MED ORDER — SIMVASTATIN 20 MG PO TABS: 20.0000 mg | ORAL_TABLET | Freq: Every day | ORAL | Status: DC

## 2012-12-15 MED ORDER — ATENOLOL 25 MG PO TABS: 25.0000 mg | ORAL_TABLET | Freq: Every day | ORAL | Status: DC

## 2012-12-15 MED ORDER — SERTRALINE HCL 25 MG PO TABS: 25.0000 mg | ORAL_TABLET | Freq: Every day | ORAL | Status: DC

## 2012-12-15 MED ORDER — LISINOPRIL-HYDROCHLOROTHIAZIDE 10-12.5 MG PO TABS: 1.0000 | ORAL_TABLET | Freq: Every day | ORAL | Status: DC

## 2012-12-15 NOTE — Patient Instructions (Addendum)
We will change your lisinopril 5 mg and hctz 25mg  to the combo pill of lisinopril-hctz 10/12.5 mg so that you are on one less pill a day (with one less copay). We decreased your atenolol from 50mg  to 25mg  so you can cut what you currently have in half until you get your new prescription. Start the sertraline every morning- call if you have any side effects. Diabetes is doing better, continue on your current diabetes medication. We will see you back in 3d (monday) for your fasting lab work and recheck on your legs.  Keeping you healthy  Get these tests  Blood pressure- Have your blood pressure checked once a year by your healthcare provider.  Normal blood pressure is 120/80  Weight- Have your body mass index (BMI) calculated to screen for obesity.  BMI is a measure of body fat based on height and weight. You can also calculate your own BMI at [email or web address].  Cholesterol- Have your cholesterol checked every year.  Diabetes- Have your blood sugar checked regularly if you have high blood pressure, high cholesterol, have a family history of diabetes or if you are overweight.  Screening for Colon Cancer- Colonoscopy starting at age 15.  Screening may begin sooner depending on your family history and other health conditions. Follow up colonoscopy as directed by your Gastroenterologist.  Screening for Prostate Cancer- Both blood work (PSA) and a rectal exam help screen for Prostate Cancer.  Screening begins at age 57 with African-American men and at age 14 with Caucasian men.  Screening may begin sooner depending on your family history.  Take these medicines  Aspirin- One aspirin daily can help prevent Heart disease and Stroke.  Flu shot- Every fall.  Tetanus- Every 10 years.  Zostavax- Once after the age of 61 to prevent Shingles.  Pneumonia shot- Once after the age of 67; if you are younger than 77, ask your healthcare provider if you need a Pneumonia shot.  Take these  steps  Don't smoke- If you do smoke, talk to your doctor about quitting.  For tips on how to quit, go to www.smokefree.gov or call 1-800-QUIT-NOW.  Be physically active- Exercise 5 days a week for at least 30 minutes.  If you are not already physically active start slow and gradually work up to 30 minutes of moderate physical activity.  Examples of moderate activity include walking briskly, mowing the yard, dancing, swimming, bicycling, etc.  Eat a healthy diet- Eat a variety of healthy food such as fruits, vegetables, low fat milk, low fat cheese, yogurt, lean meant, poultry, fish, beans, tofu, etc. For more information go to www.thenutritionsource.org  Drink alcohol in moderation- Limit alcohol intake to less than two drinks a day. Never drink and drive.  Dentist- Brush and floss twice daily; visit your dentist twice a year.  Depression- Your emotional health is as important as your physical health. If you're feeling down, or losing interest in things you would normally enjoy please talk to your healthcare provider.  Eye exam- Visit your eye doctor every year.  Safe sex- If you may be exposed to a sexually transmitted infection, use a condom.  Seat belts- Seat belts can save your life; always wear one.  Smoke/Carbon Monoxide detectors- These detectors need to be installed on the appropriate level of your home.  Replace batteries at least once a year.  Skin cancer- When out in the sun, cover up and use sunscreen 15 SPF or higher.  Violence- If anyone is threatening you, please tell  your healthcare provider.  Living Will/ Health care power of attorney- Speak with your healthcare provider and family. Iron-Rich Diet An iron-rich diet contains foods that are good sources of iron. Iron is an important mineral that helps your body produce hemoglobin. Hemoglobin is a protein in red blood cells that carries oxygen to the body's tissues. Sometimes, the iron level in your blood can be low. This  may be caused by:  A lack of iron in your diet.  Blood loss.  Times of growth, such as during pregnancy or during a child's growth and development. Low levels of iron can cause a decrease in the number of red blood cells. This can result in iron deficiency anemia. Iron deficiency anemia symptoms include:  Tiredness.  Weakness.  Irritability.  Increased chance of infection. Here are some recommendations for daily iron intake:  Males older than 65 years of age need 8 mg of iron per day.  Women ages 15 to 69 need 18 mg of iron per day.  Pregnant women need 27 mg of iron per day, and women who are over 81 years of age and breastfeeding need 9 mg of iron per day.  Women over the age of 47 need 8 mg of iron per day. SOURCES OF IRON There are 2 types of iron that are found in food: heme iron and nonheme iron. Heme iron is absorbed by the body better than nonheme iron. Heme iron is found in meat, poultry, and fish. Nonheme iron is found in grains, beans, and vegetables. Heme Iron Sources Food / Iron (mg)  Chicken liver, 3 oz (85 g)/ 10 mg  Beef liver, 3 oz (85 g)/ 5.5 mg  Oysters, 3 oz (85 g)/ 8 mg  Beef, 3 oz (85 g)/ 2 to 3 mg  Shrimp, 3 oz (85 g)/ 2.8 mg  [place], 3 oz (85 g)/ 2 mg  Chicken, 3 oz (85 g) / 1 mg  Fish (tuna, halibut), 3 oz (85 g)/ 1 mg  Pork, 3 oz (85 g)/ 0.9 mg Nonheme Iron Sources Food / Iron (mg)  Ready-to-eat breakfast cereal, iron-fortified / 3.9 to 7 mg  Tofu,  cup / 3.4 mg  Kidney beans,  cup / 2.6 mg  Baked potato with skin / 2.7 mg  Asparagus,  cup / 2.2 mg  Avocado / 2 mg  Dried peaches,  cup / 1.6 mg  Raisins,  cup / 1.5 mg  Soy milk, 1 cup / 1.5 mg  Whole-wheat bread, 1 slice / 1.2 mg  Spinach, 1 cup / 0.8 mg  Broccoli,  cup / 0.6 mg IRON ABSORPTION Certain foods can decrease the body's absorption of iron. Try to avoid these foods and beverages while eating meals with iron-containing  foods:  Coffee.  Tea.  Fiber.  Soy. Foods containing vitamin C can help increase the amount of iron your body absorbs from iron sources, especially from nonheme sources. Eat foods with vitamin C along with iron-containing foods to increase your iron absorption. Foods that are high in vitamin C include many fruits and vegetables. Some good sources are:  Fresh orange juice.  Oranges.  Strawberries.  Mangoes.  Grapefruit.  Red bell peppers.  Green bell peppers.  Broccoli.  Potatoes with skin.  Tomato juice. Document Released: 09/01/2004 Document Revised: 04/12/2011 Document Reviewed: 07/09/2010 [facility] Patient Information 2014 [place], [place].

## 2012-12-15 NOTE — Progress Notes (Signed)
Subjective:    Patient ID: [facility] Julianne Handler, male    DOB: 1945/06/11, 67 y.o.   MRN: [id number] Chief Complaint  Patient presents with  . Annual Exam    caregiver is concerned about sores on legs and depression. [name]. not fasting  . Immunizations    needs zoster Rx   HPI Going to try to get a walk-in shower so will be on the look-out for medical record requests for this.  Cannot step over the ledge to get into the bath. Has a shower chair right now but still can't lift legs enough - can't even get up over a curb or get into a car, had to build a ramp to get into the house.  Is still feeling very depressed and [name] is concerned that his frequent crying episodes are more than normal grieving.  More leg ulcers and [name] is not sure how to care for these - has been keeping covered w/ neosporin and guaze and seem to do better than leaving them open to dry.  [name] contacted [facility] as she thinks it would be really good for pt - physically, mentally, and emotionally to be able to get out of the house and have something to do during the day. [name] is really excited about the wide range of services [facility] can provide but [name] is reluctant because he doesn't want to switch drs.   In the past few months has not been on any iron pills.  Prior was on iron supp 4x/wk for chronic anemia.  [name] did not know this so has not been giving them to him.  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 Clydie Braun - [facility] in [place] - and was referred to eye surgeon w/ home he has an appt w/ 12/22.  Dr. Ralene Cork - podiatry - sees pt regularly for diabetic foot care. Looked at ulcers but rec f/u w/ PCP for wound care and consider referral to wound center or vascular.  On daily mvi and Asa 81.  Vitamin b12 supp is 5000 mcg qd. Req a refill of proair today.  Past Medical History  Diagnosis Date  . Diabetes mellitus without complication   . Hypertension   . Cognitive deficits   . Hyperlipidemia    Current  Outpatient Prescriptions on File Prior to Visit  Medication Sig Dispense Refill  . ammonium lactate (AMLACTIN) 12 % cream Apply 1 Bottle topically daily as needed (apply to sores on legs for healing).      Marland Kitchen aspirin 81 MG chewable tablet Chew 81 mg by mouth at bedtime.      . Cyanocobalamin (B-12) 500 MCG TABS Take 500 mcg by mouth daily.       . ferrous sulfate 325 (65 FE) MG tablet Take 325 mg by mouth See admin instructions. Take 1 tablet on Sunday, Tuesday, Thursday, saturday      . loperamide (IMODIUM A-D) 2 MG tablet Take 2 mg by mouth 2 (two) times daily as needed for diarrhea or loose stools.      . Multiple Vitamin (MULTIVITAMIN) tablet Take 1 tablet by mouth daily.      Marland Kitchen OVER THE COUNTER MEDICATION Take 1 tablet by mouth at bedtime. "Leg cramps pill"       No current facility-administered medications on file prior to visit.   Allergies  Allergen Reactions  . Sulfa Antibiotics Hives   Review of Systems  Constitutional: Negative for unexpected weight change.  Eyes: Positive for visual  disturbance.  Cardiovascular: Positive for leg swelling.  Gastrointestinal: Negative for nausea, vomiting, abdominal pain, diarrhea and constipation.  Musculoskeletal: Positive for arthralgias, back pain, gait problem, joint swelling and myalgias.  Skin: Positive for color change, rash and wound.  Psychiatric/Behavioral: Positive for dysphoric mood. The patient is not nervous/anxious.       BP 119/54  Pulse 60  Temp(Src) 97.5 F (36.4 C)  Resp 16  Ht 5\' 4"  (1.626 m)  Wt 231 lb (104.781 kg)  BMI 39.63 kg/m2 Objective:   Physical Exam  Constitutional: He is oriented to person, place, and time. He appears well-developed and well-nourished. No distress.  HENT:  Head: Normocephalic and atraumatic.  Eyes: Conjunctivae are normal. Pupils are equal, round, and reactive to light. No scleral icterus.  Neck: Normal range of motion. Neck supple. No thyromegaly present.  Cardiovascular: Normal rate,  regular rhythm, normal heart sounds and intact distal pulses.   Pulmonary/Chest: Effort normal and breath sounds normal. No respiratory distress.  Musculoskeletal: He exhibits edema and tenderness.       Right hip: He exhibits decreased range of motion.       Left hip: He exhibits decreased range of motion.  Lymphadenopathy:    He has no cervical adenopathy.  Neurological: He is alert and oriented to person, place, and time.  Skin: Skin is warm and dry. Rash noted. Rash is macular. He is not diaphoretic. There is erythema.  2+ pitting lower ext edema to knees, mild-mod erythema with bilateral superficial venous ulcerations - scabbed over on left anterior lower leg but weeping small amount of serous drainage w/ small amount of white necrotic tissue in center of granulation tissue in superficial ulceration over posterior right lower leg.  Bilateral venous stasis dermatitis.   Psychiatric: He has a normal mood and affect. His behavior is normal.   Bilateral unna boots applied by myself and Marcelino Duster in office today to aide in decreasing edema to increase ulcer healing. No clinical signs of cellulitis or infection of ulcer at this time.    EKG: normal sinus bradycarida - abnml lead III and aVF with flipped t's but unchanged form 08/30/2012.  Results for orders placed in visit on 12/15/12  CBC WITH DIFFERENTIAL      Result Value Range   WBC 6.7  4.0 - 10.5 K/uL   RBC 4.11 (*) 4.22 - 5.81 MIL/uL   Hemoglobin 12.0 (*) 13.0 - 17.0 g/dL   HCT 30.8 (*) 65.7 - 84.6 %   MCV 83.2  78.0 - 100.0 fL   MCH 29.2  26.0 - 34.0 pg   MCHC 35.1  30.0 - 36.0 g/dL   RDW 96.2  95.2 - 84.1 %   Platelets 222  150 - 400 K/uL   Neutrophils Relative % 67  43 - 77 %   Neutro Abs 4.5  1.7 - 7.7 K/uL   Lymphocytes Relative 17  12 - 46 %   Lymphs Abs 1.1  0.7 - 4.0 K/uL   Monocytes Relative 12  3 - 12 %   Monocytes Absolute 0.8  0.1 - 1.0 K/uL   Eosinophils Relative 4  0 - 5 %   Eosinophils Absolute 0.3  0.0 - 0.7  K/uL   Basophils Relative 0  0 - 1 %   Basophils Absolute 0.0  0.0 - 0.1 K/uL   Smear Review Criteria for review not met    LIPID PANEL      Result Value Range   Cholesterol 112  0 -  200 mg/dL   Triglycerides 161 (*) <150 mg/dL   HDL 28 (*) >09 mg/dL   Total CHOL/HDL Ratio 4.0     VLDL 37  0 - 40 mg/dL   LDL Cholesterol 47  0 - 99 mg/dL  COMPREHENSIVE METABOLIC PANEL      Result Value Range   Sodium 140  135 - 145 mEq/L   Potassium 4.0  3.5 - 5.3 mEq/L   Chloride 101  96 - 112 mEq/L   CO2 31  19 - 32 mEq/L   Glucose, Bld 141 (*) 70 - 99 mg/dL   BUN 20  6 - 23 mg/dL   Creat 6.04  5.40 - 9.81 mg/dL   Total Bilirubin 0.6  0.3 - 1.2 mg/dL   Alkaline Phosphatase 64  39 - 117 U/L   AST 16  0 - 37 U/L   ALT 16  0 - 53 U/L   Total Protein 6.8  6.0 - 8.3 g/dL   Albumin 4.2  3.5 - 5.2 g/dL   Calcium 9.6  8.4 - 19.1 mg/dL  VITAMIN Y78      Result Value Range   Vitamin B-12 >2000 (*) 211 - 911 pg/mL  FERRITIN      Result Value Range   Ferritin 143  22 - 322 ng/mL  IRON AND TIBC      Result Value Range   Iron 59  42 - 165 ug/dL   UIBC 295  621 - 308 ug/dL   TIBC 657  846 - 962 ug/dL   %SAT 22  20 - 55 %  POCT GLYCOSYLATED HEMOGLOBIN (HGB A1C)      Result Value Range   Hemoglobin A1C 6.7     Assessment & Plan:   Vitamin B12 deficiency - Plan: CBC with Differential, Vitamin B12 - on otc oral b12 supp of 5000 mcg qd. Used to get reg b12 injections so check level.  B12 level very elevated to check mma to ensure absorbing large oral dose adequately.  Type II or unspecified type diabetes mellitus with peripheral circulatory disorders, not stated as uncontrolled(250.70) - Plan: POCT glycosylated hemoglobin (Hb A1C) - a1c improving! Doing better w/ diet and less snacking of potato chips.  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  Decrease atenolol from 50 to 25 and pt tends be mildly bradycardic and is at increased fall risk.  Encounter for long-term (current) use of other medications - Plan: Comprehensive metabolic panel, Ferritin, Iron and TIBC  Hyperlipidemia LDL goal < 100 - Plan: Lipid panel - Pt was NOT fasting today so lipid panel not drawn - pt will RTC for lab only visit on Mon 12/18/12 for lipid panel as not fasting today.  Hypertriglyceridemia  Need for prophylactic vaccination and inoculation against influenza - Plan: Flu Vaccine QUAD 36+ mos IM  Anemia - Plan: Ferritin, Iron and TIBC - was prior taking otc iron tables 4d a wk but came off about 2 mos ago - recheck to see if he needs them.  CBC stable and iron studies all low-normal. Do not restart iron supplements for now but try to increase dietary iron intake.  Need for zoster vaccination - Plan: zoster vaccine live, PF, (ZOSTAVAX) 95284 UNT/0.65ML injection, zoster vaccine live, PF, (ZOSTAVAX) 13244  UNT/0.65ML injection, CANCELED: Varicella-zoster vaccine subcutaneous  Venous stasis ulcer, bilaterally - no concern for infection today. Applied bilateral unna boots in office to reduce lower ext edema and aid in ulcer healing. Recheck in 3d, then plan to change 1-2x/wk - hopefully through home health nursing. Hopefully will be able to d/c unna boots in 3-4 wks.  Unspecified episodic mood disorder - is grieving for Ms. Kathi Der - his prior caretaker and closest family relation - has lost a lot of people over the past sev years - but Waynetta Sandy thinks he is still crying more than usual and that some depression has been going on for a while - even prior to Ms. Moore's passing. Will start pt on zoloft - has had never been on any mood meds/ssris prior.  I think that pt's getting out of the house during the day - hopefully w/ the [facility] program - will provide a much needed  distraction which is likely to help mood sxs even more.  Debility - unable to step into bath tub and bath on own so needs walk-in tub.  Cannot do stairs at all - had to build a ramp on Ms. Moore's house when he moved in. Will try to get pt HH PT.  Would really benefit from the multi-disciplinary approach at [facility] - hopefully will start in the next sev mos.  Meds ordered this encounter  Medications  . albuterol (PROVENTIL HFA;VENTOLIN HFA) 108 (90 BASE) MCG/ACT inhaler    Sig: Inhale into the lungs every 6 (six) hours as needed for wheezing or shortness of breath.  . lisinopril-hydrochlorothiazide (PRINZIDE,ZESTORETIC) 10-12.5 MG per tablet    Sig: Take 1 tablet by mouth daily.    Dispense:  90 tablet    Refill:  1  . atenolol (TENORMIN) 25 MG tablet    Sig: Take 1 tablet (25 mg total) by mouth daily.    Dispense:  90 tablet    Refill:  1  . sertraline (ZOLOFT) 25 MG tablet    Sig: Take 1 tablet (25 mg total) by mouth daily.    Dispense:  90 tablet    Refill:  1  . simvastatin (ZOCOR) 20 MG tablet    Sig: Take 1 tablet (20 mg total) by mouth at bedtime.    Dispense:  30 tablet    Refill:  5  . glimepiride (AMARYL) 4 MG tablet    Sig: Take 1 tablet (4 mg total) by mouth 2 (two) times daily.    Dispense:  60 tablet    Refill:  5  . zoster vaccine live, PF, (ZOSTAVAX) 16109 UNT/0.65ML injection    Sig: Inject 19,400 Units into the skin once.    Dispense:  1 each    Refill:  0  . zoster vaccine live, PF, (ZOSTAVAX) 60454 UNT/0.65ML injection    Sig: Inject 19,400 Units into the skin once.    Dispense:  1 each    Refill:  0   Norberto Sorenson, MD MPH

## 2012-12-15 NOTE — Progress Notes (Signed)
Subjective:    [facility]) Nicholas Hardin is a 67 y.o. male who presents for Medicare Initial preventive examination.   Preventive Screening-Counseling & Management  Tobacco History  Smoking status  . Never Smoker   Smokeless tobacco  . Never Used    Problems Prior to Visit 1. His aunt (who was about the same age as him, Nicholas Hardin, passed away several months ago. Nicholas Hardin was living w/ her and she was his main caretakers - prepared food, checked sugars, organized his meds, etc.  Current Problems (verified) Patient Active Problem List   Diagnosis Date Noted  . Type II or unspecified type diabetes mellitus with peripheral circulatory disorders, not stated as uncontrolled(250.70) 12/15/2012  . Vitamin B12 deficiency 12/15/2012  . 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 Essential hypertension, benign 12/15/2012  . Encounter for long-term (current) use of other medications 12/15/2012  . Gallstones 08/08/2012    Medications Prior to Visit Current Outpatient Prescriptions on File Prior to Visit  Medication Sig Dispense Refill  . ammonium lactate (AMLACTIN) 12 % cream Apply 1 Bottle topically daily as needed (apply to sores on legs for healing).      Marland Kitchen aspirin 81 MG chewable tablet Chew 81 mg by mouth at bedtime.      Marland Kitchen atenolol (TENORMIN) 50 MG tablet Take 50 mg by mouth daily.      . Cyanocobalamin (B-12) 500 MCG TABS Take 500 mcg by mouth daily.       . ferrous sulfate 325 (65 FE) MG tablet Take 325 mg by mouth See admin instructions. Take 1 tablet on Sunday, Tuesday, Thursday, saturday      . glimepiride (AMARYL) 4 MG tablet Take 4 mg by mouth 2 (two) times daily.      . hydrochlorothiazide (HYDRODIURIL) 25 MG tablet Take 25 mg by mouth daily.      Marland Kitchen lisinopril (PRINIVIL,ZESTRIL) 5 MG tablet Take 5 mg by mouth daily.      Marland Kitchen loperamide (IMODIUM A-D) 2 MG tablet Take 2 mg by mouth 2 (two) times daily as needed for diarrhea or loose stools.      .  Multiple Vitamin (MULTIVITAMIN) tablet Take 1 tablet by mouth daily.      Marland Kitchen OVER THE COUNTER MEDICATION Take 1 tablet by mouth at bedtime. "Leg cramps pill"      . simvastatin (ZOCOR) 20 MG tablet Take 20 mg by mouth at bedtime.       No current facility-administered medications on file prior to visit.    Current Medications (verified) Current Outpatient Prescriptions  Medication Sig Dispense Refill  . albuterol (PROVENTIL HFA;VENTOLIN HFA) 108 (90 BASE) MCG/ACT inhaler Inhale into the lungs every 6 (six) hours as needed for wheezing or shortness of breath.      Marland Kitchen ammonium lactate (AMLACTIN) 12 % cream Apply 1 Bottle topically daily as needed (apply to sores on legs for healing).      Marland Kitchen aspirin 81 MG chewable tablet Chew 81 mg by mouth at bedtime.      Marland Kitchen atenolol (TENORMIN) 50 MG tablet Take 50 mg by mouth daily.      . Cyanocobalamin (B-12) 500 MCG TABS Take 500 mcg by mouth daily.       . ferrous sulfate 325 (65 FE) MG tablet Take 325 mg by mouth See admin instructions. Take 1 tablet on Sunday, Tuesday, Thursday, saturday      . glimepiride (AMARYL) 4 MG tablet Take 4 mg by mouth  2 (two) times daily.      . hydrochlorothiazide (HYDRODIURIL) 25 MG tablet Take 25 mg by mouth daily.      Marland Kitchen lisinopril (PRINIVIL,ZESTRIL) 5 MG tablet Take 5 mg by mouth daily.      Marland Kitchen loperamide (IMODIUM A-D) 2 MG tablet Take 2 mg by mouth 2 (two) times daily as needed for diarrhea or loose stools.      . Multiple Vitamin (MULTIVITAMIN) tablet Take 1 tablet by mouth daily.      Marland Kitchen OVER THE COUNTER MEDICATION Take 1 tablet by mouth at bedtime. "Leg cramps pill"      . simvastatin (ZOCOR) 20 MG tablet Take 20 mg by mouth at bedtime.       No current facility-administered medications for this visit.     Allergies (verified) Sulfa antibiotics   PAST HISTORY  Family History Family History  Problem Relation Age of Onset  . Cancer Father   . Heart attack Paternal Grandfather   . Kidney disease Paternal Aunt      Social History History  Substance Use Topics  . Smoking status: Never Smoker   . Smokeless tobacco: Never Used  . Alcohol Use: No    Are there smokers in your home (other than you)?  Yes  Risk Factors Current exercise habits: Home exercise routine includes walking 1 1/2 hrs per day.  Dietary issues discussed: trying to do healthier diet  Cardiac risk factors: advanced age (older than 23 for men, 37 for women), diabetes mellitus, dyslipidemia, hypertension, male gender, obesity (BMI >= 30 kg/m2) and sedentary lifestyle.  Depression Screen (Note: if answer to either of the following is "Yes", a more complete depression screening is indicated)   Q1: Over the past two weeks, have you felt down, depressed or hopeless? :yes [id number]}  Q2: Over the past two weeks, have you felt little interest or pleasure in doing things? Yes  Have you lost interest or pleasure in daily life? No  Do you often feel hopeless? No  Do you cry easily over simple problems? Yes  Activities of Daily Living In your present state of health, do you have any difficulty performing the following activities?:  Driving? No Managing money?  No Feeding yourself? No Getting from bed to chair? No Climbing a flight of stairs? Yes Preparing food and eating?: No Bathing or showering? Yes Getting dressed: No Getting to the toilet? Yes Using the toilet:No Moving around from place to place: Yes In the past year have you fallen or had a near fall?:Yes   Are you sexually active?  No  Do you have more than one partner?  No  Hearing Difficulties: Yes Do you often ask people to speak up or repeat themselves? Yes Do you experience ringing or noises in your ears? Yes Do you have difficulty understanding soft or whispered voices? Yes   Do you feel that you have a problem with memory? No  Do you often misplace items? No  Do you feel safe at home?  Yes  Cognitive Testing  Alert? No  Normal Appearance?No  Oriented to  person? yes  Place? Yes   Time? Yes  Recall of three objects?  Yes  Can perform simple calculations? No  Displays appropriate judgment?No  Can read the correct time from a watch face?No   Advanced Directives have been discussed with the patient? No   List the Names of Other Physician/Practitioners you currently use: 1.    Indicate any recent Medical Services you may have  received from other than [facility] providers in the past year (date may be approximate).  Immunization History  Administered Date(s) Administered  . Pneumococcal Polysaccharide 06/16/2012    Screening Tests Health Maintenance  Topic Date Due  . Tetanus/tdap  05/31/1966  . Zostavax  05/31/2007  . Influenza Vaccine  09/01/2012  . Colonoscopy  05/02/2013  . Pneumococcal Polysaccharide Vaccine Age 84 And Over  Completed    All answers were reviewed with the patient and necessary referrals were made:  Norberto Sorenson, MD   12/15/2012   History reviewed: allergies, current medications, past family history, past medical history, past social history, past surgical history and problem list  Review of Systems Pertinent items are noted in HPI.    Objective:     Vision by Snellen chart: right WUJ:WJXBJYN declines measurement, left WGN:FAOZHYQ declines measurement just had vision exam at [facility]ometry 3 wks ago by Dr. Clydie Braun Blood pressure 119/54, pulse 60, temperature 97.5 F (36.4 C), resp. rate 16, height 5\' 4"  (1.626 m), weight 231 lb (104.781 kg). Body mass index is 39.63 kg/(m^2).  BP 119/54  Pulse 60  Temp(Src) 97.5 F (36.4 C)  Resp 16  Ht 5\' 4"  (1.626 m)  Wt 231 lb (104.781 kg)  BMI 39.63 kg/m2  General Appearance:    Alert, cooperative, no distress, appears stated age  Head:    Normocephalic, without obvious abnormality, atraumatic  Eyes:    PERRL, conjunctiva/corneas clear, EOM's intact, fundi    benign, both eyes       Ears:    Normal TM's and external ear canals, both ears  Nose:   Nares normal,  septum midline, mucosa normal, no drainage    or sinus tenderness  Throat:   Lips, mucosa, and tongue normal; teeth and gums normal  Neck:   Supple, symmetrical, trachea midline, no adenopathy;       thyroid:  No enlargement/tenderness/nodules; no carotid   bruit or JVD  Back:     Symmetric, no curvature, ROM normal, no CVA tenderness  Lungs:     Clear to auscultation bilaterally, respirations unlabored  Chest wall:    No tenderness or deformity  Heart:    Regular rate and rhythm, S1 and S2 normal, no murmur, rub   or gallop           Extremities:   Extremities normal, atraumatic, no cyanosis or edema  Pulses:  not palpable in feet  Skin:   Lower extremity venous stasis changes with erythema, hyperpigmentation, hairless shiny skin, 2+ pitting edema and superficial healing venous stasis ulcers on right posterior leg and left anterior leg.  Lymph nodes:   Cervical, supraclavicular, and axillary nodes normal  Neurologic:   CNII-XII grossly intact.        Assessment:     Initial medicare Wellness Exam      Plan:     During the course of the visit the patient was educated and counseled about appropriate screening and preventive services including:    Pneumococcal vaccine - done 06/2012 after pt turned 67 yo.  Influenza vaccine - done today  Td vaccine - not covered by medicare  Screening electrocardiogram - done today, scanned in.  Prostate cancer screening - UTD psa done in May  Colorectal cancer screening - done 05/02/2008  Diabetes screening - has known DM  Glaucoma screening - saw eye doctor at [facility] 3 wks prev - Dr. Clydie Braun  Nutrition counseling - reviewed diet goals today - less potato chips, more green veggies.  Zoster vaccine - gave rx to get at pharmacy today  Diet review for nutrition referral? Yes __X__  Not Indicated ____ but will hold off at this time as hoping to get involved in the [facility] program where some meals will be prepared for him. He does not prepare  his own meals and is unwilling to curtail his snacking (he sneaks) even when he is not supposed to.   Patient Instructions (the written plan) was given to the patient.  Medicare Attestation I have personally reviewed: The patient's medical and social history Their use of alcohol, tobacco or illicit drugs Their current medications and supplements The patient's functional ability including ADLs,fall risks, home safety risks, cognitive, and hearing and visual impairment Diet and physical activities Evidence for depression or mood disorders  The patient's weight, height, BMI, and visual acuity have been recorded in the chart.  I have made referrals, counseling, and provided education to the patient based on review of the above and I have provided the patient with a written personalized care plan for preventive services.     Norberto Sorenson, MD   12/15/2012

## 2012-12-16 LAB — LIPID PANEL
LDL Cholesterol: 47 mg/dL (ref 0–99)
Total CHOL/HDL Ratio: 4 Ratio
VLDL: 37 mg/dL (ref 0–40)

## 2012-12-16 LAB — COMPREHENSIVE METABOLIC PANEL
ALT: 16 U/L (ref 0–53)
AST: 16 U/L (ref 0–37)
Albumin: 4.2 g/dL (ref 3.5–5.2)
Alkaline Phosphatase: 64 U/L (ref 39–117)
Potassium: 4 mEq/L (ref 3.5–5.3)
Sodium: 140 mEq/L (ref 135–145)
Total Protein: 6.8 g/dL (ref 6.0–8.3)

## 2012-12-16 LAB — IRON AND TIBC
%SAT: 22 % (ref 20–55)
Iron: 59 ug/dL (ref 42–165)

## 2012-12-18 ENCOUNTER — Ambulatory Visit (INDEPENDENT_AMBULATORY_CARE_PROVIDER_SITE_OTHER): Payer: Medicare Other | Admitting: Family Medicine

## 2012-12-18 VITALS — BP 128/74 | HR 63 | Temp 98.6°F | Resp 16 | Ht 64.0 in | Wt 231.0 lb

## 2012-12-18 DIAGNOSIS — E119 Type 2 diabetes mellitus without complications: Secondary | ICD-10-CM

## 2012-12-18 DIAGNOSIS — R609 Edema, unspecified: Secondary | ICD-10-CM

## 2012-12-18 DIAGNOSIS — R600 Localized edema: Secondary | ICD-10-CM

## 2012-12-18 MED ORDER — TENDERWRAP UNNA BOOT EX MISC: 1.0000 | CUTANEOUS | Status: DC | PRN

## 2012-12-18 NOTE — Progress Notes (Signed)
[facility] [address] [place] [address] [phone]  Date:  12/18/2012   Name:  Nicholas Hardin) Nicholas Hardin   DOB:  01-30-1946   MRN:  [id number]  PCP:  Norberto Sorenson, MD    Chief Complaint: Follow-up   History of Present Illness:  Nicholas Hardin is a 67 y.o. very pleasant male patient who presents with the following:  Nicholas Hardin was here 3 days ago for a visit.  He is wearing an unnaboot on both legs.  He notes that his legs feel a lot better since they have been wrapped.  He first had his unnaboots applied on Friday. He also needs fasting labs today; however he had labs done on the 14th.  Although he was not fasting his LDL looks fine and he did have an A1c done which was at goal.  explained that we do not have to recheck labs today.   He is here with his cousin.  Sadly his aunt (and main caretaker) passed away recently.  He is now living wiht other family members who are quite engaged in his care and responsive to his needs.   His flu shot and pneumovax is UTD   Patient Active Problem List   Diagnosis Date Noted  . Type II or unspecified type diabetes mellitus with peripheral circulatory disorders, not stated as uncontrolled(250.70) 12/15/2012  . Vitamin B12 deficiency 12/15/2012  . 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 Essential hypertension, benign 12/15/2012  . Encounter for long-term (current) use of other medications 12/15/2012  . Gallstones 08/08/2012    Past Medical History  Diagnosis Date  . Diabetes mellitus without complication   . Hypertension   . Cognitive deficits   . Hyperlipidemia     Past Surgical History  Procedure Laterality Date  . Dental surgery      had all teeth removed  . Cholecystectomy N/A 09/06/2012    Procedure: LAPAROSCOPIC CHOLECYSTECTOMY WITH INTRAOPERATIVE CHOLANGIOGRAM;  Surgeon: Robyne Askew, MD;  Location: [facility] OR;  Service: General;  Laterality: N/A;  Merri Ray blader N/A    09/06/2012    History  Substance Use Topics  . Smoking status: Never Smoker   . Smokeless tobacco: Never Used  . Alcohol Use: No    Family History  Problem Relation Age of Onset  . Cancer Father   . Heart attack Paternal Grandfather   . Kidney disease Paternal Aunt     Allergies  Allergen Reactions  . Sulfa Antibiotics Hives    Medication list has been reviewed and updated.  Current Outpatient Prescriptions on File Prior to Visit  Medication Sig Dispense Refill  . albuterol (PROVENTIL HFA;VENTOLIN HFA) 108 (90 BASE) MCG/ACT inhaler Inhale into the lungs every 6 (six) hours as needed for wheezing or shortness of breath.      Marland Kitchen ammonium lactate (AMLACTIN) 12 % cream Apply 1 Bottle topically daily as needed (apply to sores on legs for healing).      Marland Kitchen aspirin 81 MG chewable tablet Chew 81 mg by mouth at bedtime.      Marland Kitchen atenolol (TENORMIN) 25 MG tablet Take 1 tablet (25 mg total) by mouth daily.  90 tablet  1  . Cyanocobalamin (B-12) 500 MCG TABS Take 500 mcg by mouth daily.       . ferrous sulfate 325 (65 FE) MG tablet Take 325 mg by mouth See admin instructions. Take 1 tablet on Sunday, Tuesday, Thursday, saturday      .  glimepiride (AMARYL) 4 MG tablet Take 1 tablet (4 mg total) by mouth 2 (two) times daily.  60 tablet  5  . lisinopril-hydrochlorothiazide (PRINZIDE,ZESTORETIC) 10-12.5 MG per tablet Take 1 tablet by mouth daily.  90 tablet  1  . loperamide (IMODIUM A-D) 2 MG tablet Take 2 mg by mouth 2 (two) times daily as needed for diarrhea or loose stools.      . Multiple Vitamin (MULTIVITAMIN) tablet Take 1 tablet by mouth daily.      Marland Kitchen OVER THE COUNTER MEDICATION Take 1 tablet by mouth at bedtime. "Leg cramps pill"      . sertraline (ZOLOFT) 25 MG tablet Take 1 tablet (25 mg total) by mouth daily.  90 tablet  1  . simvastatin (ZOCOR) 20 MG tablet Take 1 tablet (20 mg total) by mouth at bedtime.  30 tablet  5  . zoster vaccine live, PF, (ZOSTAVAX) [address] UNT/0.65ML injection  Inject 19,400 Units into the skin once.  1 each  0  . zoster vaccine live, PF, (ZOSTAVAX) 60454 UNT/0.65ML injection Inject 19,400 Units into the skin once.  1 each  0   No current facility-administered medications on file prior to visit.    Review of Systems:  As per HPI- otherwise negative.   Physical Examination: Filed Vitals:   12/18/12 0747  BP: 128/74  Pulse: 63  Temp: 98.6 F (37 C)  Resp: 16   Filed Vitals:   12/18/12 0747  Height: 5\' 4"  (1.626 m)  Weight: 231 lb (104.781 kg)   Body mass index is 39.63 kg/(m^2). Ideal Body Weight: Weight in (lb) to have BMI = 25: 145.3  GEN: WDWN, NAD, Non-toxic, A & O x 3, mental handicap.  Obese, pleasant HEENT: Atraumatic, Normocephalic. Neck supple. No masses, No LAD. Ears and Nose: No external deformity. CV: RRR, No M/G/R. No JVD. No thrill. No extra heart sounds. PULM: CTA B, no wheezes, crackles, rhonchi. No retractions. No resp. distress. No accessory muscle use. NEURO Normal gait.  PSYCH: Normally interactive. Conversant. Not depressed or anxious appearing.  Calm demeanor.  Lower legs: removed unna boots.  Legs look good.  No active wounds noted. Trace edema bilaterally  Redid unna- boot on both legs.    Discussed on the phone with Nicholas Hardin, his current caretaker.  She would be interested in home health to work on his unna boots and also help with bathing.  Nicholas Hardin is able to bathe himself and dress, but he does not always do an adequate job with hygiene.    Foot exam performed today; normal filament sensation,  Foot NV intact Assessment and Plan: Type II or unspecified type diabetes mellitus without mention of complication, not stated as uncontrolled - Plan: [facility] DIABETES FOOT EXAM  Leg edema - Plan: Wound Dressings (TENDERWRAP UNNA BOOT) MISC  Foot exam today.  Legs rewrapped with unnaboot.  Edema and discomfort are improved.  Home health referral per his PCP Dr. Clelia Croft; will ask them for help with unnaboot over the next couple  of weeks, and also help with bathing and medications.   Plan for recheck legs either with [place] or home health in about 4 days.   rx for unnaboot sent to local pharmacy as we do not always have these in stock and I am not sure if [facility] will provide these  Signed Abbe Amsterdam, MD

## 2012-12-19 ENCOUNTER — Telehealth: Payer: Self-pay

## 2012-12-19 ENCOUNTER — Encounter: Payer: Self-pay | Admitting: Family Medicine

## 2012-12-19 NOTE — Telephone Encounter (Signed)
Pharm sent notice that they are not able to get UNNA boots for pt. I called [facility] also to check availability and was advised they can not order them either. Called [facility] supply and they do carry them, but do not bill ins. They cost $10.95 out of pocket. 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 Do you want me to call pt and advise him to get them at [facility], or wait for [facility] to see if they can supply?

## 2012-12-20 LAB — METHYLMALONIC ACID, SERUM: Methylmalonic Acid, Quant: 0.1 umol/L (ref <0.40)

## 2012-12-21 ENCOUNTER — Ambulatory Visit (INDEPENDENT_AMBULATORY_CARE_PROVIDER_SITE_OTHER): Payer: Medicare Other | Admitting: Physician Assistant

## 2012-12-21 VITALS — BP 108/60 | HR 60 | Temp 98.2°F | Resp 18 | Ht 63.75 in | Wt 231.6 lb

## 2012-12-21 DIAGNOSIS — R600 Localized edema: Secondary | ICD-10-CM

## 2012-12-21 DIAGNOSIS — IMO0001 Reserved for inherently not codable concepts without codable children: Secondary | ICD-10-CM

## 2012-12-21 DIAGNOSIS — I83009 Varicose veins of unspecified lower extremity with ulcer of unspecified site: Secondary | ICD-10-CM

## 2012-12-21 DIAGNOSIS — R609 Edema, unspecified: Secondary | ICD-10-CM

## 2012-12-21 MED ORDER — TENDERWRAP UNNA BOOT EX MISC: 1.0000 | CUTANEOUS | Status: DC | PRN

## 2012-12-21 NOTE — Progress Notes (Signed)
  [address]  [place], [place] [address]  [phone]  [email or web address]  Subjective:    Patient ID: Nicholas Hardin, male    DOB: Nov 07, 1945, 67 y.o.   MRN: [id number]  HPI  This 67 y.o. male presents for evaluation of lower extremity edema and bilateral stasis ulcers.  He needs the [employer] changed.  Since his mother's death, he lives with his cousin who accompanies him today.  She reports that the prescription for the [facility] materials sent last week was not received by the pharmacy. She also reports that the swelling in his legs is dramatically improved from his last visit, the notes of which are reviewed today by me.  Review of Systems No chest pain, SOB, HA, dizziness.  [name] that he feels well today and doesn't have any concerns.    Objective:   Physical Exam  BP 108/60  Pulse 60  Temp(Src) 98.2 F (36.8 C) (Oral)  Resp 18  Ht 5' 3.75" (1.619 m)  Wt 231 lb 9.6 oz (105.053 kg)  BMI 40.08 kg/m2  SpO2 93% WDWNWM, Alert, oriented and very cooperative. Mood is cheerful, appropriate affect. Behavior is appropriate. Dressings removed from both lower legs.  Trace edema noted, mild erythema.  The feet, NOT covered by the dressings have NO edema.  Xeroform covering stasis ulcers removed.  The lesion on the back of the RIGHT leg is resolved completely.  The lesion on the LEFT anterior tibia is nearly resolved.  Our supply of UNNA Boot dressings is currently depleted, so cast padding and ACE wraps were applied for compression temporarily. Xeroform was placed over the remaining lesion on the LEFT anterior tibia.     Assessment & Plan:  Leg edema - Plan: Wound Dressings (TENDERWRAP UNNA BOOT) MISC  Venous stasis ulcer, left  Venous stasis ulcers, right   New prescription for the dressing materials provided.  Continue with these dressings and return for re-evaluation per Dr. Alver Fisher orders.  Call or RTC in the meantime if needed.  Fernande Bras,  PA-C Physician Assistant-Certified [facility]

## 2013-01-04 ENCOUNTER — Telehealth: Payer: Self-pay

## 2013-01-04 NOTE — Telephone Encounter (Signed)
Please advise, I can call Byrd Hesselbach

## 2013-01-04 NOTE — Telephone Encounter (Signed)
Byrd Hesselbach from [place] calling to speak with dr Clelia Croft regarding getting an order for this patient to have 7 weeks of treatment and strenthing would like a call back to [phone]

## 2013-01-08 NOTE — Telephone Encounter (Signed)
Marcelino Freestone with the verbal order. Left detailed message.

## 2013-01-08 NOTE — Telephone Encounter (Signed)
Yes, that would be great. Whatever she thinks the pt needs. . . .

## 2013-01-17 ENCOUNTER — Telehealth: Payer: Self-pay | Admitting: Radiology

## 2013-01-17 ENCOUNTER — Telehealth: Payer: Self-pay

## 2013-01-17 DIAGNOSIS — R29898 Other symptoms and signs involving the musculoskeletal system: Secondary | ICD-10-CM

## 2013-01-17 DIAGNOSIS — R600 Localized edema: Secondary | ICD-10-CM

## 2013-01-17 NOTE — Telephone Encounter (Signed)
Error

## 2013-01-17 NOTE — Telephone Encounter (Signed)
We have ordered a lift chair for patient. Will you please make sure this is taken care of for him. Please get me the medical necessity papers and I will help Dr copland fill these out.

## 2013-01-17 NOTE — Telephone Encounter (Signed)
PTS CARETAKER [name] IS REQUESTING  RX WRITTEN FOR A LIFT CHAIR    BEST PHONE [phone]

## 2013-01-17 NOTE — Telephone Encounter (Signed)
Called and LMOM.  I signed order in Epic and also will mail a paper rx to their home

## 2013-01-17 NOTE — Telephone Encounter (Signed)
duplicate

## 2013-01-17 NOTE — Telephone Encounter (Signed)
Pended, please advise

## 2013-01-23 ENCOUNTER — Telehealth: Payer: Self-pay | Admitting: Radiology

## 2013-01-23 NOTE — Telephone Encounter (Signed)
Have completed form for lift chair/ medical necessity faxed to [facility]

## 2013-01-31 ENCOUNTER — Ambulatory Visit: Payer: Medicare Other

## 2013-01-31 ENCOUNTER — Ambulatory Visit (INDEPENDENT_AMBULATORY_CARE_PROVIDER_SITE_OTHER): Payer: Medicare Other | Admitting: Emergency Medicine

## 2013-01-31 VITALS — BP 122/78 | HR 68 | Temp 98.0°F | Resp 17 | Ht 64.0 in | Wt 229.0 lb

## 2013-01-31 DIAGNOSIS — S0003XA Contusion of scalp, initial encounter: Secondary | ICD-10-CM

## 2013-01-31 DIAGNOSIS — S0083XA Contusion of other part of head, initial encounter: Secondary | ICD-10-CM

## 2013-01-31 DIAGNOSIS — G501 Atypical facial pain: Secondary | ICD-10-CM

## 2013-01-31 DIAGNOSIS — Z23 Encounter for immunization: Secondary | ICD-10-CM

## 2013-01-31 MED ORDER — CEPHALEXIN 500 MG PO CAPS: 500.0000 mg | ORAL_CAPSULE | Freq: Three times a day (TID) | ORAL | Status: DC

## 2013-01-31 MED ORDER — MUPIROCIN 2 % EX OINT: 1.0000 "application " | TOPICAL_OINTMENT | Freq: Two times a day (BID) | CUTANEOUS | Status: DC

## 2013-01-31 NOTE — Patient Instructions (Signed)
On Friday Morning go to [facility] on [address] for a [employer]. May go early. They open at 8

## 2013-01-31 NOTE — Addendum Note (Signed)
Addended by: Lesle Chris A on: 01/31/2013 08:52 PM   Modules accepted: Orders

## 2013-01-31 NOTE — Progress Notes (Addendum)
Subjective:    Patient ID: Nicholas Hardin [facility]) Nicholas Hardin, male    DOB: September 12, 1945, 67 y.o.   MRN: [id number]  HPI Scribed for Lesle Chris MD, the patient was seen in room 4. This chart was scribed by Lewanda Rife, ED scribe. Patient's care was started at 1:12 PM Hx provided by pt and caregiver.  HPI Comments: Nicholas Hardin is a 67 y.o. male who presents to the [facility] complaining of facial injury onset last night during mechanical fall when walking to bedroom tripped hit left face on a corner of a night stand. Reports associated constant moderate pain to bridge of nose, superficial laceration on left nose, Reports taking ibuprofen, neosporin, steri-strip dressing applied, and hydrogen peroxide with mild relief of symptoms. Denies associated LOC, syncopal episode, neck pain, back pain, headaches.  Reports unknown tetanus status.   Past Medical History  Diagnosis Date  . Diabetes mellitus without complication   . Hypertension   . Cognitive deficits   . Hyperlipidemia     Past Surgical History  Procedure Laterality Date  . Dental surgery      had all teeth removed  . Cholecystectomy N/A 09/06/2012    Procedure: LAPAROSCOPIC CHOLECYSTECTOMY WITH INTRAOPERATIVE CHOLANGIOGRAM;  Surgeon: Robyne Askew, MD;  Location: [facility] OR;  Service: General;  Laterality: N/A;  Merri Ray blader N/A     09/06/2012    Family History  Problem Relation Age of Onset  . Cancer Father   . Heart attack Paternal Grandfather   . Kidney disease Paternal Aunt     History   Social History  . Marital Status: Single    Spouse Name: N/A    Number of Children: N/A  . Years of Education: N/A   Occupational History  . Not on file.   Social History Main Topics  . Smoking status: Never Smoker   . Smokeless tobacco: Never Used  . Alcohol Use: No  . Drug Use: No  . Sexual Activity: Not on file   Other Topics Concern  . Not on file   Social History Narrative  . No narrative on  file    Allergies  Allergen Reactions  . Sulfa Antibiotics Hives    Patient Active Problem List   Diagnosis Date Noted  . Type II or unspecified type diabetes mellitus with peripheral circulatory disorders, not stated as uncontrolled(250.70) 12/15/2012  . Vitamin B12 deficiency 12/15/2012  . 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 Essential hypertension, benign 12/15/2012  . Encounter for long-term (current) use of other medications 12/15/2012  . Gallstones 08/08/2012       Review of Systems     Objective:   Physical Exam Physical Exam  Nursing note and vitals reviewed. Constitutional: He is oriented to person, place, and time. He appears well-developed and well-nourished. No distress.  HENT: [place] eye on left side. No battle signs. Edentulous. No blood noted in nose. No hemotympanum. Baseline alternating exotropia. No step-offs or deformities noted. There is puffiness over the left zygoma. There is some bruising in this area. There is a tiny abrasion in the left frontal area.   Head: Normocephalic.  Eyes: EOM are normal. PERRL. Conjunctivae are normal.    Neck: Neck supple. No tracheal deviation present.  Cardiovascular: Normal rate.   Pulmonary/Chest: Effort normal. No respiratory distress.  Musculoskeletal: Normal range of motion.  Neurological: He is alert and oriented to person, place, and time. Patient speaks in full sentences.  Skin: Skin is warm and dry. Bleeding well-controlled and dressed with steri-strips.  Psychiatric: He has a normal mood and affect. His behavior is normal.  [facility] reading (PRIMARY) by  Dr. Cleta Alberts there is a nasal fracture noted. There is also mild irregularity of the infraorbital rim. There does not appear to be fluid in the left maxillary sinus.          Assessment & Plan:  Patient with a laceration over his nose and ecchymosis around the left. He has a nasal fracture by x-ray. We'll treat the wound with  Bactroban. CT of the left zygoma ordered. We'll also cover with cephalexin since. The nasal fracture is open. On the x-ray reading of the facial films there is a possibility of fluid in the mid left mastoid. Because of this we'll go ahead and schedule a CT of the head and face tomorrow. I called and talked with his cousin who said he has been acting perfectly normal tonight he said no vomiting and a good supper and had no complaints tonight. I personally performed the services described in this documentation, which was scribed in my presence. The recorded information has been reviewed and is accurate.

## 2013-02-01 ENCOUNTER — Telehealth: Payer: Self-pay | Admitting: Radiology

## 2013-02-01 ENCOUNTER — Other Ambulatory Visit (HOSPITAL_COMMUNITY): Payer: Medicare Other

## 2013-02-01 NOTE — Telephone Encounter (Signed)
IMPRESSION:  1. Known nondisplaced nasal bone fracture. No other facial fractures  identified. If clinically warranted, CT does offer significantly  increased sensitivity for facial fractures.  2. Asymmetric increased density in the left mastoid region -I cannot  exclude a mastoid effusion.  Patient needs CT head and CT Facial, the facial needs additional clinical when I tried to put in the CT head, I was told similar study exists. I am sending him for the scan because he needs it urgently. The insurance company is closed today for holiday when I called them to get the auth. [facility]. Called his cousin left message to advise, have left message for her to call me back.

## 2013-02-02 ENCOUNTER — Ambulatory Visit (HOSPITAL_COMMUNITY)
Admission: RE | Admit: 2013-02-02 | Discharge: 2013-02-02 | Disposition: A | Discharge status: Home / Self Care | Payer: Medicare Other | Source: Ambulatory Visit | Attending: Emergency Medicine | Admitting: Emergency Medicine

## 2013-02-02 DIAGNOSIS — S02400A Malar fracture unspecified, initial encounter for closed fracture: Secondary | ICD-10-CM | POA: Insufficient documentation

## 2013-02-02 DIAGNOSIS — W19XXXA Unspecified fall, initial encounter: Secondary | ICD-10-CM | POA: Insufficient documentation

## 2013-02-02 DIAGNOSIS — S0083XA Contusion of other part of head, initial encounter: Secondary | ICD-10-CM

## 2013-02-02 DIAGNOSIS — S02401A Maxillary fracture, unspecified, initial encounter for closed fracture: Secondary | ICD-10-CM | POA: Insufficient documentation

## 2013-02-02 DIAGNOSIS — S022XXA Fracture of nasal bones, initial encounter for closed fracture: Secondary | ICD-10-CM | POA: Insufficient documentation

## 2013-02-02 DIAGNOSIS — G319 Degenerative disease of nervous system, unspecified: Secondary | ICD-10-CM | POA: Insufficient documentation

## 2013-02-02 DIAGNOSIS — S0280XA Fracture of other specified skull and facial bones, unspecified side, initial encounter for closed fracture: Secondary | ICD-10-CM | POA: Insufficient documentation

## 2013-02-02 NOTE — Telephone Encounter (Signed)
Patients cousin did not get my message yesterday, she is taking him today for the scan both facial and head CT are now authorized.

## 2013-02-16 ENCOUNTER — Ambulatory Visit (INDEPENDENT_AMBULATORY_CARE_PROVIDER_SITE_OTHER): Payer: Medicare Other

## 2013-02-16 VITALS — BP 119/63 | HR 63 | Resp 18 | Ht 65.0 in | Wt 227.0 lb

## 2013-02-16 DIAGNOSIS — E1140 Type 2 diabetes mellitus with diabetic neuropathy, unspecified: Secondary | ICD-10-CM

## 2013-02-16 DIAGNOSIS — M79609 Pain in unspecified limb: Secondary | ICD-10-CM

## 2013-02-16 DIAGNOSIS — E1149 Type 2 diabetes mellitus with other diabetic neurological complication: Secondary | ICD-10-CM

## 2013-02-16 DIAGNOSIS — E1142 Type 2 diabetes mellitus with diabetic polyneuropathy: Secondary | ICD-10-CM

## 2013-02-16 DIAGNOSIS — B351 Tinea unguium: Secondary | ICD-10-CM

## 2013-02-16 NOTE — Progress Notes (Signed)
   Subjective:    Patient ID: Nicholas Hardin, male    DOB: 1945-05-05, 68 y.o.   MRN: [id number] Pt presents for Debridement of B/L 1-5 toenails, pt's legs are being taken care of by the vascular surgeon and the left shin is covered in Telfa. HPI    Review of Systems no new changes     Objective:   Physical Exam Vascular status is intact with pedal with thready DP and PT pulses DP plus one over 4 PT nonpalpable bilateral there is +1 edema bilateral patient does have venous stasis both lower extremity has a dressing for the stasis blister ulcer that informed of his left leg being managed by vascular. Has home nursing doing dressing changes on his left leg. Dressings intact and dry at this time. Nails thick yellow brittle discolored and friable 1 through 5 bilateral. They're no open wounds or ulcerations of the foot or ankle area except for the left shin. Neurologically epicritic and proprioceptive sensations diminished on Semmes Weinstein testing absent sensation to the digits and plantar forefoot and arch area.       Assessment & Plan:  Assessment diabetes with complication and peripheral neuropathy and likely angiopathy. There is also venous stasis complications which are being managed by vascular. At this time debridement of nails 1 through 5 bilateral return for follow palliative nail care in 3 months for an as-needed basis. Maintain appropriate coming shoes as instructed  Alvan Dameichard Nochum Fenter DPM

## 2013-02-16 NOTE — Patient Instructions (Signed)
Diabetes and Foot Care Diabetes may cause you to have problems because of poor blood supply (circulation) to your feet and legs. This may cause the skin on your feet to become thinner, break easier, and heal more slowly. Your skin may become dry, and the skin may peel and crack. You may also have nerve damage in your legs and feet causing decreased feeling in them. You may not notice minor injuries to your feet that could lead to infections or more serious problems. Taking care of your feet is one of the most important things you can do for yourself.  HOME CARE INSTRUCTIONS  Wear shoes at all times, even in the house. Do not go barefoot. Bare feet are easily injured.  Check your feet daily for blisters, cuts, and redness. If you cannot see the bottom of your feet, use a mirror or ask someone for help.  Wash your feet with warm water (do not use hot water) and mild soap. Then pat your feet and the areas between your toes until they are completely dry. Do not soak your feet as this can dry your skin.  Apply a moisturizing lotion or petroleum jelly (that does not contain alcohol and is unscented) to the skin on your feet and to dry, brittle toenails. Do not apply lotion between your toes.  Trim your toenails straight across. Do not dig under them or around the cuticle. File the edges of your nails with an emery board or nail file.  Do not cut corns or calluses or try to remove them with medicine.  Wear clean socks or stockings every day. Make sure they are not too tight. Do not wear knee-high stockings since they may decrease blood flow to your legs.  Wear shoes that fit properly and have enough cushioning. To break in new shoes, wear them for just a few hours a day. This prevents you from injuring your feet. Always look in your shoes before you put them on to be sure there are no objects inside.  Do not cross your legs. This may decrease the blood flow to your feet.  If you find a minor scrape,  cut, or break in the skin on your feet, keep it and the skin around it clean and dry. These areas may be cleansed with mild soap and water. Do not cleanse the area with peroxide, alcohol, or iodine.  When you remove an adhesive bandage, be sure not to damage the skin around it.  If you have a wound, look at it several times a day to make sure it is healing.  Do not use heating pads or hot water bottles. They may burn your skin. If you have lost feeling in your feet or legs, you may not know it is happening until it is too late.  Make sure your health care provider performs a complete foot exam at least annually or more often if you have foot problems. Report any cuts, sores, or bruises to your health care provider immediately. SEEK MEDICAL CARE IF:   You have an injury that is not healing.  You have cuts or breaks in the skin.  You have an ingrown nail.  You notice redness on your legs or feet.  You feel burning or tingling in your legs or feet.  You have pain or cramps in your legs and feet.  Your legs or feet are numb.  Your feet always feel cold. SEEK IMMEDIATE MEDICAL CARE IF:   There is increasing redness,   swelling, or pain in or around a wound.  There is a red line that goes up your leg.  Pus is coming from a wound.  You develop a fever or as directed by your health care provider.  You notice a bad smell coming from an ulcer or wound. Document Released: 01/16/2000 Document Revised: 09/20/2012 Document Reviewed: 06/27/2012 Exit[employer] Patient Information 2014 [employer].  

## 2013-03-14 ENCOUNTER — Telehealth: Payer: Self-pay

## 2013-03-14 NOTE — Telephone Encounter (Signed)
Fara OldenYvonne Gambrill with [facility] is discharging the patient from her care.  His wounds have healed, he is wearing compression hose.  Her number is [phone]

## 2013-03-16 NOTE — Telephone Encounter (Signed)
Noted, to Dr Barbaraann Fasteraub FYI

## 2013-03-16 NOTE — Telephone Encounter (Signed)
FYI

## 2013-03-16 NOTE — Telephone Encounter (Signed)
Please forward a copy of this to Rohm and HaasChelle

## 2013-03-21 ENCOUNTER — Other Ambulatory Visit: Payer: Self-pay

## 2013-03-21 NOTE — Telephone Encounter (Signed)
Pts cousin Wess BottsBeth Moore is calling to have pts inhaler refilled (she does not know the name of the medication). Best# [phone]

## 2013-03-22 MED ORDER — ALBUTEROL SULFATE HFA 108 (90 BASE) MCG/ACT IN AERS: 2.0000 | INHALATION_SPRAY | Freq: Four times a day (QID) | RESPIRATORY_TRACT | Status: DC | PRN

## 2013-03-22 NOTE — Telephone Encounter (Signed)
Dr Clelia CroftShaw, pt had his CPE w/you in Nov, but I don't see asthma discussed. Do you want to RF albuterol? Pended.

## 2013-04-09 ENCOUNTER — Telehealth: Payer: Self-pay

## 2013-04-09 NOTE — Telephone Encounter (Signed)
Asencion IslamMarva guy is calling regarding the order for the walkin shower please call [phone]

## 2013-04-10 NOTE — Telephone Encounter (Signed)
LM for rtn call- No order that in system for "walk in shower".

## 2013-04-11 NOTE — Telephone Encounter (Signed)
Spoke to Yakimamarva, faxed information needed for patient, medical recorns also stated the information has been mailed.

## 2013-04-11 NOTE — Telephone Encounter (Signed)
Left message for Nicholas CreekMarva Guy to call back.

## 2013-05-11 ENCOUNTER — Telehealth: Payer: Self-pay

## 2013-05-11 NOTE — Telephone Encounter (Signed)
Wess BottsBeth Moore wants to know if paper work can be completed w/o patient being seen in order to have him placed in a facility.   [phone]

## 2013-05-14 NOTE — Telephone Encounter (Signed)
Spoke to Nicholas Hardin, she was told earlier today to bring the paperwork in, it would be reviewed and then determined if we can complete it without an OV. She is fine with this, and understands we will CB after the paperwork has been reviewed.

## 2013-05-15 ENCOUNTER — Other Ambulatory Visit: Payer: Self-pay | Admitting: Family Medicine

## 2013-05-17 NOTE — Telephone Encounter (Signed)
I have not seen this patient since December. He would need to come in and be seen to be sure we fill out the paperwork correctly.

## 2013-05-17 NOTE — Telephone Encounter (Signed)
[name] notified.

## 2013-05-17 NOTE — Telephone Encounter (Signed)
PPW dropped off and put in Dr Ellis Parentsaub's box to determine whether he can complete it w/out seeing pt first.

## 2013-05-22 ENCOUNTER — Ambulatory Visit (INDEPENDENT_AMBULATORY_CARE_PROVIDER_SITE_OTHER): Payer: Medicare Other

## 2013-05-22 VITALS — BP 134/70 | HR 65 | Resp 16

## 2013-05-22 DIAGNOSIS — M79609 Pain in unspecified limb: Secondary | ICD-10-CM

## 2013-05-22 DIAGNOSIS — B351 Tinea unguium: Secondary | ICD-10-CM

## 2013-05-22 DIAGNOSIS — E1149 Type 2 diabetes mellitus with other diabetic neurological complication: Secondary | ICD-10-CM

## 2013-05-22 DIAGNOSIS — E1140 Type 2 diabetes mellitus with diabetic neuropathy, unspecified: Secondary | ICD-10-CM

## 2013-05-22 NOTE — Progress Notes (Signed)
   Subjective:    Patient ID: Nicholas Hardin, male    DOB: 1945/05/12, 68 y.o.   MRN: [id number]  HPI Comments: "Cut my toenails"     Review of Systems no systemic changes or new findings noted     Objective:   Physical Exam Lower extremity objective findings as follows vascular status is intact pedal pulses DP and PT plus one over 4 bilateral epicritic and proprioceptive sense of sensations are intact and diminished on Semmes Weinstein testing to lower feet patient no open wounds ulcerations no secondary infections nails thick brittle crumbly friable dystrophic 1 through 5 bilateral no other new changes or findings noted       Assessment & Plan:  Assessment diabetes with complications and peripheral neuropathy there is some diminished venous stasis applications and mild edema noted both lower extremity nails thick brittle friable criptotic discolored and tender debrided 1 through 5 bilateral at this time return for future palliative diabetic foot and mycotic nail care as needed  Alvan Dameichard Geronimo Diliberto DPM

## 2013-05-22 NOTE — Patient Instructions (Signed)
Diabetes and Foot Care Diabetes may cause you to have problems because of poor blood supply (circulation) to your feet and legs. This may cause the skin on your feet to become thinner, break easier, and heal more slowly. Your skin may become dry, and the skin may peel and crack. You may also have nerve damage in your legs and feet causing decreased feeling in them. You may not notice minor injuries to your feet that could lead to infections or more serious problems. Taking care of your feet is one of the most important things you can do for yourself.  HOME CARE INSTRUCTIONS  Wear shoes at all times, even in the house. Do not go barefoot. Bare feet are easily injured.  Check your feet daily for blisters, cuts, and redness. If you cannot see the bottom of your feet, use a mirror or ask someone for help.  Wash your feet with warm water (do not use hot water) and mild soap. Then pat your feet and the areas between your toes until they are completely dry. Do not soak your feet as this can dry your skin.  Apply a moisturizing lotion or petroleum jelly (that does not contain alcohol and is unscented) to the skin on your feet and to dry, brittle toenails. Do not apply lotion between your toes.  Trim your toenails straight across. Do not dig under them or around the cuticle. File the edges of your nails with an emery board or nail file.  Do not cut corns or calluses or try to remove them with medicine.  Wear clean socks or stockings every day. Make sure they are not too tight. Do not wear knee-high stockings since they may decrease blood flow to your legs.  Wear shoes that fit properly and have enough cushioning. To break in new shoes, wear them for just a few hours a day. This prevents you from injuring your feet. Always look in your shoes before you put them on to be sure there are no objects inside.  Do not cross your legs. This may decrease the blood flow to your feet.  If you find a minor scrape,  cut, or break in the skin on your feet, keep it and the skin around it clean and dry. These areas may be cleansed with mild soap and water. Do not cleanse the area with peroxide, alcohol, or iodine.  When you remove an adhesive bandage, be sure not to damage the skin around it.  If you have a wound, look at it several times a day to make sure it is healing.  Do not use heating pads or hot water bottles. They may burn your skin. If you have lost feeling in your feet or legs, you may not know it is happening until it is too late.  Make sure your health care provider performs a complete foot exam at least annually or more often if you have foot problems. Report any cuts, sores, or bruises to your health care provider immediately. SEEK MEDICAL CARE IF:   You have an injury that is not healing.  You have cuts or breaks in the skin.  You have an ingrown nail.  You notice redness on your legs or feet.  You feel burning or tingling in your legs or feet.  You have pain or cramps in your legs and feet.  Your legs or feet are numb.  Your feet always feel cold. SEEK IMMEDIATE MEDICAL CARE IF:   There is increasing redness,   swelling, or pain in or around a wound.  There is a red line that goes up your leg.  Pus is coming from a wound.  You develop a fever or as directed by your health care provider.  You notice a bad smell coming from an ulcer or wound. Document Released: 01/16/2000 Document Revised: 09/20/2012 Document Reviewed: 06/27/2012 Exit[employer] Patient Information 2014 [employer].  

## 2013-06-15 ENCOUNTER — Encounter: Payer: Self-pay | Admitting: Family Medicine

## 2013-06-15 ENCOUNTER — Ambulatory Visit (INDEPENDENT_AMBULATORY_CARE_PROVIDER_SITE_OTHER): Payer: Medicare Other | Admitting: Family Medicine

## 2013-06-15 VITALS — BP 110/58 | HR 60 | Temp 98.2°F | Resp 16 | Ht 64.5 in | Wt 212.4 lb

## 2013-06-15 DIAGNOSIS — E119 Type 2 diabetes mellitus without complications: Secondary | ICD-10-CM

## 2013-06-15 DIAGNOSIS — I10 Essential (primary) hypertension: Secondary | ICD-10-CM

## 2013-06-15 DIAGNOSIS — F79 Unspecified intellectual disabilities: Secondary | ICD-10-CM

## 2013-06-15 DIAGNOSIS — D649 Anemia, unspecified: Secondary | ICD-10-CM

## 2013-06-15 DIAGNOSIS — Z79899 Other long term (current) drug therapy: Secondary | ICD-10-CM

## 2013-06-15 LAB — COMPLETE METABOLIC PANEL WITH GFR
ALT: 14 U/L (ref 0–53)
AST: 17 U/L (ref 0–37)
Albumin: 4.2 g/dL (ref 3.5–5.2)
Alkaline Phosphatase: 57 U/L (ref 39–117)
BILIRUBIN TOTAL: 0.5 mg/dL (ref 0.2–1.2)
BUN: 19 mg/dL (ref 6–23)
CO2: 28 mEq/L (ref 19–32)
CREATININE: 0.97 mg/dL (ref 0.50–1.35)
Calcium: 9.2 mg/dL (ref 8.4–10.5)
Chloride: 103 mEq/L (ref 96–112)
GFR, Est African American: >89 mL/min
GFR, Est Non African American: 81 mL/min
Glucose, Bld: 165 mg/dL — ABNORMAL HIGH (ref 70–99)
Potassium: 4.2 mEq/L (ref 3.5–5.3)
SODIUM: 141 meq/L (ref 135–145)
TOTAL PROTEIN: 6.9 g/dL (ref 6.0–8.3)

## 2013-06-15 LAB — CBC WITH DIFFERENTIAL/PLATELET
BASOS ABS: 0 10*3/uL (ref 0.0–0.1)
Basophils Relative: 0 % (ref 0–1)
EOS ABS: 0.3 10*3/uL (ref 0.0–0.7)
EOS PCT: 3 % (ref 0–5)
HCT: 37 % — ABNORMAL LOW (ref 39.0–52.0)
Hemoglobin: 12.6 g/dL — ABNORMAL LOW (ref 13.0–17.0)
Lymphocytes Relative: 17 % (ref 12–46)
Lymphs Abs: 1.4 10*3/uL (ref 0.7–4.0)
MCH: 28.4 pg (ref 26.0–34.0)
MCHC: 34.1 g/dL (ref 30.0–36.0)
MCV: 83.5 fL (ref 78.0–100.0)
Monocytes Absolute: 0.7 10*3/uL (ref 0.1–1.0)
Monocytes Relative: 8 % (ref 3–12)
NEUTROS PCT: 72 % (ref 43–77)
Neutro Abs: 6 10*3/uL (ref 1.7–7.7)
Platelets: 259 10*3/uL (ref 150–400)
RBC: 4.43 MIL/uL (ref 4.22–5.81)
RDW: 13.8 % (ref 11.5–15.5)
WBC: 8.4 10*3/uL (ref 4.0–10.5)

## 2013-06-15 LAB — POCT GLYCOSYLATED HEMOGLOBIN (HGB A1C): HEMOGLOBIN A1C: 6.3

## 2013-06-15 NOTE — Progress Notes (Signed)
  Tuberculosis Risk Questionnaire  1. No Were you born outside the [place] in one of the following parts of the world: [place], [place], [place], [place] or [place]?    2. No Have you traveled outside the [place] and lived for more than one month in one of the following parts of the world: [place], [place], [place], [place] or [place]?    3. Yes (has Diabetes) Do you have a compromised immune system such as from any of the following conditions:HIV/AIDS, organ or bone marrow transplantation, diabetes, immunosuppressive medicines (e.g. Prednisone, Remicaide), leukemia, lymphoma, cancer of the head or neck, gastrectomy or jejunal bypass, end-stage renal disease (on dialysis), or silicosis?     4. No Have you ever or do you plan on working in: a residential care center, a health care facility, a jail or prison or homeless shelter?    5. No Have you ever: injected illegal drugs, used crack cocaine, lived in a homeless shelter  or been in jail or prison?     6. No Have you ever been exposed to anyone with infectious tuberculosis?    Tuberculosis Symptom Questionnaire  Do you currently have any of the following symptoms?  1. No Unexplained cough lasting more than 3 weeks?   2. No Unexplained fever lasting more than 3 weeks.   3. No Night Sweats (sweating that leaves the bedclothes and sheets wet)     4. Yes(  has asthma in the past) Shortness of Breath   5. No Chest Pain   6. No Unintentional weight loss    7. No Unexplained fatigue (very tired for no reason)

## 2013-06-15 NOTE — Progress Notes (Signed)
Subjective:    Patient ID: Nicholas Hardin, male    DOB: 12/08/1945, 68 y.o.   MRN: [id number]  HPI This chart was scribed for Sherren MochaEva N Holman Bonsignore, MD by Charline BillsEssence Howell, ED Scribe. The patient was seen in room 21. Patient's care was started at 11:13 AM  HPI Comments: Nicholas Hardin is a 68 y.o. male who presents to the [facility] for paperwork. [name] states that she found an adult facility on [address] that is small with 6-10 people. The facility opens from 7:30-4:30. Pt will take medication before he goes and will return home before next dosage. She wants pt to attend at least 3 times a week. She reports a normal day for pt is waking up, eating breakfast, getting the newspaper, walking 3-4 times before sitting in front of the TV for most of the day. She states that he typically walks again in the afternoon.   Pt reports improvement with his legs. Pt has used neosporin and avoids wearing jeans. [name] states that he does not always report issues with his legs.  Pt also reports an improvement with mood and sleeping. Pt reports mild confusion but denies confusion with what day it is or where he is. He denies hallucinations or delusions. He also denies memory issues or alcohol use.  Pt had procedures to both eyes. L eye on Febuary 16th and R eye on March 9th R. He only wears glasses for reading.  Pt has eaten breakfast this morning. Pt occasionally checks blood sugar at home with average readings of 142 in the afternoon. Pt is taking Vitamin B12 but not the iron supplement.   Past Medical History  Diagnosis Date  . Diabetes mellitus without complication   . Hypertension   . Cognitive deficits   . Hyperlipidemia    Current Outpatient Prescriptions on File Prior to Visit  Medication Sig Dispense Refill  . albuterol (PROVENTIL HFA;VENTOLIN HFA) 108 (90 BASE) MCG/ACT inhaler Inhale 2 puffs into the lungs every 6 (six) hours as needed for wheezing or shortness of breath.  1 Inhaler  2    . ammonium lactate (AMLACTIN) 12 % cream Apply 1 Bottle topically daily as needed (apply to sores on legs for healing).      Marland Kitchen. atenolol (TENORMIN) 25 MG tablet TAKE 1 TABLET BY MOUTH EVERY DAY  30 tablet  0  . glimepiride (AMARYL) 4 MG tablet Take 1 tablet (4 mg total) by mouth 2 (two) times daily.  60 tablet  5  . lisinopril-hydrochlorothiazide (PRINZIDE,ZESTORETIC) 10-12.5 MG per tablet TAKE 1 TABLET BY MOUTH EVERY DAY  30 tablet  0  . sertraline (ZOLOFT) 25 MG tablet TAKE 1 TABLET BY MOUTH EVERY DAY  30 tablet  0  . simvastatin (ZOCOR) 20 MG tablet Take 1 tablet (20 mg total) by mouth at bedtime.  30 tablet  5  . aspirin 81 MG chewable tablet Chew 81 mg by mouth at bedtime.      . cephALEXin (KEFLEX) 500 MG capsule Take 1 capsule (500 mg total) by mouth 3 (three) times daily.  21 capsule  0  . Cyanocobalamin (B-12) 500 MCG TABS Take 500 mcg by mouth daily.       . ferrous sulfate 325 (65 FE) MG tablet Take 325 mg by mouth See admin instructions. Take 1 tablet on Sunday, Tuesday, Thursday, saturday      . loperamide (IMODIUM A-D) 2 MG tablet Take 2 mg by mouth 2 (two) times daily  as needed for diarrhea or loose stools.      . Multiple Vitamin (MULTIVITAMIN) tablet Take 1 tablet by mouth daily.      . mupirocin ointment (BACTROBAN) 2 % Place 1 application into the nose 2 (two) times daily.  22 g  0  . OVER THE COUNTER MEDICATION Take 1 tablet by mouth at bedtime. "Leg cramps pill"      . Wound Dressings (TENDERWRAP UNNA BOOT) MISC Apply 1 each topically every three (3) days as needed.  10 each  0  . zoster vaccine live, PF, (ZOSTAVAX) [id number] UNT/0.65ML injection Inject 19,400 Units into the skin once.  1 each  0   No current facility-administered medications on file prior to visit.   Allergies  Allergen Reactions  . Sulfa Antibiotics Hives    Review of Systems  Skin: Positive for color change and rash.  Psychiatric/Behavioral: Positive for confusion (mild). Negative for hallucinations,  sleep disturbance and dysphoric mood.      Objective:   Physical Exam  Nursing note and vitals reviewed. Constitutional: He appears well-developed and well-nourished. No distress.  HENT:  Head: Normocephalic and atraumatic.  Neck: Neck supple.  Pulmonary/Chest: Effort normal.  Neurological: He is alert.  Skin: Rash noted. He is not diaphoretic. There is erythema.  Venous stasis Thickened, shiny hair      Assessment & Plan:   Type II or unspecified type diabetes mellitus without mention of complication, not stated as uncontrolled - Plan: POCT glycosylated hemoglobin (Hb A1C), COMPLETE METABOLIC PANEL WITH GFR  Encounter for long-term (current) use of other medications - Plan: CBC with Differential  Anemia - Plan: Ferritin  Essential hypertension, benign  Mental disability - starting adult daycare. Pt should see if he can get disability.  Meds ordered this encounter  Medications  . sertraline (ZOLOFT) 25 MG tablet    Sig: TAKE 1 TABLET BY MOUTH EVERY DAY    Dispense:  90 tablet    Refill:  1    I personally performed the services described in this documentation, which was scribed in my presence. The recorded information has been reviewed and considered, and addended by me as needed.  Norberto SorensonEva Jamarien Rodkey, MD MPH

## 2013-06-16 LAB — FERRITIN: Ferritin: 97 ng/mL (ref 22–322)

## 2013-06-21 ENCOUNTER — Telehealth: Payer: Self-pay

## 2013-06-21 NOTE — Telephone Encounter (Signed)
Pt's caregiver called to check status of form Dr Clelia CroftShaw was going to fill out for pt re: adult daycare. Checked w/Dr Clelia CroftShaw and apologized to pt that it has not been completed, but Dr Clelia CroftShaw will complete and put in the mail to pt ASAP.

## 2013-06-22 MED ORDER — SERTRALINE HCL 25 MG PO TABS
ORAL_TABLET | ORAL | Status: DC
Start: 2013-06-22 — End: 2013-09-21

## 2013-06-24 NOTE — Telephone Encounter (Signed)
Form completed on Fri 5/22 and given to CMA at 104 to mail to pt's caregiver [name].

## 2013-07-16 ENCOUNTER — Other Ambulatory Visit: Payer: Self-pay | Admitting: Family Medicine

## 2013-08-10 ENCOUNTER — Other Ambulatory Visit: Payer: Self-pay | Admitting: Family Medicine

## 2013-08-17 ENCOUNTER — Telehealth: Payer: Self-pay | Admitting: Radiology

## 2013-08-17 NOTE — Telephone Encounter (Signed)
Noted, will put in appt sch. So we will do this on 8/21

## 2013-08-17 NOTE — Telephone Encounter (Signed)
Message copied by Caffie DammeLITTRELL, Shatoya Roets W on Fri Aug 17, 2013  2:40 PM ------      Message from: Fayne MediateMALPASS, DANA S      Created: Fri Aug 17, 2013  1:08 PM      Regarding: HQPAF       Mr. Nicholas Hardin has an upcoming OV schfor 09/21/13 with Dr. Norberto SorensonEva Shaw.  I am missing the following items to close his PAF.            EARLY DETECTION           Physical Activity           Urinary Incontinence            MANAGING CHRONIC ILLNESS            Diabetic Eye Exam            LDL-C/Lipid Panel            ONGOING AX & EVAL            Pressure Ulcer & Oxygen (last reported 2015)            Morbid Obesity            Major Depressive, Bipolar, & Paranoid d/o's            COPD & Asthma                  Thank you,      Mathews Argyleana Malpass, RN ------

## 2013-08-24 ENCOUNTER — Ambulatory Visit: Payer: Medicare Other

## 2013-09-07 ENCOUNTER — Ambulatory Visit (INDEPENDENT_AMBULATORY_CARE_PROVIDER_SITE_OTHER): Payer: Medicare Other

## 2013-09-07 VITALS — BP 108/53 | HR 55 | Resp 18

## 2013-09-07 DIAGNOSIS — M79609 Pain in unspecified limb: Secondary | ICD-10-CM

## 2013-09-07 DIAGNOSIS — E1140 Type 2 diabetes mellitus with diabetic neuropathy, unspecified: Secondary | ICD-10-CM

## 2013-09-07 DIAGNOSIS — M79606 Pain in leg, unspecified: Secondary | ICD-10-CM

## 2013-09-07 DIAGNOSIS — E1142 Type 2 diabetes mellitus with diabetic polyneuropathy: Secondary | ICD-10-CM

## 2013-09-07 DIAGNOSIS — E1149 Type 2 diabetes mellitus with other diabetic neurological complication: Secondary | ICD-10-CM

## 2013-09-07 DIAGNOSIS — B351 Tinea unguium: Secondary | ICD-10-CM

## 2013-09-07 DIAGNOSIS — I8310 Varicose veins of unspecified lower extremity with inflammation: Secondary | ICD-10-CM

## 2013-09-07 DIAGNOSIS — I872 Venous insufficiency (chronic) (peripheral): Secondary | ICD-10-CM

## 2013-09-07 NOTE — Patient Instructions (Signed)
Diabetes and Foot Care Diabetes may cause you to have problems because of poor blood supply (circulation) to your feet and legs. This may cause the skin on your feet to become thinner, break easier, and heal more slowly. Your skin may become dry, and the skin may peel and crack. You may also have nerve damage in your legs and feet causing decreased feeling in them. You may not notice minor injuries to your feet that could lead to infections or more serious problems. Taking care of your feet is one of the most important things you can do for yourself.  HOME CARE INSTRUCTIONS  Wear shoes at all times, even in the house. Do not go barefoot. Bare feet are easily injured.  Check your feet daily for blisters, cuts, and redness. If you cannot see the bottom of your feet, use a mirror or ask someone for help.  Wash your feet with warm water (do not use hot water) and mild soap. Then pat your feet and the areas between your toes until they are completely dry. Do not soak your feet as this can dry your skin.  Apply a moisturizing lotion or petroleum jelly (that does not contain alcohol and is unscented) to the skin on your feet and to dry, brittle toenails. Do not apply lotion between your toes.  Trim your toenails straight across. Do not dig under them or around the cuticle. File the edges of your nails with an emery board or nail file.  Do not cut corns or calluses or try to remove them with medicine.  Wear clean socks or stockings every day. Make sure they are not too tight. Do not wear knee-high stockings since they may decrease blood flow to your legs.  Wear shoes that fit properly and have enough cushioning. To break in new shoes, wear them for just a few hours a day. This prevents you from injuring your feet. Always look in your shoes before you put them on to be sure there are no objects inside.  Do not cross your legs. This may decrease the blood flow to your feet.  If you find a minor scrape,  cut, or break in the skin on your feet, keep it and the skin around it clean and dry. These areas may be cleansed with mild soap and water. Do not cleanse the area with peroxide, alcohol, or iodine.  When you remove an adhesive bandage, be sure not to damage the skin around it.  If you have a wound, look at it several times a day to make sure it is healing.  Do not use heating pads or hot water bottles. They may burn your skin. If you have lost feeling in your feet or legs, you may not know it is happening until it is too late.  Make sure your health care provider performs a complete foot exam at least annually or more often if you have foot problems. Report any cuts, sores, or bruises to your health care provider immediately. SEEK MEDICAL CARE IF:   You have an injury that is not healing.  You have cuts or breaks in the skin.  You have an ingrown nail.  You notice redness on your legs or feet.  You feel burning or tingling in your legs or feet.  You have pain or cramps in your legs and feet.  Your legs or feet are numb.  Your feet always feel cold. SEEK IMMEDIATE MEDICAL CARE IF:   There is increasing redness,   swelling, or pain in or around a wound.  There is a red line that goes up your leg.  Pus is coming from a wound.  You develop a fever or as directed by your health care provider.  You notice a bad smell coming from an ulcer or wound. Document Released: 01/16/2000 Document Revised: 09/20/2012 Document Reviewed: 06/27/2012 ExitCare Patient Information 2015 [employer]. This information is not intended to replace advice given to you by your health care provider. Make sure you discuss any questions you have with your health care provider.  

## 2013-09-07 NOTE — Progress Notes (Signed)
   Subjective:    Patient ID: Nicholas Hardin, male    DOB: 1945-05-20, 68 y.o.   MRN: [id number]  HPI i need a trim of my toenails    Review of Systems no new findings or systemic changes noted     Objective:   Physical Exam Lower extremity objective findings reveal vascular status diminished pedal pulses DP and PT one over 4 bilateral mild varicosities noted and some venous stasis both legs mid leg. No open wounds or ulcerations are noted at this time. Neurologically skin color pigment normal hair growth absent nails thick brittle crumbly friable criptotic 1 through 4 bilateral fifth have not shown much growth. No calluses no open wounds or ulcerations rectus foot type mild digital contractures are noted. There is decreased epicritic sensation Semmes Weinstein testing to forefoot digits and arch.       Assessment & Plan:  Assessment diabetes with history peripheral neuropathy and complications as well as diminished diminished neurovascular status. Nails thick painful friable gratified nails with discoloration tenderness debrided 1 through 4 bilateral 2 onychomycosis as well as diabetes and complications return for future palliative care is needed suggest 3 month followup  Alvan Dameichard Cordaryl Decelles DPM

## 2013-09-21 ENCOUNTER — Encounter: Payer: Self-pay | Admitting: Family Medicine

## 2013-09-21 ENCOUNTER — Ambulatory Visit (INDEPENDENT_AMBULATORY_CARE_PROVIDER_SITE_OTHER): Payer: Medicare Other | Admitting: Family Medicine

## 2013-09-21 VITALS — BP 100/52 | HR 68 | Temp 98.2°F | Resp 16 | Ht 65.0 in | Wt 203.0 lb

## 2013-09-21 DIAGNOSIS — F79 Unspecified intellectual disabilities: Secondary | ICD-10-CM

## 2013-09-21 DIAGNOSIS — G909 Disorder of the autonomic nervous system, unspecified: Secondary | ICD-10-CM

## 2013-09-21 DIAGNOSIS — Z79899 Other long term (current) drug therapy: Secondary | ICD-10-CM

## 2013-09-21 DIAGNOSIS — Z23 Encounter for immunization: Secondary | ICD-10-CM

## 2013-09-21 DIAGNOSIS — E119 Type 2 diabetes mellitus without complications: Secondary | ICD-10-CM

## 2013-09-21 DIAGNOSIS — E1143 Type 2 diabetes mellitus with diabetic autonomic (poly)neuropathy: Secondary | ICD-10-CM

## 2013-09-21 DIAGNOSIS — E669 Obesity, unspecified: Secondary | ICD-10-CM

## 2013-09-21 DIAGNOSIS — R609 Edema, unspecified: Secondary | ICD-10-CM

## 2013-09-21 DIAGNOSIS — I10 Essential (primary) hypertension: Secondary | ICD-10-CM

## 2013-09-21 DIAGNOSIS — R600 Localized edema: Secondary | ICD-10-CM

## 2013-09-21 DIAGNOSIS — E1159 Type 2 diabetes mellitus with other circulatory complications: Secondary | ICD-10-CM

## 2013-09-21 DIAGNOSIS — E785 Hyperlipidemia, unspecified: Secondary | ICD-10-CM | POA: Insufficient documentation

## 2013-09-21 DIAGNOSIS — E1149 Type 2 diabetes mellitus with other diabetic neurological complication: Secondary | ICD-10-CM

## 2013-09-21 LAB — CBC WITH DIFFERENTIAL/PLATELET
Basophils Absolute: 0 10*3/uL (ref 0.0–0.1)
Basophils Relative: 0 % (ref 0–1)
EOS ABS: 0.3 10*3/uL (ref 0.0–0.7)
Eosinophils Relative: 3 % (ref 0–5)
HEMATOCRIT: 40.4 % (ref 39.0–52.0)
HEMOGLOBIN: 14.1 g/dL (ref 13.0–17.0)
LYMPHS ABS: 2.1 10*3/uL (ref 0.7–4.0)
Lymphocytes Relative: 22 % (ref 12–46)
MCH: 29.3 pg (ref 26.0–34.0)
MCHC: 34.9 g/dL (ref 30.0–36.0)
MCV: 83.8 fL (ref 78.0–100.0)
MONOS PCT: 7 % (ref 3–12)
Monocytes Absolute: 0.7 10*3/uL (ref 0.1–1.0)
Neutro Abs: 6.5 10*3/uL (ref 1.7–7.7)
Neutrophils Relative %: 68 % (ref 43–77)
Platelets: 272 10*3/uL (ref 150–400)
RBC: 4.82 MIL/uL (ref 4.22–5.81)
RDW: 13.1 % (ref 11.5–15.5)
WBC: 9.6 10*3/uL (ref 4.0–10.5)

## 2013-09-21 LAB — COMPLETE METABOLIC PANEL WITH GFR
ALBUMIN: 4.6 g/dL (ref 3.5–5.2)
ALT: 10 U/L (ref 0–53)
AST: 16 U/L (ref 0–37)
Alkaline Phosphatase: 51 U/L (ref 39–117)
BILIRUBIN TOTAL: 0.7 mg/dL (ref 0.2–1.2)
BUN: 32 mg/dL — ABNORMAL HIGH (ref 6–23)
CO2: 26 meq/L (ref 19–32)
Calcium: 9.3 mg/dL (ref 8.4–10.5)
Chloride: 105 mEq/L (ref 96–112)
Creat: 1.17 mg/dL (ref 0.50–1.35)
GFR, EST AFRICAN AMERICAN: 75 mL/min
GFR, EST NON AFRICAN AMERICAN: 65 mL/min
GLUCOSE: 146 mg/dL — AB (ref 70–99)
Potassium: 4.8 mEq/L (ref 3.5–5.3)
Sodium: 142 mEq/L (ref 135–145)
Total Protein: 7.2 g/dL (ref 6.0–8.3)

## 2013-09-21 LAB — LIPID PANEL
Cholesterol: 145 mg/dL (ref 0–200)
HDL: 33 mg/dL — AB (ref >39)
LDL Cholesterol: 66 mg/dL (ref 0–99)
Total CHOL/HDL Ratio: 4.4 Ratio
Triglycerides: 230 mg/dL — ABNORMAL HIGH (ref <150)
VLDL: 46 mg/dL — ABNORMAL HIGH (ref 0–40)

## 2013-09-21 LAB — POCT GLYCOSYLATED HEMOGLOBIN (HGB A1C): Hemoglobin A1C: 5.8

## 2013-09-21 MED ORDER — LISINOPRIL 10 MG PO TABS: 10.0000 mg | ORAL_TABLET | Freq: Every day | ORAL | Status: DC

## 2013-09-21 MED ORDER — SERTRALINE HCL 25 MG PO TABS: ORAL_TABLET | ORAL | Status: DC

## 2013-09-21 MED ORDER — SIMVASTATIN 20 MG PO TABS: 20.0000 mg | ORAL_TABLET | Freq: Every day | ORAL | Status: DC

## 2013-09-21 MED ORDER — "XEROFORM PETROLAT GAUZE 5""X9"" EX MISC": 1.0000 "application " | CUTANEOUS | Status: DC

## 2013-09-21 MED ORDER — TENDERWRAP UNNA BOOT EX MISC: 1.0000 | CUTANEOUS | Status: DC | PRN

## 2013-09-21 MED ORDER — GLIMEPIRIDE 4 MG PO TABS: 4.0000 mg | ORAL_TABLET | Freq: Every day | ORAL | Status: DC

## 2013-09-21 NOTE — Patient Instructions (Signed)
Nicholas Hardin is doing great.  He has lost 30 lbs in the past year and 10 lbs in the past 4 months due to all his walking and regular meal times. Therefore, we can start backing off on his medication.  We will decrease his diabetes medication glimepiride from 4 mg twice daily to just 4 mg once daily with breakfast. We have stopped his lisinopril-hctz 10/12.5 and started him just on lisinopril 10 (took out the hctz portion). He is going to continue to have problems with his leg for the rest of his life but you are taking REMARKABLY WONDERFUL care of them - they look GREAT!  We will need to plan to continue unna boot application for 3d every 2-3 weeks - a prescription for unna boots and xeroform gauze (the yellow petroleum impregnated gauze) was sent to your pharmacy.  If this is still expensive, try checking the good rx website to see where is cheapest or consider calling [facility] to see if they stock them.   Venous Stasis or Chronic Venous Insufficiency Chronic venous insufficiency, also called venous stasis, is a condition that affects the veins in the legs. The condition prevents blood from being pumped through these veins effectively. Blood may no longer be pumped effectively from the legs back to the heart. This condition can range from mild to severe. With proper treatment, you should be able to continue with an active life. CAUSES  Chronic venous insufficiency occurs when the vein walls become stretched, weakened, or damaged or when valves within the vein are damaged. Some common causes of this include:  High blood pressure inside the veins (venous hypertension).  Increased blood pressure in the leg veins from long periods of sitting or standing.  A blood clot that blocks blood flow in a vein (deep vein thrombosis).  Inflammation of a superficial vein (phlebitis) that causes a blood clot to form. RISK FACTORS Various things can make you more likely to develop chronic venous insufficiency,  including:  Family history of this condition.  Obesity.  Pregnancy.  Sedentary lifestyle.  Smoking.  Jobs requiring long periods of standing or sitting in one place.  Being a certain age. Women in their 55s and 61s and men in their 43s are more likely to develop this condition. SIGNS AND SYMPTOMS  Symptoms may include:   Varicose veins.  Skin breakdown or ulcers.  Reddened or discolored skin on the leg.  Brown, smooth, tight, and painful skin just above the ankle, usually on the inside surface (lipodermatosclerosis).  Swelling. DIAGNOSIS  To diagnose this condition, your health care provider will take a medical history and do a physical exam. The following tests may be ordered to confirm the diagnosis:  Duplex ultrasound--A procedure that produces a picture of a blood vessel and nearby organs and also provides information on blood flow through the blood vessel.  Plethysmography--A procedure that tests blood flow.  A venogram, or venography--A procedure used to look at the veins using X-ray and dye. TREATMENT The goals of treatment are to help you return to an active life and to minimize pain or disability. Treatment will depend on the severity of the condition. Medical procedures may be needed for severe cases. Treatment options may include:   Use of compression stockings. These can help with symptoms and lower the chances of the problem getting worse, but they do not cure the problem.  Sclerotherapy--A procedure involving an injection of a material that "dissolves" the damaged veins. Other veins in the network of  blood vessels take over the function of the damaged veins.  Surgery to remove the vein or cut off blood flow through the vein (vein stripping or laser ablation surgery).  Surgery to repair a valve. HOME CARE INSTRUCTIONS   Wear compression stockings as directed by your health care provider.  Only take over-the-counter or prescription medicines for pain,  discomfort, or fever as directed by your health care provider.  Follow up with your health care provider as directed. SEEK MEDICAL CARE IF:   You have redness, swelling, or increasing pain in the affected area.  You see a red streak or line that extends up or down from the affected area.  You have a breakdown or loss of skin in the affected area, even if the breakdown is small.  You have an injury to the affected area. SEEK IMMEDIATE MEDICAL CARE IF:   You have an injury and open wound in the affected area.  Your pain is severe and does not improve with medicine.  You have sudden numbness or weakness in the foot or ankle below the affected area, or you have trouble moving your foot or ankle.  You have a fever or persistent symptoms for more than 2-3 days.  You have a fever and your symptoms suddenly get worse. MAKE SURE YOU:   Understand these instructions.  Will watch your condition.  Will get help right away if you are not doing well or get worse. Document Released: 05/24/2006 Document Revised: 11/08/2012 Document Reviewed: 09/25/2012 [facility] Patient Information 2015 [employer], [place]. This information is not intended to replace advice given to you by your health care provider. Make sure you discuss any questions you have with your health care provider.

## 2013-09-30 ENCOUNTER — Encounter: Payer: Self-pay | Admitting: Family Medicine

## 2013-09-30 NOTE — Progress Notes (Signed)
Subjective:    Patient ID: Nicholas Hardin, male    DOB: 1945/10/03, 68 y.o.   MRN: [id number] This chart was scribed for Sherren Mocha, MD by Gwenevere Abbot, ED scribe. This patient was seen in room Room/bed 21 and the patient's care was started at 8:33 AM.  Chief Complaint  Patient presents with  . Follow-up    4 months     HPI HPI Comments:  Nicholas Hardin is a 68 y.o. male who presents to [facility] with a h/o congenital mental retardation and is now living with a family friend, after his cousin passed away who cared for him his entire life. He had some depression with this and was put on Zoloft 25 mg and responded well. He also was going to start adult daycare, and his caregiver was going to apply for disability due to his cognitive deficits. He has lost 30 lbs since his cousin died, and his blood pressure has been steadily trending down as well. Nicholas Hardin had been on iron supplement for years, for an unknown reason, and we stopped that 9 months ago. His vitamin B12 is being replaced more than adequately with oral medications.   Weight Loss: Nicholas Hardin reports that he has been walking 10 to 11 times a day for exercise. He also reports that he has stopped eating sweets and drinking sodas, however, he occasionally has potato chips.  Adult daycare: Pt reports that he did not start adult daycare, because it was making him nervous. He states that he likes staying home because he is able to walk.   Legs: Family friend reports that pt's legs have been weeping, with a mixture of clear fluid and red. Family friend applied the [employer] one month ago, but they have been paying for them out of pocket.   Mood: Pt reports that his mood has been alright, and that he has not cried lately. Pt reports that he has not been depressed.  Eyes: Pt reports that he visited his eye doctor in 04/2013 and that he has another appointment scheduled for 10/2013.   Falls: Pt reports that he has fallen once.  Continence: Pt  reports that he is continent.   Past Medical History  Diagnosis Date  . Diabetes mellitus without complication   . Hypertension   . Cognitive deficits   . Hyperlipidemia     Current Outpatient Prescriptions on File Prior to Visit  Medication Sig Dispense Refill  . albuterol (PROVENTIL HFA;VENTOLIN HFA) 108 (90 BASE) MCG/ACT inhaler Inhale 2 puffs into the lungs every 6 (six) hours as needed for wheezing or shortness of breath.  1 Inhaler  2  . ammonium lactate (AMLACTIN) 12 % cream Apply 1 Bottle topically daily as needed (apply to sores on legs for healing).      Marland Kitchen aspirin 81 MG chewable tablet Chew 81 mg by mouth at bedtime.      Marland Kitchen atenolol (TENORMIN) 25 MG tablet Take 1 tablet (25 mg total) by mouth daily.  30 tablet  4  . Cyanocobalamin (B-12) 500 MCG TABS Take 500 mcg by mouth daily.       . Multiple Vitamin (MULTIVITAMIN) tablet Take 1 tablet by mouth daily.      Marland Kitchen OVER THE COUNTER MEDICATION Take 1 tablet by mouth at bedtime. "Leg cramps pill"      . mupirocin ointment (BACTROBAN) 2 % Place 1 application into the nose 2 (two) times daily.  22 g  0  . zoster  vaccine live, PF, (ZOSTAVAX) 81191 UNT/0.65ML injection Inject 19,400 Units into the skin once.  1 each  0   No current facility-administered medications on file prior to visit.   Allergies  Allergen Reactions  . Sulfa Antibiotics Hives    Review of Systems  Cardiovascular: Positive for leg swelling.      BP 100/52  Pulse 68  Temp(Src) 98.2 F (36.8 C) (Oral)  Resp 16  Ht  (1.651 m)  Wt 203 lb (92.08 kg)  BMI 33.78 kg/m2  SpO2 98% Objective:   Physical Exam  Nursing note and vitals reviewed. Constitutional: He is oriented to person, place, and time. He appears well-developed and well-nourished.  HENT:  Head: Normocephalic and atraumatic.  Eyes: EOM are normal.  Neck: Normal range of motion. Neck supple.  Cardiovascular: Normal rate, regular rhythm and normal heart sounds.   Difficult to hear    Pulmonary/Chest: Effort normal and breath sounds normal.  Musculoskeletal: Normal range of motion.  Neurological: He is alert and oriented to person, place, and time.  Skin: Skin is warm and dry. There is erythema.  Blanching erythema, left worse than right, small areas on each shin with the development of ulcers and skin break down. No trace pitting edema. Pulses not palpable.   Psychiatric: He has a normal mood and affect. His behavior is normal.          Assessment & Plan:  Unknown if pt received zoster vaccination previously. Unknown why pt is albuterol, but uses sporadically.  Schedule for a follow up 01/2014   Type II or unspecified type diabetes mellitus without mention of complication, not stated as uncontrolled - Plan: [facility] Diabetes Foot Exam, POCT glycosylated hemoglobin (Hb A1C) - A1c doing fantastic with pt weight loss. Will decrease glimepiride from twice a day to once a day.   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  Encounter for long-term (current) use of other medications  Obesity (BMI 30.0-34.9) - Plan: Lipid panel  Other and unspecified hyperlipidemia - Plan: Lipid panel  Bilateral edema of lower extremity - Plan: Wound Dressings (TENDERWRAP UNNA BOOT) MISC - Unna boots for venostasis ulceration. Family can care for at home, but is having trouble affording supplies.  Need for prophylactic vaccination and inoculation against influenza - Plan: Flu Vaccine QUAD 36+ mos IM  Meds ordered this encounter  Medications  . Wound Dressings (TENDERWRAP UNNA BOOT) MISC    Sig: Apply 1 each topically every three (3) days as needed.    Dispense:  10 each     Refill:  5    Ok to substitute with any brand of unna boot. Medically necessary for recurrent venous stasis ulcerations.  . Bismuth Tribromoph-Petrolatum (XEROFORM PETROLAT GAUZE 5"X9") MISC    Sig: Apply 1 application topically every other day.    Dispense:  50 each    Refill:  0    Medically necessary for recurrent venous stasis ulcerations.  Marland Kitchen lisinopril (PRINIVIL,ZESTRIL) 10 MG tablet    Sig: Take 1 tablet (10 mg total) by mouth daily.    Dispense:  30 tablet    Refill:  5    Stop lisinopril-hctz 10-12.5  . simvastatin (ZOCOR) 20 MG tablet    Sig: Take 1 tablet (  20 mg total) by mouth at bedtime.    Dispense:  30 tablet    Refill:  4  . sertraline (ZOLOFT) 25 MG tablet    Sig: TAKE 1 TABLET BY MOUTH EVERY DAY    Dispense:  30 tablet    Refill:  5  . glimepiride (AMARYL) 4 MG tablet    Sig: Take 1 tablet (4 mg total) by mouth daily with breakfast.    Dispense:  30 tablet    Refill:  5    Decrease glimepiride from bid to qd    I personally performed the services described in this documentation, which was scribed in my presence. The recorded information has been reviewed and considered, and addended by me as needed.  Norberto Sorenson, MD MPH

## 2013-10-01 ENCOUNTER — Encounter: Payer: Self-pay | Admitting: Family Medicine

## 2013-10-01 ENCOUNTER — Telehealth: Payer: Self-pay | Admitting: *Deleted

## 2013-10-01 NOTE — Telephone Encounter (Signed)
Phoned to speak with patient, spoke with Nicholas Hardin, explained reason for my call.  Pt was diagnosed last fall with cataracts and has had them both removed this year & other than possibly needing reading glasses, his eyes were fine.  Further stated patient had been advised blood glucoses were improving.  Unsure if patient has upcoming eye appt 10/31/13.  States Dr. Jacquiline Doe (opth @ [facility] should know).  Phoned [facility] [phone]) and spoke with Duwayne Heck and stated pt was referred to Dr. Mia [facility] [phone]) and spoke with [place] who is faxing over his reports.

## 2013-11-20 ENCOUNTER — Other Ambulatory Visit: Payer: Self-pay | Admitting: Family Medicine

## 2013-11-20 ENCOUNTER — Other Ambulatory Visit: Payer: Self-pay | Admitting: Emergency Medicine

## 2013-12-17 ENCOUNTER — Telehealth: Payer: Self-pay

## 2013-12-17 DIAGNOSIS — Z9181 History of falling: Secondary | ICD-10-CM

## 2013-12-17 NOTE — Telephone Encounter (Signed)
Please advise 

## 2013-12-17 NOTE — Telephone Encounter (Signed)
Fine to order bed rail due to fall risk

## 2013-12-17 NOTE — Telephone Encounter (Signed)
Nicholas Hardin is calling stating that the patient has fallen out of the bed 3 times, and they would like to discuss the possibility of a bed rail. Please advise

## 2013-12-18 ENCOUNTER — Ambulatory Visit: Payer: Medicare Other

## 2013-12-19 ENCOUNTER — Encounter: Payer: Self-pay | Admitting: Podiatry

## 2013-12-19 ENCOUNTER — Ambulatory Visit (INDEPENDENT_AMBULATORY_CARE_PROVIDER_SITE_OTHER): Payer: Medicare Other | Admitting: Podiatry

## 2013-12-19 VITALS — BP 139/73 | HR 58 | Resp 18

## 2013-12-19 DIAGNOSIS — B351 Tinea unguium: Secondary | ICD-10-CM

## 2013-12-19 DIAGNOSIS — M79676 Pain in unspecified toe(s): Secondary | ICD-10-CM

## 2013-12-19 NOTE — Patient Instructions (Signed)
Apply topical antibiotic ointment daily to the fourth right toenail area, cover with a Band-Aid until healed

## 2013-12-20 NOTE — Progress Notes (Signed)
Patient ID: Nicholas Hardin, male   DOB: 09-03-1945, 68 y.o.   MRN: [id number]  Subjective: This patient presents complaining of painful toenails. The last visit for a similar service was on 09/07/2013 by Dr. Ralene CorkSikora. He presents with his cousin Nicholas Hardin who he lives with.  Objective: A small amount of bleeding was noted prior to debridement along the medial margin of the fourth right toenail area. There is no erythema, edema, drainage surrounding the area  The toenails are elongated, hypertrophic, discolored 6-10  Assessment: Symptomatic onychomycoses 6-10 Noninfected bleeding site fourth right toe most likely from friction and rub  Plan: Debrided toenails 10 without a bleeding Advised patient apply topical antibiotic ointment to the fourth right toenail area daily until healed  Reappoint at three-month intervals

## 2013-12-24 ENCOUNTER — Ambulatory Visit: Payer: Medicare Other | Admitting: Podiatry

## 2014-01-10 ENCOUNTER — Observation Stay (HOSPITAL_COMMUNITY)
Admission: EM | Admit: 2014-01-10 | Discharge: 2014-01-12 | Disposition: A | Discharge status: Home / Self Care | Payer: Medicare Other | Attending: Internal Medicine | Admitting: Internal Medicine

## 2014-01-10 ENCOUNTER — Emergency Department (HOSPITAL_COMMUNITY): Payer: Medicare Other

## 2014-01-10 ENCOUNTER — Encounter (HOSPITAL_COMMUNITY): Payer: Self-pay

## 2014-01-10 DIAGNOSIS — R112 Nausea with vomiting, unspecified: Secondary | ICD-10-CM | POA: Diagnosis present

## 2014-01-10 DIAGNOSIS — Z7982 Long term (current) use of aspirin: Secondary | ICD-10-CM | POA: Diagnosis not present

## 2014-01-10 DIAGNOSIS — I10 Essential (primary) hypertension: Secondary | ICD-10-CM | POA: Diagnosis not present

## 2014-01-10 DIAGNOSIS — E785 Hyperlipidemia, unspecified: Secondary | ICD-10-CM | POA: Insufficient documentation

## 2014-01-10 DIAGNOSIS — R1110 Vomiting, unspecified: Secondary | ICD-10-CM

## 2014-01-10 DIAGNOSIS — H5010 Unspecified exotropia: Secondary | ICD-10-CM | POA: Insufficient documentation

## 2014-01-10 DIAGNOSIS — R110 Nausea: Secondary | ICD-10-CM

## 2014-01-10 DIAGNOSIS — E119 Type 2 diabetes mellitus without complications: Secondary | ICD-10-CM | POA: Diagnosis not present

## 2014-01-10 DIAGNOSIS — Z79899 Other long term (current) drug therapy: Secondary | ICD-10-CM | POA: Diagnosis not present

## 2014-01-10 LAB — I-STAT CG4 LACTIC ACID, ED: LACTIC ACID, VENOUS: 0.94 mmol/L (ref 0.5–2.2)

## 2014-01-10 LAB — COMPREHENSIVE METABOLIC PANEL
ALT: 14 U/L (ref 0–53)
AST: 22 U/L (ref 0–37)
Albumin: 4.1 g/dL (ref 3.5–5.2)
Alkaline Phosphatase: 60 U/L (ref 39–117)
Anion gap: 15 (ref 5–15)
BILIRUBIN TOTAL: 0.6 mg/dL (ref 0.3–1.2)
BUN: 21 mg/dL (ref 6–23)
CHLORIDE: 104 meq/L (ref 96–112)
CO2: 24 mEq/L (ref 19–32)
Calcium: 9.6 mg/dL (ref 8.4–10.5)
Creatinine, Ser: 0.94 mg/dL (ref 0.50–1.35)
GFR calc Af Amer: >90 mL/min (ref >90)
GFR calc non Af Amer: 85 mL/min — ABNORMAL LOW (ref >90)
Glucose, Bld: 149 mg/dL — ABNORMAL HIGH (ref 70–99)
POTASSIUM: 4.2 meq/L (ref 3.7–5.3)
SODIUM: 143 meq/L (ref 137–147)
TOTAL PROTEIN: 7.3 g/dL (ref 6.0–8.3)

## 2014-01-10 LAB — LIPASE, BLOOD: LIPASE: 24 U/L (ref 11–59)

## 2014-01-10 LAB — CBC
HEMATOCRIT: 38 % — AB (ref 39.0–52.0)
Hemoglobin: 12.5 g/dL — ABNORMAL LOW (ref 13.0–17.0)
MCH: 28.3 pg (ref 26.0–34.0)
MCHC: 32.9 g/dL (ref 30.0–36.0)
MCV: 86 fL (ref 78.0–100.0)
Platelets: 229 10*3/uL (ref 150–400)
RBC: 4.42 MIL/uL (ref 4.22–5.81)
RDW: 13.1 % (ref 11.5–15.5)
WBC: 9.8 10*3/uL (ref 4.0–10.5)

## 2014-01-10 LAB — HEPATIC FUNCTION PANEL
ALBUMIN: 3.7 g/dL (ref 3.5–5.2)
ALK PHOS: 57 U/L (ref 39–117)
ALT: 11 U/L (ref 0–53)
AST: 18 U/L (ref 0–37)
Total Bilirubin: 0.5 mg/dL (ref 0.3–1.2)
Total Protein: 6.7 g/dL (ref 6.0–8.3)

## 2014-01-10 LAB — I-STAT TROPONIN, ED: Troponin i, poc: 0 ng/mL (ref 0.00–0.08)

## 2014-01-10 LAB — GLUCOSE, CAPILLARY: GLUCOSE-CAPILLARY: 94 mg/dL (ref 70–99)

## 2014-01-10 LAB — CBG MONITORING, ED: Glucose-Capillary: 142 mg/dL — ABNORMAL HIGH (ref 70–99)

## 2014-01-10 MED ORDER — DOCUSATE SODIUM 100 MG PO CAPS
100.0000 mg | ORAL_CAPSULE | Freq: Two times a day (BID) | ORAL | Status: DC
  Administered 2014-01-10 – 2014-01-12 (×4): 100 mg via ORAL
  Filled 2014-01-10 (×5): qty 1

## 2014-01-10 MED ORDER — ENOXAPARIN SODIUM 40 MG/0.4ML ~~LOC~~ SOLN
40.0000 mg | SUBCUTANEOUS | Status: DC
  Administered 2014-01-10 – 2014-01-11 (×2): 40 mg via SUBCUTANEOUS
  Filled 2014-01-10 (×3): qty 0.4

## 2014-01-10 MED ORDER — SENNA 8.6 MG PO TABS
2.0000 | ORAL_TABLET | Freq: Every day | ORAL | Status: DC
  Administered 2014-01-10 – 2014-01-12 (×3): 17.2 mg via ORAL
  Filled 2014-01-10 (×3): qty 2

## 2014-01-10 MED ORDER — ADULT MULTIVITAMIN W/MINERALS CH
1.0000 | ORAL_TABLET | Freq: Every day | ORAL | Status: DC
  Administered 2014-01-11 – 2014-01-12 (×2): 1 via ORAL
  Filled 2014-01-10 (×2): qty 1

## 2014-01-10 MED ORDER — ALBUTEROL SULFATE HFA 108 (90 BASE) MCG/ACT IN AERS: 2.0000 | INHALATION_SPRAY | Freq: Four times a day (QID) | RESPIRATORY_TRACT | Status: DC | PRN

## 2014-01-10 MED ORDER — SODIUM CHLORIDE 0.9 % IV SOLN
INTRAVENOUS | Status: DC
  Administered 2014-01-10 – 2014-01-12 (×4): via INTRAVENOUS

## 2014-01-10 MED ORDER — FLEET ENEMA 7-19 GM/118ML RE ENEM
1.0000 | ENEMA | Freq: Once | RECTAL | Status: AC
  Administered 2014-01-10: 1 via RECTAL
  Filled 2014-01-10: qty 1

## 2014-01-10 MED ORDER — IOHEXOL 300 MG/ML  SOLN
100.0000 mL | Freq: Once | INTRAMUSCULAR | Status: AC | PRN
  Administered 2014-01-10: 100 mL via INTRAVENOUS

## 2014-01-10 MED ORDER — SODIUM CHLORIDE 0.9 % IV BOLUS (SEPSIS)
1000.0000 mL | Freq: Once | INTRAVENOUS | Status: AC
  Administered 2014-01-10: 1000 mL via INTRAVENOUS

## 2014-01-10 MED ORDER — DEXTROSE 5 % IV SOLN
1.0000 g | Freq: Once | INTRAVENOUS | Status: AC
  Administered 2014-01-10: 1 g via INTRAVENOUS
  Filled 2014-01-10: qty 10

## 2014-01-10 MED ORDER — ALBUTEROL SULFATE (2.5 MG/3ML) 0.083% IN NEBU: 2.5000 mg | INHALATION_SOLUTION | Freq: Four times a day (QID) | RESPIRATORY_TRACT | Status: DC | PRN

## 2014-01-10 MED ORDER — SERTRALINE HCL 25 MG PO TABS
25.0000 mg | ORAL_TABLET | Freq: Every day | ORAL | Status: DC
  Administered 2014-01-11 – 2014-01-12 (×2): 25 mg via ORAL
  Filled 2014-01-10 (×2): qty 1

## 2014-01-10 MED ORDER — ACETAMINOPHEN 650 MG RE SUPP: 650.0000 mg | Freq: Four times a day (QID) | RECTAL | Status: DC | PRN

## 2014-01-10 MED ORDER — DEXTROSE 5 % IV SOLN
500.0000 mg | Freq: Once | INTRAVENOUS | Status: AC
  Administered 2014-01-10: 500 mg via INTRAVENOUS
  Filled 2014-01-10: qty 500

## 2014-01-10 MED ORDER — CYANOCOBALAMIN 500 MCG PO TABS
500.0000 ug | ORAL_TABLET | Freq: Every day | ORAL | Status: DC
  Administered 2014-01-11 – 2014-01-12 (×2): 500 ug via ORAL
  Filled 2014-01-10 (×3): qty 1

## 2014-01-10 MED ORDER — INSULIN ASPART 100 UNIT/ML ~~LOC~~ SOLN
0.0000 [IU] | Freq: Three times a day (TID) | SUBCUTANEOUS | Status: DC
  Administered 2014-01-11: 3 [IU] via SUBCUTANEOUS
  Administered 2014-01-12: 1 [IU] via SUBCUTANEOUS

## 2014-01-10 MED ORDER — ONDANSETRON HCL 4 MG PO TABS: 4.0000 mg | ORAL_TABLET | Freq: Four times a day (QID) | ORAL | Status: DC | PRN

## 2014-01-10 MED ORDER — SIMVASTATIN 20 MG PO TABS
20.0000 mg | ORAL_TABLET | Freq: Every day | ORAL | Status: DC
  Administered 2014-01-10 – 2014-01-11 (×2): 20 mg via ORAL
  Filled 2014-01-10 (×3): qty 1

## 2014-01-10 MED ORDER — ONDANSETRON HCL 4 MG/2ML IJ SOLN: 4.0000 mg | Freq: Four times a day (QID) | INTRAMUSCULAR | Status: DC | PRN

## 2014-01-10 MED ORDER — ONDANSETRON HCL 4 MG/2ML IJ SOLN
4.0000 mg | Freq: Once | INTRAMUSCULAR | Status: AC
  Administered 2014-01-10: 4 mg via INTRAVENOUS
  Filled 2014-01-10: qty 2

## 2014-01-10 MED ORDER — ACETAMINOPHEN 325 MG PO TABS
650.0000 mg | ORAL_TABLET | Freq: Four times a day (QID) | ORAL | Status: DC | PRN
Start: 2014-01-10 — End: 2014-01-12

## 2014-01-10 MED ORDER — ASPIRIN 81 MG PO CHEW
81.0000 mg | CHEWABLE_TABLET | Freq: Every day | ORAL | Status: DC
  Administered 2014-01-10 – 2014-01-11 (×2): 81 mg via ORAL
  Filled 2014-01-10 (×4): qty 1

## 2014-01-10 NOTE — ED Notes (Signed)
Bed: [id number] Expected date:  Expected time:  Means of arrival:  Comments: Pt from triage

## 2014-01-10 NOTE — ED Notes (Signed)
Pt attempted to urinate but was unsuccessful. Pt stated that he will try again in a little while.

## 2014-01-10 NOTE — ED Notes (Signed)
Patient transported to CT 

## 2014-01-10 NOTE — ED Notes (Signed)
Attempted to in and out cath pt but was unsuccessful in retrieving urine. RN, Shanda BumpsJessica tried as well and there was no urine obtained. RN will notify MD.

## 2014-01-10 NOTE — ED Provider Notes (Signed)
CSN: [id number]     Arrival date & time 01/10/14  1151 History   First MD Initiated Contact with Patient 01/10/14 1156     Chief Complaint  Patient presents with  . Emesis     (Consider location/radiation/quality/duration/timing/severity/associated sxs/prior Treatment) Patient is a 68 y.o. male presenting with vomiting. The history is provided by the patient.  Emesis Severity:  Moderate Timing:  Constant Quality:  Stomach contents and malodorous material Progression:  Worsening Chronicity:  New Recent urination:  Normal Relieved by:  Nothing Worsened by:  Nothing tried Associated symptoms: no abdominal pain, no diarrhea, no fever and no URI     Past Medical History  Diagnosis Date  . Diabetes mellitus without complication   . Hypertension   . Cognitive deficits   . Hyperlipidemia    Past Surgical History  Procedure Laterality Date  . Dental surgery      had all teeth removed  . Cholecystectomy N/A 09/06/2012    Procedure: LAPAROSCOPIC CHOLECYSTECTOMY WITH INTRAOPERATIVE CHOLANGIOGRAM;  Surgeon: Robyne AskewPaul S Toth III, MD;  Location: [facility] OR;  Service: General;  Laterality: N/A;  [place]. Gall blader N/A     09/06/2012   Family History  Problem Relation Age of Onset  . Cancer Father   . Heart attack Paternal Grandfather   . Kidney disease Paternal Aunt    History  Substance Use Topics  . Smoking status: Never Smoker   . Smokeless tobacco: Never Used  . Alcohol Use: No    Review of Systems  Constitutional: Negative for fever.  Respiratory: Negative for cough and shortness of breath.   Cardiovascular: Negative for chest pain and leg swelling.  Gastrointestinal: Positive for nausea and vomiting. Negative for abdominal pain and diarrhea.  All other systems reviewed and are negative.     Allergies  Sulfa antibiotics  Home Medications   Prior to Admission medications   Medication Sig Start Date End Date Taking? Authorizing Provider  aspirin 81 MG chewable tablet Chew 81  mg by mouth at bedtime.   Yes Historical Provider, MD  atenolol (TENORMIN) 25 MG tablet TAKE 1 TABLET BY MOUTH EVERY DAY 11/21/13  Yes Sherren MochaEva N Shaw, MD  Cyanocobalamin (B-12) 500 MCG TABS Take 500 mcg by mouth daily.    Yes Historical Provider, MD  glimepiride (AMARYL) 4 MG tablet Take 1 tablet (4 mg total) by mouth daily with breakfast. 09/21/13  Yes Sherren MochaEva N Shaw, MD  lisinopril (PRINIVIL,ZESTRIL) 10 MG tablet Take 1 tablet (10 mg total) by mouth daily. 09/21/13  Yes Sherren MochaEva N Shaw, MD  Multiple Vitamin (MULTIVITAMIN) tablet Take 1 tablet by mouth daily.   Yes Historical Provider, MD  OVER THE COUNTER MEDICATION Take 1 tablet by mouth at bedtime. "Leg cramps pill"   Yes Historical Provider, MD  sertraline (ZOLOFT) 25 MG tablet TAKE 1 TABLET BY MOUTH EVERY DAY 09/21/13  Yes Sherren MochaEva N Shaw, MD  simvastatin (ZOCOR) 20 MG tablet Take 1 tablet (20 mg total) by mouth at bedtime. 09/21/13  Yes Sherren MochaEva N Shaw, MD  albuterol (PROVENTIL HFA;VENTOLIN HFA) 108 (90 BASE) MCG/ACT inhaler Inhale 2 puffs into the lungs every 6 (six) hours as needed for wheezing or shortness of breath. 03/22/13   Eleanore Delia ChimesE Egan, PA-C  ammonium lactate (AMLACTIN) 12 % cream APPLY TOPICALLY TO AFFECTED TWICE DAILY 11/21/13   Sherren MochaEva N Shaw, MD  Bismuth Tribromoph-Petrolatum (XEROFORM PETROLAT GAUZE 5"X9") MISC Apply 1 application topically every other day. 09/21/13   Sherren MochaEva N Shaw, MD  mupirocin ointment Idelle Jo(BACTROBAN)  2 % Place 1 application into the nose 2 (two) times daily. 01/31/13   Collene Gobble, MD  Wound Dressings (TENDERWRAP Henriette Combs) MISC Apply 1 each topically every three (3) days as needed. 09/21/13   Sherren Mocha, MD  zoster vaccine live, PF, (ZOSTAVAX) 16109 UNT/0.65ML injection Inject 19,400 Units into the skin once. Patient not taking: Reported on 01/10/2014 12/15/12   Sherren Mocha, MD   BP 122/48 mmHg  Pulse 55  Resp 27  SpO2 96% Physical Exam  Constitutional: He is oriented to person, place, and time. He appears well-developed and  well-nourished. No distress.  HENT:  Head: Normocephalic and atraumatic.  Mouth/Throat: No oropharyngeal exudate.  L eye exotropia  Eyes: EOM are normal. Pupils are equal, round, and reactive to light.  Neck: Normal range of motion. Neck supple.  Cardiovascular: Normal rate and regular rhythm.  Exam reveals no friction rub.   No murmur heard. Pulmonary/Chest: Effort normal and breath sounds normal. No respiratory distress. He has no wheezes. He has no rales.  Abdominal: He exhibits no distension. There is no tenderness. There is no rebound.  Musculoskeletal: Normal range of motion. He exhibits no edema.  Neurological: He is alert and oriented to person, place, and time. No cranial nerve deficit. He exhibits normal muscle tone. Coordination normal.  Skin: No rash noted. He is not diaphoretic.  Nursing note and vitals reviewed.   ED Course  Procedures (including critical care time) Labs Review Labs Reviewed  CBG MONITORING, ED - Abnormal; Notable for the following:    Glucose-Capillary 142 (*)    All other components within normal limits  CBC  COMPREHENSIVE METABOLIC PANEL  LIPASE, BLOOD  URINALYSIS, ROUTINE W REFLEX MICROSCOPIC  I-STAT TROPOININ, ED    Imaging Review Dg Chest 2 View  01/10/2014   CLINICAL DATA:  Vomiting.  EXAM: CHEST  2 VIEW  COMPARISON:  08/30/2012.  FINDINGS: Mediastinum and hilar structures are normal. Subsequent atelectasis and/or infiltrate right lung base. Cardiomegaly with normal pulmonary vascularity. No pneumothorax. No acute bony abnormality appear  IMPRESSION: 1.  Mild right lung base subsegmental atelectasis and or infiltrate.  2.  Stable cardiomegaly.  No CHF.   Electronically Signed   By: Maisie Fus  Register   On: 01/10/2014 13:03   Ct Abdomen Pelvis W Contrast  01/10/2014   CLINICAL DATA:  Vomiting  EXAM: CT ABDOMEN AND PELVIS WITH CONTRAST  TECHNIQUE: Multidetector CT imaging of the abdomen and pelvis was performed using the standard protocol  following bolus administration of intravenous contrast.  CONTRAST:  OMNIPAQUE IOHEXOL 300 MG/ML  SOLN  COMPARISON:  None.  FINDINGS: Lung bases are clear. Mild cardiac enlargement. Mild pericardial thickening.  Liver and spleen are normal. Gallbladder surgically absent. Pancreas is normal.  No renal obstruction or mass. Nonobstructing 3 x 4 mm left renal calculus. 10 mm right renal cyst. Ureters are nondilated. Urinary bladder is normal. Prostate not enlarged.  Constipation with stool throughout the colon. Rectal impaction. Negative for bowel obstruction or thickening. No free fluid.  IMPRESSION: Constipation with rectal impaction. Negative for bowel obstruction. Otherwise no acute abnormality.   Electronically Signed   By: Marlan Palau M.D.   On: 01/10/2014 14:48     EKG Interpretation   Date/Time:  Thursday January 10 2014 11:58:54 EST Ventricular Rate:  51 PR Interval:  187 QRS Duration: 100 QT Interval:  483 QTC Calculation: 445 R Axis:   19 Text Interpretation:  Sinus rhythm Similar to prior Confirmed by [facility]  MD, Carlena SaxBLAIR [phone]([id number]) on 01/10/2014 12:02:04 PM      MDM   Final diagnoses:  Nausea  Vomiting    39M here with vomiting, began this morning. Denies any abdominal pain, diarrhea, SOB, CP. Here with stable vitals. Last BM yesterday. Concern for foul-smelling vomiting. Will CT his abdomen. Labs ok. CT shows constipation/impaction. No SBO.  Rectal exam with very soft stool. Will give enema. CXR shows infiltrate. Will treat for CA. Plan for admission.  I have reviewed all labs and imaging and considered them in my medical decision making.   Elwin MochaBlair Caidence Higashi, MD 01/11/14 [id number]

## 2014-01-10 NOTE — ED Notes (Addendum)
Pt states vomiting started this morning.  Was out walking.  No fever.  No abdominal pain.  Abdomen is soft.  Emesis is brown and with foul odor.  Pt is diaphoretic on assessment.  CBG 142.  BP stable.  Pt states normal bm 1-2 times per week and had normal bm yesterday.  No diarrhea

## 2014-01-10 NOTE — H&P (Signed)
History and Physical  Nicholas Hardin:[id number] DOB: Jan 05, 1946 DOA: 01/10/2014   PCP: Norberto Sorenson, MD   Chief Complaint:  Recurrent vomiting HPI:  68 year old male with a history of diabetes mellitus, hypertension, cognitive impairment, hyperlipidemia presents with one-day history of nausea and vomiting with associated abdominal pain. The patient had approximately 4 episodes of emesis without any blood on the day of admission. The patient was in usual state of health prior to today. Notably, the patient has been constipated and has not had a bowel movement in over 2 weeks. The patient denies any fevers, chills, chest pain, shortness breath, coughing, hemoptysis, hematochezia, melena.  In emergency department, BMP and CBC were unremarkable. Chest x-ray showed right lower lobe atelectasis versus infiltrate. CT of the abdomen and pelvis was negative for any acute findings except for constipation and stool impaction. There was no bowel obstruction. Hepatic enzymes and lipase were negative. EKG was sinus rhythm without ST changes. Lactic acid was 0.94. The patient received 2 L normal saline in the emergency department Assessment/Plan:  Intractable nausea and vomiting/dehydration -Likely related to the patient's constipation -A fleets enema has been ordered in the emergency department -Start bowel regimen including Colace and Senokot -per EDP, rectal exam was performed and revealed soft stool without impaction -Clear liquid diet, advance as tolerated -IV fluids -UA and urine culture Right lower lobe atelectasis/infiltrate -I do not believe that the patient has pneumonia clinically -There is no fever, leukocytosis, hypoxemia, shortness of breath or coughing -Observe the patient clinically Diabetes mellitus type 2 -Hemoglobin A1c -NovoLog sliding scale Hypertension -Hold atenolol and lisinopril as the patient's blood pressure is soft -IV fluids Hyperlipidemia -Continue  Zocor       Past Medical History  Diagnosis Date  . Diabetes mellitus without complication   . Hypertension   . Cognitive deficits   . Hyperlipidemia    Past Surgical History  Procedure Laterality Date  . Dental surgery      had all teeth removed  . Cholecystectomy N/A 09/06/2012    Procedure: LAPAROSCOPIC CHOLECYSTECTOMY WITH INTRAOPERATIVE CHOLANGIOGRAM;  Surgeon: Robyne Askew, MD;  Location: [facility] OR;  Service: General;  Laterality: N/A;  . Gall blader N/A     09/06/2012   Social History:  reports that he has never smoked. He has never used smokeless tobacco. He reports that he does not drink alcohol or use illicit drugs.   Family History  Problem Relation Age of Onset  . Cancer Father   . Heart attack Paternal Grandfather   . Kidney disease Paternal Aunt      Allergies  Allergen Reactions  . Sulfa Antibiotics Hives      Prior to Admission medications   Medication Sig Start Date End Date Taking? Authorizing Provider  aspirin 81 MG chewable tablet Chew 81 mg by mouth at bedtime.   Yes Historical Provider, MD  atenolol (TENORMIN) 25 MG tablet TAKE 1 TABLET BY MOUTH EVERY DAY 11/21/13  Yes Sherren Mocha, MD  Cyanocobalamin (B-12) 500 MCG TABS Take 500 mcg by mouth daily.    Yes Historical Provider, MD  glimepiride (AMARYL) 4 MG tablet Take 1 tablet (4 mg total) by mouth daily with breakfast. 09/21/13  Yes Sherren Mocha, MD  lisinopril (PRINIVIL,ZESTRIL) 10 MG tablet Take 1 tablet (10 mg total) by mouth daily. 09/21/13  Yes Sherren Mocha, MD  Multiple Vitamin (MULTIVITAMIN) tablet Take 1 tablet by mouth daily.   Yes Historical Provider, MD  OVER THE  COUNTER MEDICATION Take 1 tablet by mouth at bedtime. "Leg cramps pill"   Yes Historical Provider, MD  sertraline (ZOLOFT) 25 MG tablet TAKE 1 TABLET BY MOUTH EVERY DAY 09/21/13  Yes Sherren MochaEva N Shaw, MD  simvastatin (ZOCOR) 20 MG tablet Take 1 tablet (20 mg total) by mouth at bedtime. 09/21/13  Yes Sherren MochaEva N Shaw, MD  albuterol (PROVENTIL  HFA;VENTOLIN HFA) 108 (90 BASE) MCG/ACT inhaler Inhale 2 puffs into the lungs every 6 (six) hours as needed for wheezing or shortness of breath. 03/22/13   Eleanore Delia ChimesE Egan, PA-C  ammonium lactate (AMLACTIN) 12 % cream APPLY TOPICALLY TO AFFECTED TWICE DAILY 11/21/13   Sherren MochaEva N Shaw, MD  Bismuth Tribromoph-Petrolatum (XEROFORM PETROLAT GAUZE 5"X9") MISC Apply 1 application topically every other day. 09/21/13   Sherren MochaEva N Shaw, MD  mupirocin ointment (BACTROBAN) 2 % Place 1 application into the nose 2 (two) times daily. 01/31/13   Collene GobbleSteven A Daub, MD  Wound Dressings (TENDERWRAP Henriette CombsUNNA BOOT) MISC Apply 1 each topically every three (3) days as needed. 09/21/13   Sherren MochaEva N Shaw, MD  zoster vaccine live, PF, (ZOSTAVAX) [id number] UNT/0.65ML injection Inject 19,400 Units into the skin once. Patient not taking: Reported on 01/10/2014 12/15/12   Sherren MochaEva N Shaw, MD    Review of Systems:  Constitutional:  No weight loss, night sweats, Head&Eyes: No headache.  No vision loss.  No eye pain or scotoma ENT:  No Difficulty swallowing,Tooth/dental problems,Sore throat,  No ear ache, post nasal drip,  Cardio-vascular:  No chest pain, Orthopnea, PND, swelling in lower extremities,   palpitations  GI:  No   diarrhea, loss of appetite, hematochezia, melena, heartburn, indigestion, Resp:  No shortness of breath with exertion or at rest. No cough. No coughing up of blood .No wheezing.No chest wall deformity  Skin:  no rash or lesions.  GU:  no dysuria, change in color of urine, no urgency or frequency. No flank pain.  Musculoskeletal:  No joint pain or swelling. No decreased range of motion. No back pain.  Psych:  No change in mood or affect. No depression or anxiety. Neurologic: No headache, no dysesthesia, no focal weakness, no vision loss. No syncope  Physical Exam: Filed Vitals:   01/10/14 1158 01/10/14 1435  BP: 122/48 129/56  Pulse: 55 59  Temp:  97.5 F (36.4 C)  TempSrc:  Oral  Resp: 27 18  SpO2: 96% 99%    General:  Alert and awake, NAD, nontoxic, pleasant/cooperative Head/Eye: No conjunctival hemorrhage, no icterus, [place]/AT, No nystagmus ENT:  No icterus,  No thrush, no pharyngeal exudate, edentulous Neck:  No masses, no lymphadenpathy, no bruits CV:  RRR, no rub, no gallop, no S3 Lung:  CTAB, good air movement, no wheeze, no rhonchi Abdomen: soft/NT, +BS, nondistended, no peritoneal signs Ext: No cyanosis, No rashes, No petechiae, No lymphangitis, trace LE edema   Labs on Admission:  Basic Metabolic Panel:  Recent Labs Lab 01/10/14 1310  NA 143  K 4.2  CL 104  CO2 24  GLUCOSE 149*  BUN 21  CREATININE 0.94  CALCIUM 9.6   Liver Function Tests:  Recent Labs Lab 01/10/14 1310  AST 22  ALT 14  ALKPHOS 60  BILITOT 0.6  PROT 7.3  ALBUMIN 4.1    Recent Labs Lab 01/10/14 1310  LIPASE 24   No results for input(s): AMMONIA in the last 168 hours. CBC:  Recent Labs Lab 01/10/14 1310  WBC 9.8  HGB 12.5*  HCT 38.0*  MCV 86.0  PLT 229   Cardiac Enzymes: No results for input(s): CKTOTAL, CKMB, CKMBINDEX, TROPONINI in the last 168 hours. BNP: Invalid input(s): POCBNP CBG:  Recent Labs Lab 01/10/14 1153  GLUCAP 142*    Radiological Exams on Admission: Dg Chest 2 View  01/10/2014   CLINICAL DATA:  Vomiting.  EXAM: CHEST  2 VIEW  COMPARISON:  08/30/2012.  FINDINGS: Mediastinum and hilar structures are normal. Subsequent atelectasis and/or infiltrate right lung base. Cardiomegaly with normal pulmonary vascularity. No pneumothorax. No acute bony abnormality appear  IMPRESSION: 1.  Mild right lung base subsegmental atelectasis and or infiltrate.  2.  Stable cardiomegaly.  No CHF.   Electronically Signed   By: Maisie Fushomas  Register   On: 01/10/2014 13:03   Ct Abdomen Pelvis W Contrast  01/10/2014   CLINICAL DATA:  Vomiting  EXAM: CT ABDOMEN AND PELVIS WITH CONTRAST  TECHNIQUE: Multidetector CT imaging of the abdomen and pelvis was performed using the standard protocol  following bolus administration of intravenous contrast.  CONTRAST:  100mL OMNIPAQUE IOHEXOL 300 MG/ML  SOLN  COMPARISON:  None.  FINDINGS: Lung bases are clear. Mild cardiac enlargement. Mild pericardial thickening.  Liver and spleen are normal. Gallbladder surgically absent. Pancreas is normal.  No renal obstruction or mass. Nonobstructing 3 x 4 mm left renal calculus. 10 mm right renal cyst. Ureters are nondilated. Urinary bladder is normal. Prostate not enlarged.  Constipation with stool throughout the colon. Rectal impaction. Negative for bowel obstruction or thickening. No free fluid.  IMPRESSION: Constipation with rectal impaction. Negative for bowel obstruction. Otherwise no acute abnormality.   Electronically Signed   By: Marlan Palauharles  Clark M.D.   On: 01/10/2014 14:48    EKG: Independently reviewed. Sinus rhythm, no ST-T wave changes    Time spent:60 minutes Code Status:   FULL Family Communication:   Family at bedside   Hildegard Hlavac, DO  [facility] Pager [phone]  If 7PM-7AM, please contact night-coverage [email or web address] Password [email or web address] 01/10/2014, 5:14 PM

## 2014-01-11 DIAGNOSIS — R1110 Vomiting, unspecified: Secondary | ICD-10-CM

## 2014-01-11 DIAGNOSIS — I10 Essential (primary) hypertension: Secondary | ICD-10-CM

## 2014-01-11 DIAGNOSIS — E785 Hyperlipidemia, unspecified: Secondary | ICD-10-CM

## 2014-01-11 DIAGNOSIS — E119 Type 2 diabetes mellitus without complications: Secondary | ICD-10-CM

## 2014-01-11 LAB — GLUCOSE, CAPILLARY
GLUCOSE-CAPILLARY: 221 mg/dL — AB (ref 70–99)
Glucose-Capillary: 84 mg/dL (ref 70–99)
Glucose-Capillary: 108 mg/dL — ABNORMAL HIGH (ref 70–99)
Glucose-Capillary: 172 mg/dL — ABNORMAL HIGH (ref 70–99)

## 2014-01-11 LAB — CBC
HCT: 34.1 % — ABNORMAL LOW (ref 39.0–52.0)
HEMOGLOBIN: 11.3 g/dL — AB (ref 13.0–17.0)
MCH: 28.4 pg (ref 26.0–34.0)
MCHC: 33.1 g/dL (ref 30.0–36.0)
MCV: 85.7 fL (ref 78.0–100.0)
Platelets: 187 10*3/uL (ref 150–400)
RBC: 3.98 MIL/uL — ABNORMAL LOW (ref 4.22–5.81)
RDW: 13.3 % (ref 11.5–15.5)
WBC: 10 10*3/uL (ref 4.0–10.5)

## 2014-01-11 LAB — URINE MICROSCOPIC-ADD ON

## 2014-01-11 LAB — BASIC METABOLIC PANEL
ANION GAP: 13 (ref 5–15)
BUN: 17 mg/dL (ref 6–23)
CALCIUM: 8.6 mg/dL (ref 8.4–10.5)
CO2: 22 mEq/L (ref 19–32)
CREATININE: 0.95 mg/dL (ref 0.50–1.35)
Chloride: 110 mEq/L (ref 96–112)
GFR calc Af Amer: >90 mL/min (ref >90)
GFR calc non Af Amer: 85 mL/min — ABNORMAL LOW (ref >90)
GLUCOSE: 79 mg/dL (ref 70–99)
Potassium: 3.4 mEq/L — ABNORMAL LOW (ref 3.7–5.3)
Sodium: 145 mEq/L (ref 137–147)

## 2014-01-11 LAB — URINALYSIS, ROUTINE W REFLEX MICROSCOPIC
Bilirubin Urine: NEGATIVE
GLUCOSE, UA: NEGATIVE mg/dL
Ketones, ur: NEGATIVE mg/dL
NITRITE: NEGATIVE
Protein, ur: NEGATIVE mg/dL
SPECIFIC GRAVITY, URINE: 1.035 — AB (ref 1.005–1.030)
Urobilinogen, UA: 0.2 mg/dL (ref 0.0–1.0)
pH: 5.5 (ref 5.0–8.0)

## 2014-01-11 LAB — HEMOGLOBIN A1C
Hgb A1c MFr Bld: 5.3 % (ref <5.7)
Mean Plasma Glucose: 105 mg/dL (ref <117)

## 2014-01-11 MED ORDER — CEFTRIAXONE SODIUM IN DEXTROSE 20 MG/ML IV SOLN
1.0000 g | INTRAVENOUS | Status: DC
  Administered 2014-01-11 – 2014-01-12 (×2): 1 g via INTRAVENOUS
  Filled 2014-01-11 (×3): qty 50

## 2014-01-11 MED ORDER — POLYETHYLENE GLYCOL 3350 17 G PO PACK
17.0000 g | PACK | Freq: Every day | ORAL | Status: DC
  Administered 2014-01-11 – 2014-01-12 (×2): 17 g via ORAL
  Filled 2014-01-11 (×2): qty 1

## 2014-01-11 MED ORDER — GLUCERNA SHAKE PO LIQD
237.0000 mL | Freq: Two times a day (BID) | ORAL | Status: DC
  Administered 2014-01-11: 237 mL via ORAL
  Filled 2014-01-11 (×3): qty 237

## 2014-01-11 MED ORDER — POTASSIUM CHLORIDE CRYS ER 20 MEQ PO TBCR
40.0000 meq | EXTENDED_RELEASE_TABLET | Freq: Once | ORAL | Status: AC
Start: 2014-01-11 — End: 2014-01-11
  Administered 2014-01-11: 40 meq via ORAL
  Filled 2014-01-11: qty 2

## 2014-01-11 MED ORDER — BISACODYL 10 MG RE SUPP: 10.0000 mg | Freq: Every day | RECTAL | Status: DC | PRN

## 2014-01-11 NOTE — Progress Notes (Signed)
Held patient's tap water enema. Patient had a large soft bowel movement before administering it.

## 2014-01-11 NOTE — Progress Notes (Signed)
CSW continuing to follow.   CSW received return phone call from pt cousin, Nicholas Hardin. CSW introduced self and explained role. CSW discussed with pt cousin recommendation for rehab at SNF prior to returning home if pt is alone during the day. Pt cousin expressed understanding and discussed that he has been concerned recently about pt being home alone. Pt cousin discussed that pt has been hesitant about going to an adult day care which PCP had recommended. Pt cousin discussed that pt has history of cognitive impairment and pt cousin and his wife recently had pt move in with them following pt mother death. CSW provided supportive listening as pt cousin discussed that pt fears that going to a facility is a permanent thing and pt cousin wants to ensure pt that this is short term rehab with plans to return home. [facility].  CSW completed FL[facility] and [facility].   CSW followed up with pt cousin, Daryel NovemberGary Hardin via telephone and provided SNF bed offers. Pt [facility], but [facility] reported that they would be unable to accept pt during the weekend. Per [facility], if pt discharges home then pt would have to be evaluated by home health in order to get recommendation for SNF for rehab at home. Per MD, pt does not have medical necessity to remain in hospital over the weekend. CSW contacted pt cousin, Nicholas Hardin to discuss. CSW notified pt cousin, Nicholas Hardin that [facility] during the weekend and if pt discharged home the pt would have to be evaluated by home health to ensure continued need [facility] before pt could [facility] from home Pt cousin discussed that pt family members had [facility] in the past and pt cousin wanted to discuss with pt regarding disposition planning. Pt cousin was agreeable to CSW contacting [facility] to ensure that facility can accept during the weekend if pt and pt  cousin choose for pt to go [facility].   CSW contacted [facility] and confirmed that facility could accept pt during the weekend if pt and pt family choose [facility]. Per [facility] if pt and pt family choose facility contact Darel HongJudy with admissions at [phone].   CSW discussed with pt at bedside the conversation had with pt cousin, Nicholas Hardin.   Weekend CSW to follow up with pt and pt cousin, Nicholas Hardin regarding decision for discharge plan.  Loletta SpecterSuzanna Yaiza Palazzola, MSW, LCSW Clinical Social Work [phone]

## 2014-01-11 NOTE — Progress Notes (Signed)
Patient Demographics  Nicholas Hardin, is a 68 y.o. male, DOB - [birth year], ZOX:[id number]  Admit date - 01/10/2014   Admitting Physician Catarina Hartshornavid Tat, MD  Outpatient Primary MD for the patient is Norberto SorensonSHAW,EVA, MD  LOS - 1   Chief Complaint  Patient presents with  . Emesis      Admission history of present illness/brief narrative:  68 year old male with a history of diabetes mellitus, hypertension, cognitive impairment, hyperlipidemia presents with one-day history of nausea and vomiting with associated abdominal pain. The patient had approximately 4 episodes of emesis without any blood on the day of admission. The patient denies any fevers, chills, chest pain, shortness breath, coughing, hemoptysis, hematochezia, melena.  In emergency department, BMP and CBC were unremarkable. Chest x-ray showed right lower lobe atelectasis versus infiltrate. CT of the abdomen and pelvis was negative for any acute findings except for constipation and stool impaction. There was no bowel obstruction. Hepatic enzymes and lipase were negative. EKG was sinus rhythm without ST changes. Lactic acid was 0.94. The patient received 2 L normal saline in the emergency department  A shunt was started on good bowel regimen, with good improvement of his symptoms, patient urinalysis was positive, so he was started on IV Rocephin for UTI.  Subjective:   Nicholas Paisorman [place] today has, No headache, No chest pain, No abdominal pain - No Nausea, No new weakness tingling or numbness, No Cough - SOB.   Assessment & Plan    Active Problems:   Intractable vomiting   Dehydration   Diabetes mellitus type 2, uncomplicated   Essential hypertension   Hyperlipidemia  Intractable nausea and vomiting/dehydration -Likely related to the patient's constipation -A fleets enema has been ordered in the emergency department -Start  bowel regimen including Colace and Senokot -per EDP, rectal exam was performed and revealed soft stool without impaction -Will advance diet today -IV fluids  Right lower lobe atelectasis/infiltrate -patient has no pneumonia clinically -There is no fever, leukocytosis, hypoxemia, shortness of breath or coughing -Observe the patient clinically  UTI - On IV Rocephin  Diabetes mellitus type 2 -HeA1c -NovoLog sliding scale  Hypertension -Resume atenolol and lisinopril as the patient's blood pressure is increasing. -IV fluids  Hyperlipidemia -Continue Zocor  Code Status: Full  Family Communication: Cousin at bedside  Disposition Plan: Home when stable   Procedures  None   Consults   None   Medications  Scheduled Meds: . aspirin  81 mg Oral QHS  . cefTRIAXone (ROCEPHIN)  IV  1 g Intravenous Q24H  . cyanocobalamin  500 mcg Oral Daily  . docusate sodium  100 mg Oral BID  . enoxaparin (LOVENOX) injection  40 mg Subcutaneous Q24H  . insulin aspart  0-9 Units Subcutaneous TID WC  . multivitamin with minerals  1 tablet Oral Daily  . polyethylene glycol  17 g Oral Daily  . senna  2 tablet Oral Daily  . sertraline  25 mg Oral Daily  . simvastatin  20 mg Oral QHS   Continuous Infusions: . sodium chloride 75 mL/hr at 01/11/14 0620   PRN Meds:.acetaminophen **OR** acetaminophen, albuterol, bisacodyl, ondansetron **OR** ondansetron (ZOFRAN) IV  DVT Prophylaxis  Lovenox -  Lab Results  Component Value Date   PLT 187 01/11/2014  Antibiotics    Anti-infectives    Start     Dose/Rate Route Frequency Ordered Stop   01/11/14 1600  cefTRIAXone (ROCEPHIN) 1 g in dextrose 5 % 50 mL IVPB - Premix     1 g100 mL/hr over 30 Minutes Intravenous Every 24 hours 01/11/14 0656     01/10/14 1615  cefTRIAXone (ROCEPHIN) 1 g in dextrose 5 % 50 mL IVPB     1 g100 mL/hr over 30 Minutes Intravenous  Once 01/10/14 1603 01/10/14 1745   01/10/14 1615  azithromycin (ZITHROMAX) 500 mg in  dextrose 5 % 250 mL IVPB     500 mg250 mL/hr over 60 Minutes Intravenous  Once 01/10/14 1603 01/10/14 1814          Objective:   Filed Vitals:   01/10/14 1716 01/10/14 1822 01/10/14 2106 01/11/14 0521  BP: 142/72 127/58 135/60 121/58  Pulse: 63 60 61 65  Temp: 97.9 F (36.6 C) 97.7 F (36.5 C) 98.1 F (36.7 C) 98 F (36.7 C)  TempSrc: Oral Oral Oral Oral  Resp: 9 14 16 14   Height:  5\' 5"  (1.651 m)    Weight:  87.8 kg (193 lb 9 oz)    SpO2: 98% 98% 100% 96%    Wt Readings from Last 3 Encounters:  01/10/14 87.8 kg (193 lb 9 oz)  09/21/13 92.08 kg (203 lb)  06/15/13 96.344 kg (212 lb 6.4 oz)     Intake/Output Summary (Last 24 hours) at 01/11/14 1201 Last data filed at 01/11/14 0841  Gross per 24 hour  Intake 1486.25 ml  Output    675 ml  Net 811.25 ml     Physical Exam  Awake Alert, Oriented, No new F.N deficits, Normal affect [place].AT,PERRAL Supple Neck,No JVD, No cervical lymphadenopathy appriciated.  Symmetrical Chest wall movement, Good air movement bilaterally, CTAB RRR,No Gallops,Rubs or new Murmurs, No Parasternal Heave +ve B.Sounds, Abd Soft, No tenderness, No organomegaly appriciated, No rebound - guarding or rigidity. No Cyanosis, Clubbing or edema, No new Rash or bruise     Data Review   Micro Results Recent Results (from the past 240 hour(s))  Blood culture (routine x 2)     Status: None (Preliminary result)   Collection Time: 01/10/14  4:48 PM  Result Value Ref Range Status   Specimen Description BLOOD RIGHT HAND  Final   Special Requests BOTTLES DRAWN AEROBIC ONLY  Final   Culture  Setup Time   Final    01/10/2014 23:02 Performed at [facility]    Culture   Final           BLOOD CULTURE RECEIVED NO GROWTH TO DATE CULTURE WILL BE HELD FOR 5 DAYS BEFORE ISSUING A FINAL NEGATIVE REPORT Performed at [facility]    Report Status PENDING  Incomplete  Blood culture (routine x 2)     Status: None (Preliminary result)    Collection Time: 01/10/14  4:48 PM  Result Value Ref Range Status   Specimen Description BLOOD RIGHT HAND  Final   Special Requests BOTTLES DRAWN AEROBIC ONLY  Final   Culture  Setup Time   Final    01/10/2014 23:02 Performed at [facility]    Culture   Final           BLOOD CULTURE RECEIVED NO GROWTH TO DATE CULTURE WILL BE HELD FOR 5 DAYS BEFORE ISSUING A FINAL NEGATIVE REPORT Note: Culture results may be compromised due to an inadequate volume of  blood received in culture bottles. [facility]formed at [facility]    Report Status PENDING  Incomplete    Radiology Reports Dg Chest 2 View  01/10/2014   CLINICAL DATA:  Vomiting.  EXAM: CHEST  2 VIEW  COMPARISON:  08/30/2012.  FINDINGS: Mediastinum and hilar structures are normal. Subsequent atelectasis and/or infiltrate right lung base. Cardiomegaly with normal pulmonary vascularity. No pneumothorax. No acute bony abnormality appear  IMPRESSION: 1.  Mild right lung base subsegmental atelectasis and or infiltrate.  2.  Stable cardiomegaly.  No CHF.   Electronically Signed   By: Maisie Fushomas  Register   On: 01/10/2014 13:03   Ct Abdomen Pelvis W Contrast  01/10/2014   CLINICAL DATA:  Vomiting  EXAM: CT ABDOMEN AND PELVIS WITH CONTRAST  TECHNIQUE: Multidetector CT imaging of the abdomen and pelvis was performed using the standard protocol following bolus administration of intravenous contrast.  CONTRAST:  100mL OMNIPAQUE IOHEXOL 300 MG/ML  SOLN  COMPARISON:  None.  FINDINGS: Lung bases are clear. Mild cardiac enlargement. Mild pericardial thickening.  Liver and spleen are normal. Gallbladder surgically absent. Pancreas is normal.  No renal obstruction or mass. Nonobstructing 3 x 4 mm left renal calculus. 10 mm right renal cyst. Ureters are nondilated. Urinary bladder is normal. Prostate not enlarged.  Constipation with stool throughout the colon. Rectal impaction. Negative for bowel obstruction or thickening. No free fluid.  IMPRESSION:  Constipation with rectal impaction. Negative for bowel obstruction. Otherwise no acute abnormality.   Electronically Signed   By: Marlan Palauharles  Clark M.D.   On: 01/10/2014 14:48    CBC  Recent Labs Lab 01/10/14 1310 01/11/14 0508  WBC 9.8 10.0  HGB 12.5* 11.3*  HCT 38.0* 34.1*  PLT 229 187  MCV 86.0 85.7  MCH 28.3 28.4  MCHC 32.9 33.1  RDW 13.1 13.3    Chemistries   Recent Labs Lab 01/10/14 1310 01/10/14 2018 01/11/14 0508  NA 143  --  145  K 4.2  --  3.4*  CL 104  --  110  CO2 24  --  22  GLUCOSE 149*  --  79  BUN 21  --  17  CREATININE 0.94  --  0.95  CALCIUM 9.6  --  8.6  AST 22 18  --   ALT 14 11  --   ALKPHOS 60 57  --   BILITOT 0.6 0.5  --    ------------------------------------------------------------------------------------------------------------------ estimated creatinine clearance is 77.9 mL/min (by C-G formula based on Cr of 0.95). ------------------------------------------------------------------------------------------------------------------  Recent Labs  01/11/14 0508  HGBA1C 5.3   ------------------------------------------------------------------------------------------------------------------ No results for input(s): CHOL, HDL, LDLCALC, TRIG, CHOLHDL, LDLDIRECT in the last 72 hours. ------------------------------------------------------------------------------------------------------------------ No results for input(s): TSH, T4TOTAL, T3FREE, THYROIDAB in the last 72 hours.  Invalid input(s): FREET3 ------------------------------------------------------------------------------------------------------------------ No results for input(s): VITAMINB12, FOLATE, FERRITIN, TIBC, IRON, RETICCTPCT in the last 72 hours.  Coagulation profile No results for input(s): INR, PROTIME in the last 168 hours.  No results for input(s): DDIMER in the last 72 hours.  Cardiac Enzymes No results for input(s): CKMB, TROPONINI, MYOGLOBIN in the last 168  hours.  Invalid input(s): CK ------------------------------------------------------------------------------------------------------------------ Invalid input(s): POCBNP     Time Spent in minutes   35 minutes   Annajulia Lewing M.D on 01/11/2014 at 12:01 PM  Between 7am to 7pm - Pager - [phone]  After 7pm go to [email or web address] - password [email or web address]  And look for the night coverage person covering for me after hours  [facility] Office  [phone]   **  Disclaimer: This note may have been dictated with voice recognition software. Similar sounding words can inadvertently be transcribed and this note may contain transcription errors which may not have been corrected upon publication of note.**

## 2014-01-11 NOTE — Progress Notes (Signed)
CARE MANAGEMENT NOTE 01/11/2014  Patient:  Julienne KassREID,Damante H   Account Number:  [id number]  Date Initiated:  01/11/2014  Documentation initiated by:  Lanier ClamMAHABIR,Khair Chasteen  Subjective/Objective Assessment:   68 y/o m admitted w/intractable n/v.     Action/Plan:   From home.   Anticipated DC Date:  01/12/2014   Anticipated DC Plan:  HOME/SELF CARE      DC Planning Services  CM consult      Choice offered to / List presented to:             Status of service:  In process, will continue to follow Medicare Important Message given?   (If response is "NO", the following Medicare IM given date fields will be blank) Date Medicare IM given:   Medicare IM given by:   Date Additional Medicare IM given:   Additional Medicare IM given by:    Discharge Disposition:    Per UR Regulation:  Reviewed for med. necessity/level of care/duration of stay  If discussed at Long Length of Stay Meetings, dates discussed:    Comments:  01/11/14 Lanier ClamKathy Tylisa Alcivar RN BSN NCM [phone] informed patient of cc44 verbally given over phone.Patient voiced understanding.No anticipated d/c needs.

## 2014-01-11 NOTE — Progress Notes (Signed)
Clinical Social Work Department BRIEF PSYCHOSOCIAL ASSESSMENT 01/11/2014  Patient:  Nicholas Hardin, Nicholas Hardin     Account Number:  [id number]     Admit date:  01/10/2014  Clinical Social Worker:  Nicholas Hardin  Date/Time:  01/11/2014 11:11 AM  Referred by:  Physician  Date Referred:  01/11/2014 Referred for  SNF Placement   Other Referral:   Interview type:  Patient Other interview type:    PSYCHOSOCIAL DATA Living Status:  FAMILY Admitted from facility:   Level of care:   Primary support name:  Nicholas Hardin/cousin/[phone] Primary support relationship to patient:  FAMILY Degree of support available:   strong    CURRENT CONCERNS Current Concerns  Post-Acute Placement   Other Concerns:    SOCIAL WORK ASSESSMENT / PLAN CSW received referral for New SNF. Per PT evaluation, pt lives with cousin, but is alone during the day and if pt does not progress with mobility then pt may need 24 hour care upon discharge.    CSW met with pt at bedside. CSW introduced self and explained role. Pt confirmed that he lives with his cousin, his wife, and their son. Pt stated that pt cousins help "look after him" and have been a strong support to pt following the death of pt mother whom he previously lived with. Pt discussed that he is alone during the day while pt cousins are at work. CSW discussed with pt PT recommendation that if pt does not improve with mobility then pt will likely need short term rehab at [facility]. Pt expressed understanding, but request CSW to contact pt cousin to discuss disposition planning.    CSW contacted pt cousin, Nicholas Hardin via telephone. CSW unable to reach pt cousin at this time and left voice message.    CSW to await return phone call pt cousin to further assist with discussion regarding disposition planning.    CSW to continue to follow to provide support and assist with pt discharge planning needs.   Assessment/plan status:  Psychosocial Support/Ongoing Assessment of  Needs Other assessment/ plan:   discharge planning   Information/referral to community resources:   further resources/referral pending discussion with pt cousin    PATIENT'S/FAMILY'S RESPONSE TO PLAN OF CARE: Pt alert and oriented x 4. Pt pleasant and actively engaged in assessment. Pt wanted pt cousin to be involved in conversation as pt cousin assist pt. CSW awaiting return phone call from pt cousin, Nicholas Hardin.    Nicholas Hardin, MSW, [place] [facility] [phone]

## 2014-01-11 NOTE — Progress Notes (Signed)
Clinical Social Work Department CLINICAL SOCIAL WORK PLACEMENT NOTE 01/11/2014  Patient:  Nicholas Hardin,Nicholas Hardin  Account Number:  [id number] Admit date:  01/10/2014  Clinical Social Worker:  Garlan FairSUZANNA KIDD, LCSWA  Date/time:  01/11/2014 12:00 N  Clinical Social Work is seeking post-discharge placement for this patient at the following level of care:   SKILLED NURSING   (*CSW will update this form in Epic as items are completed)   01/11/2014  Patient/family provided with [facility] Clinical [facility] Work's list of facilities offering this level of care within the geographic area requested by the patient (or if unable, by the patient's family).  01/11/2014  Patient/family informed of their freedom to choose among providers that offer the needed level of care, that participate in Medicare, Medicaid or managed care program needed by the patient, have an available bed and are willing to accept the patient.  01/11/2014  Patient/family informed of [facility]' ownership interest in [facility], as well as of the fact that they are under no obligation to receive care at this facility.  PASARR submitted to EDS on 01/11/2014 PASARR number received on 01/11/2014  FL2 transmitted to all facilities in geographic area requested by pt/family on  01/11/2014 FL2 transmitted to all facilities within larger geographic area on   Patient informed that his/her managed care company has contracts with or will negotiate with  certain facilities, including the following:     Patient/family informed of bed offers received:  01/11/2014 Patient chooses bed at  Physician recommends and patient chooses bed at    Patient to be transferred to  on   Patient to be transferred to facility by  Patient and family notified of transfer on  Name of family member notified:    The following physician request were entered in Epic:   Additional Comments:   Loletta SpecterSuzanna Kidd, MSW, LCSW Clinical Social  Work [phone]

## 2014-01-11 NOTE — Evaluation (Addendum)
Physical Therapy Evaluation Patient Details Name: Nicholas Hardin H Stadler MRN: [id number] DOB: 04-Jul-1945 Today's Date: 01/11/2014   History of Present Illness  68 year old male with a history of diabetes mellitus, hypertension, cognitive impairment, hyperlipidemia presents with one-day history of nausea and vomiting with associated abdominal pain. He hasn't had a bowel movement in 2 weeks.    Clinical Impression  *Pt admitted with above diagnosis. Pt currently with functional limitations due to the deficits listed below (see PT Problem List).** Pt will benefit from skilled PT to increase their independence and safety with mobility to allow discharge to the venue listed below.    At baseline pt ambulates independently with a cane. With trial of walking with cane today he was quite unsteady, requiring assist to prevent falling. Pt then walked with a rolling walker and was much steadier, though still had LOB during turns and required assist to prevent fall. He lives with his cousin who works days. If he's still unsteady at time of DC he'll need 24* supervision.  Noted bright red, warm skin on B shins, pt denied pain and stated the redness is not new. RN notified.  **    Follow Up Recommendations Supervision/Assistance - 24 hour;SNF (if he is still unsteady at time of DC he will need 24* assist, pt lives with cousin who works days)    Engineer, agriculturalquipment Recommendations  Rolling walker with 5" wheels    Recommendations for Other Services       Precautions / Restrictions Precautions Precautions: Fall Restrictions Weight Bearing Restrictions: No      Mobility  Bed Mobility Overal bed mobility: Modified Independent             General bed mobility comments: HOB up  Transfers Overall transfer level: Needs assistance Equipment used: Straight cane Transfers: Sit to/from Stand Sit to Stand: Min assist         General transfer comment: min A for balance, used momentum to rise, required 2  attempts  Ambulation/Gait Ambulation/Gait assistance: ArchitectMin assist Ambulation Distance (Feet): 150 Feet Assistive device: Rolling walker (2 wheeled) Gait Pattern/deviations: Step-through pattern;Trunk flexed;Decreased stride length   Gait velocity interpretation: Below normal speed for age/gender General Gait Details: [facility] but was unsteaady, requiring min A for balance, switched to RW and balance improved significantly though he still had LOB during a turn requiring min A  Stairs            Wheelchair Mobility    Modified Rankin (Stroke Patients Only)       Balance Overall balance assessment: Needs assistance   Sitting balance-Leahy Scale: Good       Standing balance-Leahy Scale: Poor                               Pertinent Vitals/Pain Pain Assessment: No/denies pain    Home Living Family/patient expects to be discharged to:: Private residence Living Arrangements: Other relatives (cousin) Available Help at Discharge: Family;Available PRN/intermittently Type of Home: House Home Access: Ramped entrance     Home Layout: One level Home Equipment: Cane - single point Additional Comments: cousin works during the day    Prior Function Level of Independence: Independent with assistive device(s)         Comments: independent with SPC, independent bathing/dressing     Hand Dominance        Extremity/Trunk Assessment   Upper Extremity Assessment: Overall WFL for tasks assessed  Lower Extremity Assessment: Overall WFL for tasks assessed (noted bright red, warm to touch skin B shins, RN notified, pt stated this is long standing and is not painful)      Cervical / Trunk Assessment: Normal  Communication   Communication: HOH  Cognition Arousal/Alertness: Awake/alert Behavior During Therapy: WFL for tasks assessed/performed Overall Cognitive Status: No family/caregiver present to determine baseline cognitive  functioning (h/o cognitive impairment noted in chart, pt able to follow directions and priovide history during PT eval)                      General Comments      Exercises        Assessment/Plan    PT Assessment Patient needs continued PT services  PT Diagnosis Difficulty walking;Generalized weakness   PT Problem List Decreased activity tolerance;Decreased balance;Decreased mobility  PT Treatment Interventions DME instruction;Gait training;Functional mobility training;Therapeutic activities;Patient/family education;Balance training   PT Goals (Current goals can be found in the Care Plan section) Acute Rehab PT Goals Patient Stated Goal: return to walking outside and watching tv PT Goal Formulation: With patient Time For Goal Achievement: 01/25/14 Potential to Achieve Goals: Good    Frequency Min 3X/week   Barriers to discharge Decreased caregiver support      Co-evaluation               End of Session Equipment Utilized During Treatment: Gait belt Activity Tolerance: Patient tolerated treatment well Patient left: in chair;with call bell/phone within reach;with chair alarm set Nurse Communication: Mobility status         Time: 2956-21300915-0938 PT Time Calculation (min) (ACUTE ONLY): 23 min   Charges:   PT Evaluation $Initial PT Evaluation Tier I: 1 Procedure PT Treatments $Gait Training: 8-22 mins   PT G Codes:     PT G-Codes **NOT FOR INPATIENT CLASS** Functional Assessment Tool Used clinical judgement clinical judgement at 0956 on 01/11/14 by Alvester MorinJennifer K Wynston Romey, PT Functional Limitation Mobility: Walking and moving around Mobility: Walking and moving around at 0956 on 01/11/14 by Alvester MorinJennifer K Latika Kronick, PT Mobility: Walking and Moving Around Current Status [phone]([id number]) At least 20 percent but less than 40 percent impaired, limited or restricted CJ at 0956 on 01/11/14 by Alvester MorinJennifer K Geselle Cardosa, PT Mobility: Walking and Moving Around Goal Status [phone]([id number]) At  least 1 percent but less than 20 percent impaired, limited or restricted CI at 0956 on 01/11/14 by Alvester MorinJennifer K Jai Steil, PT Mobility: Walking and Moving Around Discharge Status [phone]([id number])       Tamala SerUhlenberg, Ricardo Schubach Kistler 01/11/2014, 9:58 AM  [phone]

## 2014-01-11 NOTE — Progress Notes (Signed)
INITIAL NUTRITION ASSESSMENT  DOCUMENTATION CODES Per approved criteria  -Not Applicable   INTERVENTION: - Once diet advanced, recommend Glucerna Shake po BID, each supplement provides 220 kcal and 10 grams of protein  NUTRITION DIAGNOSIS: Inadequate oral intake related to nausea, vomiting and abdominal pain as evidenced by wt loss.   Goal: Pt to meet >/= 90% of their estimated nutrition needs   Monitor:  Weight trend, po intake, acceptance of supplements  Reason for Assessment: MST  68 y.o. male  Admitting Dx: <principal problem not specified>  ASSESSMENT: 68 year old male with a history of diabetes mellitus, hypertension, cognitive impairment, hyperlipidemia presents with one-day history of nausea and vomiting with associated abdominal pain. The patient had approximately 4 episodes of emesis without any blood on the day of admission.  - Per chart history, pt has had a 10 lb wt loss in the past 4 months and a 36 lb wt loss within the past year. Pt's current diet is clear liquids and he is tolerating water. Pt says that he does not feel hungry at this time.  - Pt with no signs of fat and muscle wasting.   Height: Ht Readings from Last 1 Encounters:  01/10/14 5\' 5"  (1.651 m)    Weight: Wt Readings from Last 1 Encounters:  01/10/14 193 lb 9 oz (87.8 kg)    Ideal Body Weight: 61.5 kg  % Ideal Body Weight: 143%  Wt Readings from Last 10 Encounters:  01/10/14 193 lb 9 oz (87.8 kg)  09/21/13 203 lb (92.08 kg)  06/15/13 212 lb 6.4 oz (96.344 kg)  02/16/13 227 lb (102.967 kg)  01/31/13 229 lb (103.874 kg)  12/21/12 231 lb 9.6 oz (105.053 kg)  12/18/12 231 lb (104.781 kg)  12/15/12 231 lb (104.781 kg)  11/17/12 244 lb (110.678 kg)  10/10/12 239 lb 9.6 oz (108.682 kg)   BMI:  Body mass index is 32.21 kg/(m^2).  Estimated Nutritional Needs: Kcal: 1850-2000 Protein: 120-130 g Fluid: 1.9-2.0  Skin: Intact  Diet Order: Diet clear liquid  EDUCATION  NEEDS: -Education needs addressed   Intake/Output Summary (Last 24 hours) at 01/11/14 1259 Last data filed at 01/11/14 0841  Gross per 24 hour  Intake 1486.25 ml  Output    675 ml  Net 811.25 ml    Last BM: 12/11   Labs:   Recent Labs Lab 01/10/14 1310 01/11/14 0508  NA 143 145  K 4.2 3.4*  CL 104 110  CO2 24 22  BUN 21 17  CREATININE 0.94 0.95  CALCIUM 9.6 8.6  GLUCOSE 149* 79    CBG (last 3)   Recent Labs  01/10/14 2105 01/11/14 0835 01/11/14 1157  GLUCAP 94 84 221*    Scheduled Meds: . aspirin  81 mg Oral QHS  . cefTRIAXone (ROCEPHIN)  IV  1 g Intravenous Q24H  . cyanocobalamin  500 mcg Oral Daily  . docusate sodium  100 mg Oral BID  . enoxaparin (LOVENOX) injection  40 mg Subcutaneous Q24H  . insulin aspart  0-9 Units Subcutaneous TID WC  . multivitamin with minerals  1 tablet Oral Daily  . polyethylene glycol  17 g Oral Daily  . senna  2 tablet Oral Daily  . sertraline  25 mg Oral Daily  . simvastatin  20 mg Oral QHS    Continuous Infusions: . sodium chloride 75 mL/hr at 01/11/14 16100620    Past Medical History  Diagnosis Date  . Diabetes mellitus without complication   . Hypertension   .  Cognitive deficits   . Hyperlipidemia     Past Surgical History  Procedure Laterality Date  . Dental surgery      had all teeth removed  . Cholecystectomy N/A 09/06/2012    Procedure: LAPAROSCOPIC CHOLECYSTECTOMY WITH INTRAOPERATIVE CHOLANGIOGRAM;  Surgeon: Robyne AskewPaul S Toth III, MD;  [facility];  Service: General;  Laterality: N/A;  Merri Ray. Gall blader N/A     09/06/2012    Emmaline KluverHaley Laurisa Sahakian MS, RD, LDN

## 2014-01-12 LAB — CBC
HCT: 33.7 % — ABNORMAL LOW (ref 39.0–52.0)
Hemoglobin: 11.1 g/dL — ABNORMAL LOW (ref 13.0–17.0)
MCH: 28.5 pg (ref 26.0–34.0)
MCHC: 32.9 g/dL (ref 30.0–36.0)
MCV: 86.6 fL (ref 78.0–100.0)
PLATELETS: 185 10*3/uL (ref 150–400)
RBC: 3.89 MIL/uL — ABNORMAL LOW (ref 4.22–5.81)
RDW: 13.4 % (ref 11.5–15.5)
WBC: 7.5 10*3/uL (ref 4.0–10.5)

## 2014-01-12 LAB — BASIC METABOLIC PANEL
ANION GAP: 11 (ref 5–15)
BUN: 9 mg/dL (ref 6–23)
CO2: 22 mEq/L (ref 19–32)
Calcium: 8.2 mg/dL — ABNORMAL LOW (ref 8.4–10.5)
Chloride: 114 mEq/L — ABNORMAL HIGH (ref 96–112)
Creatinine, Ser: 0.88 mg/dL (ref 0.50–1.35)
GFR calc non Af Amer: 88 mL/min — ABNORMAL LOW (ref >90)
Glucose, Bld: 86 mg/dL (ref 70–99)
POTASSIUM: 3.4 meq/L — AB (ref 3.7–5.3)
Sodium: 147 mEq/L (ref 137–147)

## 2014-01-12 LAB — GLUCOSE, CAPILLARY
GLUCOSE-CAPILLARY: 115 mg/dL — AB (ref 70–99)
Glucose-Capillary: 135 mg/dL — ABNORMAL HIGH (ref 70–99)

## 2014-01-12 LAB — URINE CULTURE
CULTURE: NO GROWTH
Colony Count: NO GROWTH

## 2014-01-12 MED ORDER — DSS 100 MG PO CAPS: 100.0000 mg | ORAL_CAPSULE | Freq: Two times a day (BID) | ORAL | Status: DC

## 2014-01-12 MED ORDER — SENNA 8.6 MG PO TABS: 2.0000 | ORAL_TABLET | Freq: Every day | ORAL | Status: DC

## 2014-01-12 MED ORDER — POLYETHYLENE GLYCOL 3350 17 G PO PACK: 17.0000 g | PACK | Freq: Every day | ORAL | Status: DC | PRN

## 2014-01-12 MED ORDER — BISACODYL 10 MG RE SUPP: 10.0000 mg | Freq: Every day | RECTAL | Status: DC | PRN

## 2014-01-12 NOTE — Progress Notes (Signed)
CARE MANAGEMENT NOTE 01/12/2014  Patient:  Nicholas Hardin,Nicholas Hardin   Account Number:  [id number]  Date Initiated:  01/11/2014  Documentation initiated by:  Lanier ClamMAHABIR,KATHY  Subjective/Objective Assessment:   68 y/o m admitted w/intractable n/v.     Action/Plan:   From home.   Anticipated DC Date:  01/12/2014   Anticipated DC Plan:  HOME/SELF CARE  In-house referral  NA      DC Planning Services  CM consult      [facility]  HOME HEALTH   Choice offered to / List presented to:  C-1 Patient        HH arranged  HH-1 RN  HH-2 PT      HH agency  [facility]   Status of service:  Completed, signed off Medicare Important Message given?  NA - LOS <3 / Initial given by admissions (If response is "NO", the following Medicare IM given date fields will be blank) Date Medicare IM given:   Medicare IM given by:   Date Additional Medicare IM given:   Additional Medicare IM given by:    Discharge Disposition:  HOME W HOME HEALTH SERVICES  Per UR Regulation:  Reviewed for med. necessity/level of care/duration of stay  If discussed at Long Length of Stay Meetings, dates discussed:    Comments:   01/12/14 12:21pm  Nicholas Hardin Hardin. Gardiner Barefootooper RN, BSN, [place] [phone]( [phone]).  CM received referral for HHPT and HHRN. Spoke with pt., pt's cousin Jillyn Hidden(Gary), and Gary's son regarding d/c needs. Per pt's cousins, pt. will have 24 hour care post d/c for approximately 1 -2 weeks.  Patient has follow up appt. with PCP in approximately 1 week and family is planning to discuss future homecare needs with PCP.  Per Jillyn HiddenGary, they have spoken with [facility] and received list of area SNF if needed in the future.  Jillyn HiddenGary states they are currently researching private duty sitter (neighbor may be able to assist) for 2 -3hrs per day if needed in the future.  Pt. and family are wanting [facility] and hoping to get pt. back to his baseline. [facility] and [facility].  Pt. has  cane, walker, ramp and currently in the process of obtaining a walk in tub.  CM advised of no other CM needs and pt & family voice understanding of above information. Referral for HHPT & HHRN [facility] [phone]).   01/11/14 Lanier ClamKathy Mahabir RN BSN NCM [phone] informed patient of cc44 verbally given over phone.Patient voiced understanding.No anticipated d/c needs.

## 2014-01-12 NOTE — Progress Notes (Signed)
Utilization Review completed.  

## 2014-01-12 NOTE — Discharge Summary (Signed)
Nicholas Hardin, 68 y.o., DOB August 16, 1945, MRN [id number]. Admission date: 01/10/2014 Discharge Date 01/12/2014 Primary MD Norberto Sorenson, MD Admitting Physician Catarina Hartshorn, MD  Admission Diagnosis  Nausea [R11.0] Vomiting [R11.10]  Discharge Diagnosis   Active Problems:   Intractable vomiting   Dehydration   Diabetes mellitus type 2, uncomplicated   Essential hypertension   Hyperlipidemia   Intractable nausea and vomiting      Past Medical History  Diagnosis Date  . Diabetes mellitus without complication   . Hypertension   . Cognitive deficits   . Hyperlipidemia     Past Surgical History  Procedure Laterality Date  . Dental surgery      had all teeth removed  . Cholecystectomy N/A 09/06/2012    Procedure: LAPAROSCOPIC CHOLECYSTECTOMY WITH INTRAOPERATIVE CHOLANGIOGRAM;  Surgeon: Robyne Askew, MD;  Location: [facility] OR;  Service: General;  Laterality: N/A;  . Merri Ray blader N/A     09/06/2012   Admission history of present illness/brief narrative:  68 year old male with a history of diabetes mellitus, hypertension, cognitive impairment, hyperlipidemia presents with one-day history of nausea and vomiting with associated abdominal pain. The patient had approximately 4 episodes of emesis without any blood on the day of admission. The patient denies any fevers, chills, chest pain, shortness breath, coughing, hemoptysis, hematochezia, melena.  In emergency department, BMP and CBC were unremarkable. Chest x-ray showed right lower lobe atelectasis versus infiltrate. CT of the abdomen and pelvis was negative for any acute findings except for constipation and stool impaction. There was no bowel obstruction. Hepatic enzymes and lipase were negative. EKG was sinus rhythm without ST changes. Lactic acid was 0.94. The patient received 2 L normal saline in the emergency department  Patient was started on good bowel regimen, with good improvement of his symptoms, patient urinalysis was positive, so he was  started on IV Rocephin for UTI.  Hospital Course See H&P, Labs, Consult and Test reports for all details in brief, patient was admitted for **  Active Problems:   Intractable vomiting   Dehydration   Diabetes mellitus type 2, uncomplicated   Essential hypertension   Hyperlipidemia   Intractable nausea and vomiting  Intractable nausea and vomiting/dehydration -related to the patient's constipation -A fleets enema has been ordered in the emergency department -Started bowel regimen including Colace and Senokot during hospitalization as well to continue on discharge. -per EDP, rectal exam was performed and revealed soft stool without impaction  Right lower lobe atelectasis/infiltrate -patient has no pneumonia clinically -There is no fever, leukocytosis, hypoxemia, shortness of breath or coughing   UTI - Patient had a positive urinalysis on admission, treated total of 3 days of IV Rocephin, urine culture has no growth to date.  Diabetes mellitus type 2 -CBGs were controlled with insulin sliding scale, will be discharged home on his home medication Glimepiride.   Hypertension -Resume atenolol and lisinopril on discharge  Hyperlipidemia -Continue Zocor     Significant Tests:  See full reports for all details    Dg Chest 2 View  01/10/2014   CLINICAL DATA:  Vomiting.  EXAM: CHEST  2 VIEW  COMPARISON:  08/30/2012.  FINDINGS: Mediastinum and hilar structures are normal. Subsequent atelectasis and/or infiltrate right lung base. Cardiomegaly with normal pulmonary vascularity. No pneumothorax. No acute bony abnormality appear  IMPRESSION: 1.  Mild right lung base subsegmental atelectasis and or infiltrate.  2.  Stable cardiomegaly.  No CHF.   Electronically Signed   By: Maisie Fus  Register   On: 01/10/2014 13:03  Ct Abdomen Pelvis W Contrast  01/10/2014   CLINICAL DATA:  Vomiting  EXAM: CT ABDOMEN AND PELVIS WITH CONTRAST  TECHNIQUE: Multidetector CT imaging of the abdomen and pelvis  was performed using the standard protocol following bolus administration of intravenous contrast.  CONTRAST:  100mL OMNIPAQUE IOHEXOL 300 MG/ML  SOLN  COMPARISON:  None.  FINDINGS: Lung bases are clear. Mild cardiac enlargement. Mild pericardial thickening.  Liver and spleen are normal. Gallbladder surgically absent. Pancreas is normal.  No renal obstruction or mass. Nonobstructing 3 x 4 mm left renal calculus. 10 mm right renal cyst. Ureters are nondilated. Urinary bladder is normal. Prostate not enlarged.  Constipation with stool throughout the colon. Rectal impaction. Negative for bowel obstruction or thickening. No free fluid.  IMPRESSION: Constipation with rectal impaction. Negative for bowel obstruction. Otherwise no acute abnormality.   Electronically Signed   By: Marlan Palauharles  Clark M.D.   On: 01/10/2014 14:48     Today   Subjective:   Nicholas Hardin today has no headache,no chest abdominal pain,no new weakness tingling or numbness, feels much better today, rating his diet very well.  Objective:   Blood pressure 119/60, pulse 60, temperature 98.1 F (36.7 C), temperature source Oral, resp. rate 14, height 5\' 5"  (1.651 m), weight 87.8 kg (193 lb 9 oz), SpO2 97 %.  Intake/Output Summary (Last 24 hours) at 01/12/14 1211 Last data filed at 01/12/14 0650  Gross per 24 hour  Intake    600 ml  Output   1250 ml  Net   -650 ml    Exam Awake Alert, Oriented , No new F.N deficits, Normal affect [place].AT,PERRAL Supple Neck,No JVD, No cervical lymphadenopathy appriciated.  Symmetrical Chest wall movement, Good air movement bilaterally, CTAB RRR,No Gallops,Rubs or new Murmurs, No Parasternal Heave +ve B.Sounds, Abd Soft, Non tender, No organomegaly appriciated, No rebound -guarding or rigidity. No Cyanosis, Clubbing or edema, No new Rash or bruise  Data Review   Cultures -  Recent Results (from the past 240 hour(s))  Blood culture (routine x 2)     Status: None (Preliminary result)   Collection  Time: 01/10/14  4:48 PM  Result Value Ref Range Status   Specimen Description BLOOD RIGHT HAND  Final   Special Requests BOTTLES DRAWN AEROBIC ONLY 2ML  Final   Culture  Setup Time   Final    01/10/2014 23:02 Performed at [facility]    Culture   Final           BLOOD CULTURE RECEIVED NO GROWTH TO DATE CULTURE WILL BE HELD FOR 5 DAYS BEFORE ISSUING A FINAL NEGATIVE REPORT Performed at [facility]    Report Status PENDING  Incomplete  Blood culture (routine x 2)     Status: None (Preliminary result)   Collection Time: 01/10/14  4:48 PM  Result Value Ref Range Status   Specimen Description BLOOD RIGHT HAND  Final   Special Requests BOTTLES DRAWN AEROBIC ONLY 1ML  Final   Culture  Setup Time   Final    01/10/2014 23:02 Performed at [facility]    Culture   Final           BLOOD CULTURE RECEIVED NO GROWTH TO DATE CULTURE WILL BE HELD FOR 5 DAYS BEFORE ISSUING A FINAL NEGATIVE REPORT Note: Culture results may be compromised due to an inadequate volume of blood received in culture bottles. Performed at [facility]    Report Status PENDING  Incomplete  Urine culture  Status: None   Collection Time: 01/10/14 11:49 PM  Result Value Ref Range Status   Specimen Description URINE, CLEAN CATCH  Final   Special Requests NONE  Final   Culture  Setup Time   Final    01/11/2014 05:01 Performed at [facility]    Colony Count NO GROWTH Performed at [facility]   Final   Culture NO GROWTH Performed at [facility]   Final   Report Status 01/12/2014 FINAL  Final    CBC w Diff: Lab Results  Component Value Date   WBC 7.5 01/12/2014   HGB 11.1* 01/12/2014   HCT 33.7* 01/12/2014   PLT 185 01/12/2014   LYMPHOPCT 22 09/21/2013   BANDSPCT 0 04/10/2008   MONOPCT 7 09/21/2013   EOSPCT 3 09/21/2013   BASOPCT 0 09/21/2013   CMP: Lab Results  Component Value Date   NA 147 01/12/2014   K 3.4* 01/12/2014   CL 114* 01/12/2014    CO2 22 01/12/2014   BUN 9 01/12/2014   CREATININE 0.88 01/12/2014   CREATININE 1.17 09/21/2013   PROT 6.7 01/10/2014   ALBUMIN 3.7 01/10/2014   BILITOT 0.5 01/10/2014   ALKPHOS 57 01/10/2014   AST 18 01/10/2014   ALT 11 01/10/2014  .  Micro Results Recent Results (from the past 240 hour(s))  Blood culture (routine x 2)     Status: None (Preliminary result)   Collection Time: 01/10/14  4:48 PM  Result Value Ref Range Status   Specimen Description BLOOD RIGHT HAND  Final   Special Requests BOTTLES DRAWN AEROBIC ONLY 2ML  Final   Culture  Setup Time   Final    01/10/2014 23:02 Performed at [facility]    Culture   Final           BLOOD CULTURE RECEIVED NO GROWTH TO DATE CULTURE WILL BE HELD FOR 5 DAYS BEFORE ISSUING A FINAL NEGATIVE REPORT Performed at [facility]    Report Status PENDING  Incomplete  Blood culture (routine x 2)     Status: None (Preliminary result)   Collection Time: 01/10/14  4:48 PM  Result Value Ref Range Status   Specimen Description BLOOD RIGHT HAND  Final   Special Requests BOTTLES DRAWN AEROBIC ONLY 1ML  Final   Culture  Setup Time   Final    01/10/2014 23:02 Performed at [facility]    Culture   Final           BLOOD CULTURE RECEIVED NO GROWTH TO DATE CULTURE WILL BE HELD FOR 5 DAYS BEFORE ISSUING A FINAL NEGATIVE REPORT Note: Culture results may be compromised due to an inadequate volume of blood received in culture bottles. Performed at [facility]    Report Status PENDING  Incomplete  Urine culture     Status: None   Collection Time: 01/10/14 11:49 PM  Result Value Ref Range Status   Specimen Description URINE, CLEAN CATCH  Final   Special Requests NONE  Final   Culture  Setup Time   Final    01/11/2014 05:01 Performed at [facility]    Colony Count NO GROWTH Performed at [facility]   Final   Culture NO GROWTH Performed at [facility]   Final   Report Status  01/12/2014 FINAL  Final     Discharge Instructions    Please keep your appointment with PCP this coming Friday.  Follow-up Information    Follow  up with Norberto Sorenson, MD. Call in 5 days.   Specialty:  Family Medicine   Contact information:   [address] [place] [address] [phone]       Discharge Medications     Medication List    STOP taking these medications        TENDERWRAP UNNA BOOT Misc     zoster vaccine live (PF) 19400 UNT/0.65ML injection  Commonly known as:  ZOSTAVAX      TAKE these medications        albuterol 108 (90 BASE) MCG/ACT inhaler  Commonly known as:  PROVENTIL HFA;VENTOLIN HFA  Inhale 2 puffs into the lungs every 6 (six) hours as needed for wheezing or shortness of breath.     ammonium lactate 12 % cream  Commonly known as:  AMLACTIN  APPLY TOPICALLY TO AFFECTED TWICE DAILY     aspirin 81 MG chewable tablet  Chew 81 mg by mouth at bedtime.     atenolol 25 MG tablet  Commonly known as:  TENORMIN  TAKE 1 TABLET BY MOUTH EVERY DAY     B-12 500 MCG Tabs  Take 500 mcg by mouth daily.     bisacodyl 10 MG suppository  Commonly known as:  DULCOLAX  Place 1 suppository (10 mg total) rectally daily as needed for moderate constipation.     DSS 100 MG Caps  Take 100 mg by mouth 2 (two) times daily.     glimepiride 4 MG tablet  Commonly known as:  AMARYL  Take 1 tablet (4 mg total) by mouth daily with breakfast.     lisinopril 10 MG tablet  Commonly known as:  PRINIVIL,ZESTRIL  Take 1 tablet (10 mg total) by mouth daily.     multivitamin tablet  Take 1 tablet by mouth daily.     mupirocin ointment 2 %  Commonly known as:  BACTROBAN  Place 1 application into the nose 2 (two) times daily.     OVER THE COUNTER MEDICATION  Take 1 tablet by mouth at bedtime. "Leg cramps pill"     polyethylene glycol packet  Commonly known as:  MIRALAX / GLYCOLAX  Take 17 g by mouth daily as needed for moderate constipation.     senna 8.6 MG Tabs  tablet  Commonly known as:  SENOKOT  Take 2 tablets (17.2 mg total) by mouth daily.     sertraline 25 MG tablet  Commonly known as:  ZOLOFT  TAKE 1 TABLET BY MOUTH EVERY DAY     simvastatin 20 MG tablet  Commonly known as:  ZOCOR  Take 1 tablet (20 mg total) by mouth at bedtime.     XEROFORM PETROLAT GAUZE 5"X9" Misc  Apply 1 application topically every other day.         Total Time in preparing paper work, data evaluation and todays exam - 35 minutes  Saburo Luger M.D on 01/12/2014 at 12:11 PM  [facility] Office  [phone]

## 2014-01-12 NOTE — Discharge Instructions (Signed)
Follow with Primary MD Norberto SorensonSHAW,EVA, MD in 5 days , keep your appointment for this coming Friday.  Get CBC, CMP, 2 view Chest X ray checked  by Primary MD next visit.   Please monitor patient daily for bowel movements, use laxative as needed, will keep patient on daily Colace and senna, and if no bowel movement in 1 day can start patient on Miralax .  Activity: As tolerated with Full fall precautions use walker/cane & assistance as needed   Disposition Home with home care   Diet:  Soft/ Heart Healthy  , with feeding assistance and aspiration precautions as needed.  For Heart failure patients - Check your Weight same time everyday, if you gain over 2 pounds, or you develop in leg swelling, experience more shortness of breath or chest pain, call your Primary MD immediately. Follow Cardiac Low Salt Diet and 1.8 lit/day fluid restriction.   On your next visit with your primary care physician please Get Medicines reviewed and adjusted.   Please request your Prim.MD to go over all Hospital Tests and Procedure/Radiological results at the follow up, please get all Hospital records sent to your Prim MD by signing hospital release before you go home.   If you experience worsening of your admission symptoms, develop shortness of breath, life threatening emergency, suicidal or homicidal thoughts you must seek medical attention immediately by calling 911 or calling your MD immediately  if symptoms less severe.  You Must read complete instructions/literature along with all the possible adverse reactions/side effects for all the Medicines you take and that have been prescribed to you. Take any new Medicines after you have completely understood and accpet all the possible adverse reactions/side effects.   Do not drive, operating heavy machinery, perform activities at heights, swimming or participation in water activities or provide baby sitting services if your were admitted for syncope or siezures until you  have seen by Primary MD or a Neurologist and advised to do so again.  Do not drive when taking Pain medications.    Do not take more than prescribed Pain, Sleep and Anxiety Medications  Special Instructions: If you have smoked or chewed Tobacco  in the last 2 yrs please stop smoking, stop any regular Alcohol  and or any Recreational drug use.  Wear Seat belts while driving.   Please note  You were cared for by a hospitalist during your hospital stay. If you have any questions about your discharge medications or the care you received while you were in the hospital after you are discharged, you can call the unit and asked to speak with the hospitalist on call if the hospitalist that took care of you is not available. Once you are discharged, your primary care physician will handle any further medical issues. Please note that NO REFILLS for any discharge medications will be authorized once you are discharged, as it is imperative that you return to your primary care physician (or establish a relationship with a primary care physician if you do not have one) for your aftercare needs so that they can reassess your need for medications and monitor your lab values.

## 2014-01-13 NOTE — Clinical Social Work Note (Signed)
  CSW reviewed PT notes evaluation which [facility]  CSW referred to case manager  No further CSW needs  CSW signing off  .Elray Bubaegina Angelina Venard, LCSW [facility] Clinical Social Worker - Weekend Coverage cell #: [phone]

## 2014-01-16 LAB — CULTURE, BLOOD (ROUTINE X 2)
CULTURE: NO GROWTH
Culture: NO GROWTH

## 2014-01-18 ENCOUNTER — Encounter: Payer: Self-pay | Admitting: Family Medicine

## 2014-01-18 ENCOUNTER — Ambulatory Visit (INDEPENDENT_AMBULATORY_CARE_PROVIDER_SITE_OTHER): Payer: Medicare Other | Admitting: Family Medicine

## 2014-01-18 VITALS — BP 128/64 | HR 60 | Temp 97.6°F | Resp 16 | Ht 64.0 in | Wt 194.6 lb

## 2014-01-18 DIAGNOSIS — K5900 Constipation, unspecified: Secondary | ICD-10-CM

## 2014-01-18 DIAGNOSIS — Z23 Encounter for immunization: Secondary | ICD-10-CM

## 2014-01-18 DIAGNOSIS — Z1211 Encounter for screening for malignant neoplasm of colon: Secondary | ICD-10-CM

## 2014-01-18 DIAGNOSIS — G990 Autonomic neuropathy in diseases classified elsewhere: Secondary | ICD-10-CM

## 2014-01-18 DIAGNOSIS — Z1212 Encounter for screening for malignant neoplasm of rectum: Secondary | ICD-10-CM

## 2014-01-18 DIAGNOSIS — E1143 Type 2 diabetes mellitus with diabetic autonomic (poly)neuropathy: Secondary | ICD-10-CM

## 2014-01-18 DIAGNOSIS — E1142 Type 2 diabetes mellitus with diabetic polyneuropathy: Secondary | ICD-10-CM

## 2014-01-18 NOTE — Progress Notes (Signed)
MRN: [id number] DOB: March 10, 1945  Subjective:   Nicholas Hardin is a 68 y.o. male presenting for follow up of ED visit for constipaton 8 days ago.  He is here today with his cousin.  His cousin reports that he was vomiting, and stumbling.  Found constipation and placed on laxative and a stool softener daily.  Cardiovascular/neurological workup was placed to insure this was not an occurrence. Cousin states that patient is doing much better and has resumed progressed to a normal diet at this time.  Patient reports to cousin that he has 1-2 soft stools per day since discharge.  They have changed his intake to less complex sugars in the morning and are trying to give hime more variety, including vegetables.  Cereal was changed to bran.  Patient has always practiced good hydration, and walks 2-3 miles per day.  He denies any blood in stool or any episodes of constipation.  Cousin states that patient was counseled and readily states that he is havinga bowel movement at home.   Cousin is requesting for attachable handle bars placed on bed, as patient continues to roll off the bed at night.  He would like a discounted rate or coverage for them.  They have been estimated to be around $200.00.   Nicholas Hardin has a current medication list which includes the following prescription(s): albuterol, aspirin, atenolol, bisacodyl, xeroform petrolat gauze 5"x9", b-12, dss, glimepiride, lisinopril, multivitamin, OVER THE COUNTER MEDICATION, polyethylene glycol, senna, sertraline, simvastatin, ammonium lactate, and mupirocin ointment.  She is allergic to sulfa antibiotics.  Nicholas Hardin  has a past medical history of Diabetes mellitus without complication; Hypertension; Cognitive deficits; and Hyperlipidemia. Also  has past surgical history that includes Dental surgery; Cholecystectomy (N/A, 09/06/2012); and gall blader (N/A).  ROS As in subjective.  Objective:   Vitals: BP 128/64 mmHg  Pulse 60  Temp(Src) 97.6 F (36.4 C)  (Oral)  Resp 16  Ht 5\' 4"  (1.626 m)  Wt 194 lb 9.6 oz (88.27 kg)  BMI 33.39 kg/m2  SpO2 97%  Physical Exam  Constitutional: He is oriented to person, place, and time and well-developed, well-nourished, and in no distress. No distress.  HENT:  Head: Normocephalic and atraumatic.  Eyes: Conjunctivae are normal. Pupils are equal, round, and reactive to light.  Cardiovascular: Normal rate, regular rhythm, normal heart sounds and intact distal pulses.  Exam reveals no gallop and no friction rub.   No murmur heard. Pulmonary/Chest: Effort normal and breath sounds normal. No respiratory distress. He has no wheezes.  Abdominal: Soft. He exhibits no distension.  Neurological: He is alert and oriented to person, place, and time.  Skin: Skin is warm and dry.  Lower extremity has with 1+ non-pitting edema.  No ulcers or sores detected.  Venous stasis blushing along shin bilaterally with shiny epidermis.  Feet are without edema or erythema.    Psychiatric: Mood and affect normal.      Assessment and Plan :  68 year old male is here with PMH listed above is here today for ED follow up  Constipation, unspecified constipation type The constipation appears to have resolved per hx and physical.  Diabetes is well controlled.  We will continue to monitor and maintain stool regimen.    Need for prophylactic vaccination with Streptococcus pneumoniae (Pneumococcus) and Influenza vaccines - Plan: Pneumococcal conjugate vaccine 13-valent IM  Type 2DM with peripheral autonomic neuropathy Stable, patient has intentional weight loss with diet and exercise.  Discontinued the glimepiride today.  Will follow up in 4 months  Reviewed with Dr. Garlan FairShaw Colinda Barth, PA-C [facility] 12/18/20157:28 PM

## 2014-01-18 NOTE — Patient Instructions (Signed)
Stop your glimeperide.  You do not need this since you have lost weight.  Keep up with your diet and exercise.  Continue your new medications for pooping - if you get diarrhea, consider stopping or cutting down on the senna.  Constipation Constipation is when a person has fewer than three bowel movements a week, has difficulty having a bowel movement, or has stools that are dry, hard, or larger than normal. As people grow older, constipation is more common. If you try to fix constipation with medicines that make you have a bowel movement (laxatives), the problem may get worse. Long-term laxative use may cause the muscles of the colon to become weak. A low-fiber diet, not taking in enough fluids, and taking certain medicines may make constipation worse.  CAUSES   Certain medicines, such as antidepressants, pain medicine, iron supplements, antacids, and water pills.   Certain diseases, such as diabetes, irritable bowel syndrome (IBS), thyroid disease, or depression.   Not drinking enough water.   Not eating enough fiber-rich foods.   Stress or travel.   Lack of physical activity or exercise.   Ignoring the urge to have a bowel movement.   Using laxatives too much.  SIGNS AND SYMPTOMS   Having fewer than three bowel movements a week.   Straining to have a bowel movement.   Having stools that are hard, dry, or larger than normal.   Feeling full or bloated.   Pain in the lower abdomen.   Not feeling relief after having a bowel movement.  DIAGNOSIS  Your health care provider will take a medical history and perform a physical exam. Further testing may be done for severe constipation. Some tests may include:  A barium enema X-ray to examine your rectum, colon, and, sometimes, your small intestine.   A sigmoidoscopy to examine your lower colon.   A colonoscopy to examine your entire colon. TREATMENT  Treatment will depend on the severity of your constipation and  what is causing it. Some dietary treatments include drinking more fluids and eating more fiber-rich foods. Lifestyle treatments may include regular exercise. If these diet and lifestyle recommendations do not help, your health care provider may recommend taking over-the-counter laxative medicines to help you have bowel movements. Prescription medicines may be prescribed if over-the-counter medicines do not work.  HOME CARE INSTRUCTIONS   Eat foods that have a lot of fiber, such as fruits, vegetables, whole grains, and beans.  Limit foods high in fat and processed sugars, such as french fries, hamburgers, cookies, candies, and soda.   A fiber supplement may be added to your diet if you cannot get enough fiber from foods.   Drink enough fluids to keep your urine clear or pale yellow.   Exercise regularly or as directed by your health care provider.   Go to the restroom when you have the urge to go. Do not hold it.   Only take over-the-counter or prescription medicines as directed by your health care provider. Do not take other medicines for constipation without talking to your health care provider first.  SEEK IMMEDIATE MEDICAL CARE IF:   You have bright red blood in your stool.   Your constipation lasts for more than 4 days or gets worse.   You have abdominal or rectal pain.   You have thin, pencil-like stools.   You have unexplained weight loss. MAKE SURE YOU:   Understand these instructions.  Will watch your condition.  Will get help right away if you are  not doing well or get worse. Document Released: 10/17/2003 Document Revised: 01/23/2013 Document Reviewed: 10/30/2012 [facility] Patient Information 2015 [place], [place]. This information is not intended to replace advice given to you by your health care provider. Make sure you discuss any questions you have with your health care provider.

## 2014-01-21 ENCOUNTER — Telehealth: Payer: Self-pay

## 2014-01-21 NOTE — Telephone Encounter (Signed)
[facility] states Mr Nicholas Hardin will not receive occupational home care as it is not applicable   Best phone for advanced home [facility] [phone]

## 2014-01-22 NOTE — Telephone Encounter (Signed)
LM for [name] to rtn call for home health care referral

## 2014-01-22 NOTE — Telephone Encounter (Signed)
I am not aware that Gwynneth MunsonButch was referred for occupational home health care by myself or any other provider. I do not know what this message is in regards to.  No orders under referrals.  Please double check w/ pt's caregivers to ensure that they were not wanting some type of home help. Thanks.

## 2014-01-31 ENCOUNTER — Other Ambulatory Visit: Payer: Self-pay

## 2014-01-31 MED ORDER — ALBUTEROL SULFATE HFA 108 (90 BASE) MCG/ACT IN AERS: 2.0000 | INHALATION_SPRAY | Freq: Four times a day (QID) | RESPIRATORY_TRACT | Status: DC | PRN

## 2014-01-31 NOTE — Progress Notes (Signed)
Subjective:    Patient ID: Nicholas Hardin H Trevor, male    DOB: 1945-10-01, 68 y.o.   MRN: [id number] Chief Complaint  Patient presents with  . Follow-up    hospital follow up  . Diabetes  . Hypertension     HPI    Here to f/u from ER visit due to constipation. No bowels regular with miralax. Doing well.  Still having unna boots applied occasionally if develops ulcer but legs great new. Mood good, getting lots of walking for exercise. Past Medical History  Diagnosis Date  . Diabetes mellitus without complication   . Hypertension   . Cognitive deficits   . Hyperlipidemia    Current Outpatient Prescriptions on File Prior to Visit  Medication Sig Dispense Refill  . albuterol (PROVENTIL HFA;VENTOLIN HFA) 108 (90 BASE) MCG/ACT inhaler Inhale 2 puffs into the lungs every 6 (six) hours as needed for wheezing or shortness of breath. 1 Inhaler 2  . aspirin 81 MG chewable tablet Chew 81 mg by mouth at bedtime.    Marland Kitchen. atenolol (TENORMIN) 25 MG tablet TAKE 1 TABLET BY MOUTH EVERY DAY 30 tablet 3  . bisacodyl (DULCOLAX) 10 MG suppository Place 1 suppository (10 mg total) rectally daily as needed for moderate constipation. 12 suppository 1  . Bismuth Tribromoph-Petrolatum (XEROFORM PETROLAT GAUZE 5"X9") MISC Apply 1 application topically every other day. 50 each 0  . Cyanocobalamin (B-12) 500 MCG TABS Take 500 mcg by mouth daily.     Marland Kitchen. docusate sodium 100 MG CAPS Take 100 mg by mouth 2 (two) times daily. 30 capsule 0  . lisinopril (PRINIVIL,ZESTRIL) 10 MG tablet Take 1 tablet (10 mg total) by mouth daily. 30 tablet 5  . Multiple Vitamin (MULTIVITAMIN) tablet Take 1 tablet by mouth daily.    Marland Kitchen. OVER THE COUNTER MEDICATION Take 1 tablet by mouth at bedtime. "Leg cramps pill"    . polyethylene glycol (MIRALAX / GLYCOLAX) packet Take 17 g by mouth daily as needed for moderate constipation. 14 each 1  . senna (SENOKOT) 8.6 MG TABS tablet Take 2 tablets (17.2 mg total) by mouth daily. 60 each 0  . sertraline  (ZOLOFT) 25 MG tablet TAKE 1 TABLET BY MOUTH EVERY DAY 30 tablet 5  . simvastatin (ZOCOR) 20 MG tablet Take 1 tablet (20 mg total) by mouth at bedtime. 30 tablet 4  . ammonium lactate (AMLACTIN) 12 % cream APPLY TOPICALLY TO AFFECTED TWICE DAILY (Patient not taking: Reported on 01/18/2014) 385 g 3  . mupirocin ointment (BACTROBAN) 2 % Place 1 application into the nose 2 (two) times daily. (Patient not taking: Reported on 01/18/2014) 22 g 0   No current facility-administered medications on file prior to visit.   Allergies  Allergen Reactions  . Sulfa Antibiotics Hives      Review of Systems  Constitutional: Negative for fever and chills.  Eyes: Negative for visual disturbance.  Respiratory: Negative for shortness of breath.   Cardiovascular: Negative for chest pain and leg swelling.  Neurological: Negative for dizziness, syncope, facial asymmetry, weakness, light-headedness and headaches.       Objective:  BP 128/64 mmHg  Pulse 60  Temp(Src) 97.6 F (36.4 C) (Oral)  Resp 16  Ht 5\' 4"  (1.626 m)  Wt 194 lb 9.6 oz (88.27 kg)  BMI 33.39 kg/m2  SpO2 97%  Physical Exam  Constitutional: He is oriented to person, place, and time. He appears well-developed and well-nourished. No distress.  HENT:  Head: Normocephalic and atraumatic.  Eyes: No scleral  icterus.  Pulmonary/Chest: Effort normal.  Neurological: He is alert and oriented to person, place, and time.  Skin: Skin is warm and dry. He is not diaphoretic.  1+ pitting edema in legs with venous stasis change of mild erythema, no ulceration or wheeping  Psychiatric: He has a normal mood and affect. His behavior is normal.          Assessment & Plan:  Constipation, unspecified constipation type  Screening for colorectal cancer - Plan: CANCELED: Ambulatory referral to Gastroenterology  Need for prophylactic vaccination with Streptococcus pneumoniae (Pneumococcus) and Influenza vaccines - Plan: Pneumococcal conjugate vaccine  13-valent IM  Type II diabetes mellitus with peripheral autonomic neuropathy   Pt assessed, reviewed documentation and agree w/ assessment and plan. Norberto SorensonEva Giavanni Odonovan, MD MPH

## 2014-01-31 NOTE — Telephone Encounter (Signed)
Dr Clelia CroftShaw, you just saw pt, but don't see this med discussed. Can we give RFs?

## 2014-02-04 DIAGNOSIS — I10 Essential (primary) hypertension: Secondary | ICD-10-CM | POA: Diagnosis not present

## 2014-02-04 DIAGNOSIS — E785 Hyperlipidemia, unspecified: Secondary | ICD-10-CM | POA: Diagnosis not present

## 2014-02-04 DIAGNOSIS — R112 Nausea with vomiting, unspecified: Secondary | ICD-10-CM | POA: Diagnosis not present

## 2014-02-04 DIAGNOSIS — E1165 Type 2 diabetes mellitus with hyperglycemia: Secondary | ICD-10-CM | POA: Diagnosis not present

## 2014-02-04 DIAGNOSIS — F329 Major depressive disorder, single episode, unspecified: Secondary | ICD-10-CM | POA: Diagnosis not present

## 2014-02-04 NOTE — Telephone Encounter (Signed)
Nurse requesting Home Health Care for PT 2-3 weeks 2 thimes a week   [phone]

## 2014-02-04 NOTE — Telephone Encounter (Signed)
Please advise ok to continue home health for pt.

## 2014-02-04 NOTE — Telephone Encounter (Signed)
Yes, this would be great, thanks.

## 2014-02-04 NOTE — Telephone Encounter (Signed)
LM with VO to continue PT next 3 weeks for twice a week.

## 2014-02-06 DIAGNOSIS — E785 Hyperlipidemia, unspecified: Secondary | ICD-10-CM | POA: Diagnosis not present

## 2014-02-06 DIAGNOSIS — E1165 Type 2 diabetes mellitus with hyperglycemia: Secondary | ICD-10-CM | POA: Diagnosis not present

## 2014-02-06 DIAGNOSIS — R112 Nausea with vomiting, unspecified: Secondary | ICD-10-CM | POA: Diagnosis not present

## 2014-02-06 DIAGNOSIS — F329 Major depressive disorder, single episode, unspecified: Secondary | ICD-10-CM | POA: Diagnosis not present

## 2014-02-06 DIAGNOSIS — I10 Essential (primary) hypertension: Secondary | ICD-10-CM | POA: Diagnosis not present

## 2014-02-11 DIAGNOSIS — I10 Essential (primary) hypertension: Secondary | ICD-10-CM | POA: Diagnosis not present

## 2014-02-11 DIAGNOSIS — R112 Nausea with vomiting, unspecified: Secondary | ICD-10-CM | POA: Diagnosis not present

## 2014-02-11 DIAGNOSIS — E1165 Type 2 diabetes mellitus with hyperglycemia: Secondary | ICD-10-CM | POA: Diagnosis not present

## 2014-02-11 DIAGNOSIS — E785 Hyperlipidemia, unspecified: Secondary | ICD-10-CM | POA: Diagnosis not present

## 2014-02-11 DIAGNOSIS — F329 Major depressive disorder, single episode, unspecified: Secondary | ICD-10-CM | POA: Diagnosis not present

## 2014-02-14 DIAGNOSIS — F329 Major depressive disorder, single episode, unspecified: Secondary | ICD-10-CM | POA: Diagnosis not present

## 2014-02-14 DIAGNOSIS — I10 Essential (primary) hypertension: Secondary | ICD-10-CM | POA: Diagnosis not present

## 2014-02-14 DIAGNOSIS — R112 Nausea with vomiting, unspecified: Secondary | ICD-10-CM | POA: Diagnosis not present

## 2014-02-14 DIAGNOSIS — E785 Hyperlipidemia, unspecified: Secondary | ICD-10-CM | POA: Diagnosis not present

## 2014-02-14 DIAGNOSIS — E1165 Type 2 diabetes mellitus with hyperglycemia: Secondary | ICD-10-CM | POA: Diagnosis not present

## 2014-02-19 DIAGNOSIS — E785 Hyperlipidemia, unspecified: Secondary | ICD-10-CM | POA: Diagnosis not present

## 2014-02-19 DIAGNOSIS — F329 Major depressive disorder, single episode, unspecified: Secondary | ICD-10-CM | POA: Diagnosis not present

## 2014-02-19 DIAGNOSIS — R112 Nausea with vomiting, unspecified: Secondary | ICD-10-CM | POA: Diagnosis not present

## 2014-02-19 DIAGNOSIS — I10 Essential (primary) hypertension: Secondary | ICD-10-CM | POA: Diagnosis not present

## 2014-02-19 DIAGNOSIS — E1165 Type 2 diabetes mellitus with hyperglycemia: Secondary | ICD-10-CM | POA: Diagnosis not present

## 2014-02-21 DIAGNOSIS — I10 Essential (primary) hypertension: Secondary | ICD-10-CM | POA: Diagnosis not present

## 2014-02-21 DIAGNOSIS — F329 Major depressive disorder, single episode, unspecified: Secondary | ICD-10-CM | POA: Diagnosis not present

## 2014-02-21 DIAGNOSIS — R112 Nausea with vomiting, unspecified: Secondary | ICD-10-CM | POA: Diagnosis not present

## 2014-02-21 DIAGNOSIS — E785 Hyperlipidemia, unspecified: Secondary | ICD-10-CM | POA: Diagnosis not present

## 2014-02-21 DIAGNOSIS — E1165 Type 2 diabetes mellitus with hyperglycemia: Secondary | ICD-10-CM | POA: Diagnosis not present

## 2014-02-25 DIAGNOSIS — E785 Hyperlipidemia, unspecified: Secondary | ICD-10-CM | POA: Diagnosis not present

## 2014-02-25 DIAGNOSIS — R112 Nausea with vomiting, unspecified: Secondary | ICD-10-CM | POA: Diagnosis not present

## 2014-02-25 DIAGNOSIS — E1165 Type 2 diabetes mellitus with hyperglycemia: Secondary | ICD-10-CM | POA: Diagnosis not present

## 2014-02-25 DIAGNOSIS — I10 Essential (primary) hypertension: Secondary | ICD-10-CM | POA: Diagnosis not present

## 2014-02-25 DIAGNOSIS — F329 Major depressive disorder, single episode, unspecified: Secondary | ICD-10-CM | POA: Diagnosis not present

## 2014-02-26 ENCOUNTER — Other Ambulatory Visit: Payer: Self-pay | Admitting: Family Medicine

## 2014-02-27 DIAGNOSIS — I10 Essential (primary) hypertension: Secondary | ICD-10-CM | POA: Diagnosis not present

## 2014-02-27 DIAGNOSIS — E1165 Type 2 diabetes mellitus with hyperglycemia: Secondary | ICD-10-CM | POA: Diagnosis not present

## 2014-02-27 DIAGNOSIS — F329 Major depressive disorder, single episode, unspecified: Secondary | ICD-10-CM | POA: Diagnosis not present

## 2014-02-27 DIAGNOSIS — R112 Nausea with vomiting, unspecified: Secondary | ICD-10-CM | POA: Diagnosis not present

## 2014-02-27 DIAGNOSIS — E785 Hyperlipidemia, unspecified: Secondary | ICD-10-CM | POA: Diagnosis not present

## 2014-03-18 ENCOUNTER — Ambulatory Visit: Payer: Medicare Other | Admitting: Podiatry

## 2014-03-25 ENCOUNTER — Other Ambulatory Visit: Payer: Self-pay | Admitting: Family Medicine

## 2014-04-22 ENCOUNTER — Ambulatory Visit (INDEPENDENT_AMBULATORY_CARE_PROVIDER_SITE_OTHER): Payer: Medicare Other | Admitting: Podiatry

## 2014-04-22 ENCOUNTER — Encounter: Payer: Self-pay | Admitting: Podiatry

## 2014-04-22 DIAGNOSIS — M79676 Pain in unspecified toe(s): Secondary | ICD-10-CM

## 2014-04-22 DIAGNOSIS — B351 Tinea unguium: Secondary | ICD-10-CM

## 2014-04-22 NOTE — Patient Instructions (Signed)
Diabetes and Foot Care Diabetes may cause you to have problems because of poor blood supply (circulation) to your feet and legs. This may cause the skin on your feet to become thinner, break easier, and heal more slowly. Your skin may become dry, and the skin may peel and crack. You may also have nerve damage in your legs and feet causing decreased feeling in them. You may not notice minor injuries to your feet that could lead to infections or more serious problems. Taking care of your feet is one of the most important things you can do for yourself.  HOME CARE INSTRUCTIONS  Wear shoes at all times, even in the house. Do not go barefoot. Bare feet are easily injured.  Check your feet daily for blisters, cuts, and redness. If you cannot see the bottom of your feet, use a mirror or ask someone for help.  Wash your feet with warm water (do not use hot water) and mild soap. Then pat your feet and the areas between your toes until they are completely dry. Do not soak your feet as this can dry your skin.  Apply a moisturizing lotion or petroleum jelly (that does not contain alcohol and is unscented) to the skin on your feet and to dry, brittle toenails. Do not apply lotion between your toes.  Trim your toenails straight across. Do not dig under them or around the cuticle. File the edges of your nails with an emery board or nail file.  Do not cut corns or calluses or try to remove them with medicine.  Wear clean socks or stockings every day. Make sure they are not too tight. Do not wear knee-high stockings since they may decrease blood flow to your legs.  Wear shoes that fit properly and have enough cushioning. To break in new shoes, wear them for just a few hours a day. This prevents you from injuring your feet. Always look in your shoes before you put them on to be sure there are no objects inside.  Do not cross your legs. This may decrease the blood flow to your feet.  If you find a minor scrape,  cut, or break in the skin on your feet, keep it and the skin around it clean and dry. These areas may be cleansed with mild soap and water. Do not cleanse the area with peroxide, alcohol, or iodine.  When you remove an adhesive bandage, be sure not to damage the skin around it.  If you have a wound, look at it several times a day to make sure it is healing.  Do not use heating pads or hot water bottles. They may burn your skin. If you have lost feeling in your feet or legs, you may not know it is happening until it is too late.  Make sure your health care provider performs a complete foot exam at least annually or more often if you have foot problems. Report any cuts, sores, or bruises to your health care provider immediately. SEEK MEDICAL CARE IF:   You have an injury that is not healing.  You have cuts or breaks in the skin.  You have an ingrown nail.  You notice redness on your legs or feet.  You feel burning or tingling in your legs or feet.  You have pain or cramps in your legs and feet.  Your legs or feet are numb.  Your feet always feel cold. SEEK IMMEDIATE MEDICAL CARE IF:   There is increasing redness,   swelling, or pain in or around a wound.  There is a red line that goes up your leg.  Pus is coming from a wound.  You develop a fever or as directed by your health care provider.  You notice a bad smell coming from an ulcer or wound. Document Released: 01/16/2000 Document Revised: 09/20/2012 Document Reviewed: 06/27/2012 ExitCare Patient Information 2015 [employer]. This information is not intended to replace advice given to you by your health care provider. Make sure you discuss any questions you have with your health care provider.  

## 2014-04-22 NOTE — Progress Notes (Signed)
Patient ID: Nicholas Hardin, male   DOB: March 21, 1945, 69 y.o.   MRN: [id number]  Subjective: This patient presents again complaining of toenails. His cousin is present in the treatment room today.  Objective: The toenails are hypertrophic, elongated, discolored and tender to palpation 6-10  Assessment: Symptomatic onychomycoses 6-10 History of diabetes  Plan: Debridement of toenails 10 without any  Bleeding  Reappoint at three-month intervals

## 2014-05-24 ENCOUNTER — Ambulatory Visit (INDEPENDENT_AMBULATORY_CARE_PROVIDER_SITE_OTHER): Payer: Medicare Other | Admitting: Family Medicine

## 2014-05-24 VITALS — BP 142/65 | HR 57 | Temp 97.4°F | Resp 16 | Ht 65.0 in | Wt 187.0 lb

## 2014-05-24 DIAGNOSIS — G990 Autonomic neuropathy in diseases classified elsewhere: Secondary | ICD-10-CM

## 2014-05-24 DIAGNOSIS — E1159 Type 2 diabetes mellitus with other circulatory complications: Secondary | ICD-10-CM | POA: Diagnosis not present

## 2014-05-24 DIAGNOSIS — E785 Hyperlipidemia, unspecified: Secondary | ICD-10-CM | POA: Diagnosis not present

## 2014-05-24 DIAGNOSIS — Z79899 Other long term (current) drug therapy: Secondary | ICD-10-CM | POA: Diagnosis not present

## 2014-05-24 DIAGNOSIS — E1143 Type 2 diabetes mellitus with diabetic autonomic (poly)neuropathy: Secondary | ICD-10-CM

## 2014-05-24 DIAGNOSIS — I10 Essential (primary) hypertension: Secondary | ICD-10-CM

## 2014-05-24 DIAGNOSIS — E1151 Type 2 diabetes mellitus with diabetic peripheral angiopathy without gangrene: Secondary | ICD-10-CM

## 2014-05-24 DIAGNOSIS — E1142 Type 2 diabetes mellitus with diabetic polyneuropathy: Secondary | ICD-10-CM | POA: Diagnosis not present

## 2014-05-24 DIAGNOSIS — E669 Obesity, unspecified: Secondary | ICD-10-CM

## 2014-05-24 DIAGNOSIS — E8881 Metabolic syndrome: Secondary | ICD-10-CM

## 2014-05-24 DIAGNOSIS — Z1211 Encounter for screening for malignant neoplasm of colon: Secondary | ICD-10-CM

## 2014-05-24 LAB — HEMOGLOBIN A1C
Hgb A1c MFr Bld: 6.8 % — ABNORMAL HIGH (ref <5.7)
Mean Plasma Glucose: 148 mg/dL — ABNORMAL HIGH (ref <117)

## 2014-05-24 LAB — POCT URINALYSIS DIPSTICK
BILIRUBIN UA: NEGATIVE
Blood, UA: NEGATIVE
GLUCOSE UA: NEGATIVE
KETONES UA: NEGATIVE
Leukocytes, UA: NEGATIVE
Nitrite, UA: NEGATIVE
Protein, UA: NEGATIVE
Spec Grav, UA: 1.015
Urobilinogen, UA: 0.2
pH, UA: 5.5

## 2014-05-24 LAB — COMPREHENSIVE METABOLIC PANEL
ALT: 12 U/L (ref 0–53)
AST: 15 U/L (ref 0–37)
Albumin: 4.5 g/dL (ref 3.5–5.2)
Alkaline Phosphatase: 60 U/L (ref 39–117)
BUN: 19 mg/dL (ref 6–23)
CALCIUM: 9.6 mg/dL (ref 8.4–10.5)
CO2: 29 meq/L (ref 19–32)
CREATININE: 0.87 mg/dL (ref 0.50–1.35)
Chloride: 104 mEq/L (ref 96–112)
GLUCOSE: 137 mg/dL — AB (ref 70–99)
Potassium: 4.5 mEq/L (ref 3.5–5.3)
SODIUM: 144 meq/L (ref 135–145)
Total Bilirubin: 0.7 mg/dL (ref 0.2–1.2)
Total Protein: 7.2 g/dL (ref 6.0–8.3)

## 2014-05-24 LAB — LIPID PANEL
CHOLESTEROL: 175 mg/dL (ref 0–200)
HDL: 42 mg/dL (ref >=40)
LDL CALC: 101 mg/dL — AB (ref 0–99)
Total CHOL/HDL Ratio: 4.2 Ratio
Triglycerides: 160 mg/dL — ABNORMAL HIGH (ref <150)
VLDL: 32 mg/dL (ref 0–40)

## 2014-05-24 LAB — MICROALBUMIN / CREATININE URINE RATIO
CREATININE, URINE: 85.4 mg/dL
MICROALB UR: 0.5 mg/dL (ref <2.0)
Microalb Creat Ratio: 5.9 mg/g (ref 0.0–30.0)

## 2014-05-24 LAB — TSH: TSH: 4.254 u[IU]/mL (ref 0.350–4.500)

## 2014-05-24 MED ORDER — ZOSTER VACCINE LIVE 19400 UNT/0.65ML ~~LOC~~ SOLR: 0.6500 mL | Freq: Once | SUBCUTANEOUS | Status: DC

## 2014-05-24 MED ORDER — LISINOPRIL 10 MG PO TABS: 10.0000 mg | ORAL_TABLET | Freq: Every day | ORAL | Status: DC

## 2014-05-24 MED ORDER — SIMVASTATIN 20 MG PO TABS: 20.0000 mg | ORAL_TABLET | Freq: Every day | ORAL | Status: DC

## 2014-05-24 NOTE — Progress Notes (Addendum)
Subjective:    Patient ID: Nicholas Hardin, male    DOB: 04-28-1945, 69 y.o.   MRN: [id number] This chart was scribed for Norberto Sorenson, MD by Littie Deeds, Medical Scribe. This patient was seen in Room 25 and the patient's care was started at 9:11 AM.  Chief Complaint  Patient presents with  . Hypertension    follow up     HPI HPI Comments: Nicholas Hardin is a 69 y.o. male with a hx of DM, HTN, and HLD who presents to the [facility] for a follow-up.  He goes by Nicholas Hardin. 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 He was living with his aunt until she passed away and is now living with his aunt's sister, who is not in his direct family. They were trying to encourage him to leave the house to go to adult daycare, but he was anxious and unable to do so. Fortunately, there has been an adult son (aunt's sister's son) who has become a great caregiver to [facility] - helps administer medications and treat his chronic lower extremity venous ulcers. Last lipid panel was August, 2015. High triglycerides, low LDL, consistent with metabolic syndrome - LDL at goal. Last vitamin J47 was November, 2014 which was elevated with normal MMA. Normal urine micro albumin in May, 2014. Colonoscopy done April, 2010 showed an adenomatous polyp in the descending colon and the sigmoid colon. Patient has been given a script for Zostovax previously, unsure if he received it. Immunizations UTD, will only need flu vaccine.  Patient has been exercising and eating healthier; he has lost weight, down from 194 lbs to 187 lbs. His walking has also improved significantly. He has been drinking more water and has been taking baby aspirin. He did not take his medications today in anticipation of blood work. He is scheduled to see his eye doctor, who he sees yearly, this coming September; he does not remember the name of his eye doctor. No depression symptoms per family member.  Patient states he has received his shingles vaccine. He states he has been sleeping well. Patient denies chest pain, SOB, nausea and bowel problems.   Past Medical History  Diagnosis Date  . Diabetes mellitus without complication   . Hypertension   . Cognitive deficits   . Hyperlipidemia    Current Outpatient Prescriptions on File Prior to Visit  Medication Sig Dispense Refill  . albuterol (PROVENTIL HFA;VENTOLIN HFA) 108 (90 BASE) MCG/ACT inhaler Inhale 2 puffs into the lungs every 6 (six) hours as needed for wheezing or shortness of breath. 1 Inhaler 3  . ammonium lactate (AMLACTIN) 12 % cream APPLY TOPICALLY TO AFFECTED TWICE DAILY 385 g 3  . aspirin 81 MG chewable tablet Chew 81 mg by mouth at bedtime.    . bisacodyl (DULCOLAX) 10 MG suppository Place 1 suppository (10 mg total) rectally daily as needed for moderate constipation. 12 suppository 1  . Bismuth Tribromoph-Petrolatum (XEROFORM PETROLAT GAUZE 5"X9") MISC Apply 1 application topically every other day. 50 each 0  . Cyanocobalamin (B-12) 500 MCG TABS Take 500 mcg by mouth daily.     Marland Kitchen docusate sodium 100 MG CAPS Take 100 mg by mouth 2 (two) times daily. 30 capsule 0  . Multiple Vitamin (MULTIVITAMIN) tablet Take 1 tablet by mouth daily.    . mupirocin ointment (BACTROBAN) 2 % Place 1 application into the nose 2 (two) times daily. 22 g 0  .  OVER THE COUNTER MEDICATION Take 1 tablet by mouth at bedtime. "Leg cramps pill"    . polyethylene glycol (MIRALAX / GLYCOLAX) packet Take 17 g by mouth daily as needed for moderate constipation. 14 each 1  . senna (SENOKOT) 8.6 MG TABS tablet Take 2 tablets (17.2 mg total) by mouth daily. 60 each 0   No current facility-administered medications on file prior to visit.   Allergies  Allergen Reactions  . Sulfa Antibiotics Hives     Review of Systems  Constitutional: Negative for fever and chills.  Eyes: Negative for visual disturbance.  Respiratory: Negative for shortness of breath.     Cardiovascular: Negative for chest pain and leg swelling.  Gastrointestinal: Negative.  Negative for nausea, vomiting, abdominal pain, diarrhea and constipation.  Endocrine: Negative for polydipsia, polyphagia and polyuria.  Skin: Positive for color change and rash.  Neurological: Negative for dizziness, syncope, facial asymmetry, weakness, light-headedness and headaches.  Hematological: Negative for adenopathy. Bruises/bleeds easily.  Psychiatric/Behavioral: Positive for decreased concentration. Negative for sleep disturbance and dysphoric mood. The patient is not nervous/anxious.        Objective:  BP 142/65 mmHg  Pulse 57  Temp(Src) 97.4 F (36.3 C) (Oral)  Resp 16  Ht 5\' 5"  (1.651 m)  Wt 187 lb (84.823 kg)  BMI 31.12 kg/m2  SpO2 98%  Physical Exam  Constitutional: He is oriented to person, place, and time. He appears well-developed and well-nourished. No distress.  HENT:  Head: Normocephalic and atraumatic.  Mouth/Throat: Oropharynx is clear and moist. No oropharyngeal exudate.  Eyes: Pupils are equal, round, and reactive to light.  Neck: Neck supple.  Cardiovascular: Normal rate, regular rhythm, S1 normal, S2 normal and normal heart sounds.   No murmur heard. Pulmonary/Chest: Effort normal and breath sounds normal. No respiratory distress. He has no wheezes. He has no rales.  Musculoskeletal: He exhibits no edema.  Neurological: He is alert and oriented to person, place, and time. No cranial nerve deficit.  Skin: Skin is warm and dry. Rash noted. Rash is macular. There is erythema.  Bilateral lower ext to mid calf mild blanching erythema w/o warmth or tenderness. One prior ulcer almost completely healed with small area of escar. Skin thickened w/ 2+ edema due to venous stasis change.  Psychiatric: He has a normal mood and affect. His speech is normal and behavior is normal. Thought content normal. Cognition and memory are impaired.  Vitals reviewed.         Assessment  & Plan:   1. Metabolic syndrome   2. Type II diabetes mellitus with peripheral autonomic neuropathy  3. Type 2 diabetes mellitus with peripheral circulatory disorder  - venous stasis bilaterally w/ h/o ulcers which his caregivers have been providing excellent wound care occ w/ unna boots but ulcers have substantially decreased w/ weightloss and exercise as well.  Not on any DM meds and hgba1c has been 5.3 to 6.3 over past year but up to 6.8 today - will ask pt to RTC for quick visit in 3 mos to recheck a1c and restart low dose metformin a1c is still >6.5 - metformin may actually help his chronic constipation . . .   4. Essential hypertension, benign - stable, cont lisinopril 10  5. Polypharmacy   6. Hyperlipidemia LDL goal <100 - fasting today - stable on zocor 20.  7. Obesity  - pt with continued excellent weight loss over past year as has had a healthier diet since living with different family and is walking  more for exercise - pt very happy and notes easier to exercise, clothes fit  8. Special screening for malignant neoplasms, colon - had adenomatous polyp in 2010 so was supposed to repeat in 5 yrs - pt doesn't remember what group saw him prior.  9.    H/o Extended grief - pt was started on zoloft sev mos after his aunt Delice Bison (who was also a pt of mine) passed away last yr as pt was having freq unprovoked crying almost daily - no prior h/o mood d/o and all sxs now resolved so try stopping zoloft 25 - can always restart prn.  Pt advised to sched AWV at next OV in 6 mos.  Has [facility] shingles vaccine [facility]   Orders Placed This Encounter  Procedures  . Comprehensive metabolic panel    Order Specific Question:  Has the patient fasted?    Answer:  Yes  . TSH  . Lipid panel    Order Specific Question:  Has the patient fasted?    Answer:  Yes  . Microalbumin/Creatinine Ratio, Urine  . Hemoglobin A1c  . Ambulatory referral to Gastroenterology    Referral Priority:  Routine     Referral Type:  Consultation    Referral Reason:  Specialty Services Required    Requested Specialty:  Gastroenterology    Number of Visits Requested:  1  . POCT urinalysis dipstick    Meds ordered this encounter  Medications  . zoster vaccine live, PF, (ZOSTAVAX) 40981 UNT/0.65ML injection    Sig: Inject 19,400 Units into the skin once.    Dispense:  1 vial    Refill:  0  . simvastatin (ZOCOR) 20 MG tablet    Sig: Take 1 tablet (20 mg total) by mouth daily at 6 PM.    Dispense:  90 tablet    Refill:  3  . lisinopril (PRINIVIL,ZESTRIL) 10 MG tablet    Sig: Take 1 tablet (10 mg total) by mouth daily.    Dispense:  90 tablet    Refill:  2    I personally performed the services described in this documentation, which was scribed in my presence. The recorded information has been reviewed and considered, and addended by me as needed.  Norberto Sorenson, MD MPH  Results for orders placed or performed in visit on 05/24/14  Comprehensive metabolic panel  Result Value Ref Range   Sodium 144 135 - 145 mEq/L   Potassium 4.5 3.5 - 5.3 mEq/L   Chloride 104 96 - 112 mEq/L   CO2 29 19 - 32 mEq/L   Glucose, Bld 137 (H) 70 - 99 mg/dL   BUN 19 6 - 23 mg/dL   Creat 1.91 4.78 - 2.95 mg/dL   Total Bilirubin 0.7 0.2 - 1.2 mg/dL   Alkaline Phosphatase 60 39 - 117 U/L   AST 15 0 - 37 U/L   ALT 12 0 - 53 U/L   Total Protein 7.2 6.0 - 8.3 g/dL   Albumin 4.5 3.5 - 5.2 g/dL   Calcium 9.6 8.4 - 62.1 mg/dL  TSH  Result Value Ref Range   TSH 4.254 0.350 - 4.500 uIU/mL  Lipid panel  Result Value Ref Range   Cholesterol 175 0 - 200 mg/dL   Triglycerides 308 (H) <150 mg/dL   HDL 42 >=65 mg/dL   Total CHOL/HDL Ratio 4.2 Ratio   VLDL 32 0 - 40 mg/dL   LDL Cholesterol 784 (H) 0 - 99 mg/dL  Microalbumin/Creatinine Ratio,  Urine  Result Value Ref Range   Microalb, Ur 0.5 <2.0 mg/dL   Creatinine, Urine 16.1 mg/dL   Microalb Creat Ratio 5.9 0.0 - 30.0 mg/g  Hemoglobin A1c  Result Value Ref Range   Hgb A1c  MFr Bld 6.8 (H) <5.7 %   Mean Plasma Glucose 148 (H) <117 mg/dL  POCT urinalysis dipstick  Result Value Ref Range   Color, UA yellow    Clarity, UA clear    Glucose, UA neg    Bilirubin, UA neg    Ketones, UA neg    Spec Grav, UA 1.015    Blood, UA neg    pH, UA 5.5    Protein, UA neg    Urobilinogen, UA 0.2    Nitrite, UA neg    Leukocytes, UA Negative

## 2014-05-24 NOTE — Patient Instructions (Addendum)
You are doing AMAZING!!!!   I am SO PROUD of you!     Keep up the diet and walking - you have made such a HUGE difference in your health.  Stop your zoloft (sertraline).  If you are feeling more blah or enjoying your walks less or crying more than you can always restart it if you want.   I am going to ask that the GI doctors office call you to set you up for a follow-up colonoscopy as I think you are do.  Continue your baby aspirin every day.  When I see you back in 6 months we will do a complete physical but you do not need to be fasting and you can take your medications that morning.    Metabolic Syndrome, Adult Metabolic syndrome describes a group of risk factors for heart disease and diabetes. This syndrome has other names including Insulin Resistance Syndrome. The more risk factors you have, the higher your risk of having a heart attack, stroke, or developing diabetes. These risk factors include:  High blood sugar.  High blood triglyceride (a fat found in the blood) level.  High blood pressure.  Abdominal obesity (your extra weight is around your waist instead of your hips).  Low levels of high-density lipoprotein, HDL (good blood cholesterol). If you have any three of these risk factors, you have metabolic syndrome. If you have even one of these factors, you should make lifestyle changes to improve your health in order to prevent serious health diseases.  In people with metabolic syndrome, the cells do not respond properly to insulin. This can lead to high levels of glucose in the blood, which can interfere with normal body processes. Eventually, this can cause high blood pressure and higher fat levels in the blood, and inflammation of your blood vessels. The result can be heart disease and stroke.  CAUSES   Eating a diet rich in calories and saturated fat.  Too little physical activity.  Being overweight. Other underlying causes are:  Family history (genetics).  Ethnicity (South  Asians are at a higher risk).  Older age (your chances of developing metabolic syndrome are higher as you grow older).  Insulin resistance. SYMPTOMS  By itself, metabolic syndrome has no symptoms. However, you might have symptoms of diabetes (high blood sugar) or high blood pressure, such as:  Increased thirst, urination, and tiredness.  Dizzy spells.  Dull headaches that are unusual for you.  Blurred vision.  Nosebleeds. DIAGNOSIS  Your caregiver may make a diagnosis of metabolic syndrome if you have at least three of these factors:  If you are overweight mostly around the waist. This means a waistline greater than 40" in men and more than 35" in women. The waistline limits are 31 to 35 inches for women and 37 to 39 inches for men. In those who have certain genetic risk factors, such as having a family history of diabetes or being of Asian descent.  If you have a blood pressure of 130/85 mm Hg or more, or if you are being treated for high blood pressure.  If your blood triglyceride level is 150 mg/dL or more, or you are being treated for high levels of triglyceride.  If the level of HDL in your blood is below 40 mg/dL in men, less than 50 mg/dL in women, or you are receiving treatment for low levels of HDL.  If the level of sugar in your blood is high with fasting blood sugar level of 110 mg/dL or  more, or you are under treatment for diabetes. TREATMENT  Your caregiver may have you make lifestyle changes, which may include:  Exercise.  Losing weight.  Maintaining a healthy diet.  Quitting smoking. The lifestyle changes listed above are key in reducing your risk for heart disease and stroke. Medicines may also be prescribed to help your body respond to insulin better and to reduce your blood pressure and blood fat levels. Aspirin may be recommended to reduce risks of heart disease or stroke.  HOME CARE INSTRUCTIONS   Exercise.  Measure your waist at regular intervals just  above the hipbones after you have breathed out.  Maintain a healthy diet.  Eat fruits, such as apples, oranges, and pears.  Eat vegetables.  Eat legumes, such as kidney beans, peas, and lentils.  Eat food rich in soluble fiber, such as whole grain cereal, oatmeal, and oat bran.  Use olive or safflower oils and avoid saturated fats.  Eat nuts.  Limit the amount of salt you eat or add to food.  Limit the amount of alcohol you drink.  Include fish in your diet, if possible.  Stop smoking if you are a smoker.  Maintain regular follow-up appointments.  Follow your caregiver's advice. SEEK MEDICAL CARE IF:   You feel very tired or fatigued.  You develop excessive thirst.  You pass large quantities of urine.  You are putting on weight around your waist rather than losing weight.  You develop headaches over and over again.  You have off-and-on dizzy spells. SEEK IMMEDIATE MEDICAL CARE IF:   You develop nosebleeds.  You develop sudden blurred vision.  You develop sudden dizzy spells.  You develop chest pains, trouble breathing, or feel an abnormal or irregular heart beat.  You have a fainting episode.  You develop any sudden trouble speaking and/or swallowing.  You develop sudden weakness in one arm and/or one leg. MAKE SURE YOU:   Understand these instructions.  Will watch your condition.  Will get help right away if you are not doing well or get worse. Document Released: 04/27/2007 Document Revised: 06/04/2013 Document Reviewed: 04/27/2007 [facility] Patient Information 2015 [employer], [place]. This information is not intended to replace advice given to you by your health care provider. Make sure you discuss any questions you have with your health care provider. Diabetes Mellitus and Food It is important for you to manage your blood sugar (glucose) level. Your blood glucose level can be greatly affected by what you eat. Eating healthier foods in the appropriate  amounts throughout the day at about the same time each day will help you control your blood glucose level. It can also help slow or prevent worsening of your diabetes mellitus. Healthy eating may even help you improve the level of your blood pressure and reach or maintain a healthy weight.  HOW CAN FOOD AFFECT ME? Carbohydrates Carbohydrates affect your blood glucose level more than any other type of food. Your dietitian will help you determine how many carbohydrates to eat at each meal and teach you how to count carbohydrates. Counting carbohydrates is important to keep your blood glucose at a healthy level, especially if you are using insulin or taking certain medicines for diabetes mellitus. Alcohol Alcohol can cause sudden decreases in blood glucose (hypoglycemia), especially if you use insulin or take certain medicines for diabetes mellitus. Hypoglycemia can be a life-threatening condition. Symptoms of hypoglycemia (sleepiness, dizziness, and disorientation) are similar to symptoms of having too much alcohol.  If your health care provider  has given you approval to drink alcohol, do so in moderation and use the following guidelines:  Women should not have more than one drink per day, and men should not have more than two drinks per day. One drink is equal to:  12 oz of beer.  5 oz of wine.  1 oz of hard liquor.  Do not drink on an empty stomach.  Keep yourself hydrated. Have water, diet soda, or unsweetened iced tea.  Regular soda, juice, and other mixers might contain a lot of carbohydrates and should be counted. WHAT FOODS ARE NOT RECOMMENDED? As you make food choices, it is important to remember that all foods are not the same. Some foods have fewer nutrients per serving than other foods, even though they might have the same number of calories or carbohydrates. It is difficult to get your body what it needs when you eat foods with fewer nutrients. Examples of foods that you should avoid  that are high in calories and carbohydrates but low in nutrients include:  Trans fats (most processed foods list trans fats on the Nutrition Facts label).  Regular soda.  Juice.  Candy.  Sweets, such as cake, pie, doughnuts, and cookies.  Fried foods. WHAT FOODS CAN I EAT? Have nutrient-rich foods, which will nourish your body and keep you healthy. The food you should eat also will depend on several factors, including:  The calories you need.  The medicines you take.  Your weight.  Your blood glucose level.  Your blood pressure level.  Your cholesterol level. You also should eat a variety of foods, including:  Protein, such as meat, poultry, fish, tofu, nuts, and seeds (lean animal proteins are best).  Fruits.  Vegetables.  Dairy products, such as milk, cheese, and yogurt (low fat is best).  Breads, grains, pasta, cereal, rice, and beans.  Fats such as olive oil, trans fat-free margarine, canola oil, avocado, and olives. DOES EVERYONE WITH DIABETES MELLITUS HAVE THE SAME MEAL PLAN? Because every person with diabetes mellitus is different, there is not one meal plan that works for everyone. It is very important that you meet with a dietitian who will help you create a meal plan that is just right for you. Document Released: 10/15/2004 Document Revised: 01/23/2013 Document Reviewed: 12/15/2012 [employer] Patient Information 2015 [place], [place]. This information is not intended to replace advice given to you by your health care provider. Make sure you discuss any questions you have with your health care provider.

## 2014-05-27 ENCOUNTER — Other Ambulatory Visit: Payer: Self-pay | Admitting: Family Medicine

## 2014-05-27 ENCOUNTER — Encounter: Payer: Self-pay | Admitting: Family Medicine

## 2014-05-27 ENCOUNTER — Other Ambulatory Visit: Payer: Self-pay | Admitting: Physician Assistant

## 2014-06-19 ENCOUNTER — Other Ambulatory Visit: Payer: Self-pay | Admitting: Family Medicine

## 2014-06-19 ENCOUNTER — Other Ambulatory Visit: Payer: Self-pay | Admitting: Physician Assistant

## 2014-07-23 ENCOUNTER — Other Ambulatory Visit: Payer: Self-pay | Admitting: Family Medicine

## 2014-07-23 ENCOUNTER — Other Ambulatory Visit: Payer: Self-pay | Admitting: Physician Assistant

## 2014-07-24 NOTE — Telephone Encounter (Signed)
Nicholas Hardin has an appointment  On 11/29/14 is looking to fill his Zoloft until then he was last seen her in 8/15 can we refill?

## 2014-07-25 ENCOUNTER — Other Ambulatory Visit: Payer: Self-pay | Admitting: Family Medicine

## 2014-07-25 NOTE — Telephone Encounter (Signed)
Received refill req for atenolol 25 - on review of chart it appears that this med my have been accidentally discontinued on [facility] by triage at his podiatrists office sev mos ago -  ok to refill x 1 yr if he has been taking it within the last few months but if he has discontinued would recommend not restarting it for now and can address at his visit in August.  FYI: pt "butch" w/ mild intellectual disability and so his family members often keep track of his meds.

## 2014-07-25 NOTE — Telephone Encounter (Signed)
Received pharmacy refill req for zoloft 25mg  but at last OV we decided to d/c zoloft - fine to continue if pt ("Nicholas Hardin") or his caregivers prefer (pt with mild intellectual disability so always has one of his family members accompany him to visit to help w/ decision making- fine to discuss with anyone in household) but I suspect he really doesn't need this and this was a routine refill rx independently generated by his mail order pharmacy.  OV notes from 05/2013: H/o Extended grief - pt was started on zoloft sev mos after his aunt Delice Bison (who was also a pt of mine) passed away last yr as pt was having freq unprovoked crying almost daily - no prior h/o mood d/o and all sxs now resolved so try stopping zoloft 25 - can always restart prn

## 2014-07-26 NOTE — Telephone Encounter (Signed)
SPoke with care giver and she stated pt is not on this medication. Advised I would refuse Rx and he could discuss this at the office visit with Dr. Clelia Croft.

## 2014-10-18 ENCOUNTER — Other Ambulatory Visit: Payer: Self-pay | Admitting: Family Medicine

## 2014-11-01 ENCOUNTER — Telehealth: Payer: Self-pay | Admitting: Family Medicine

## 2014-11-01 NOTE — Telephone Encounter (Signed)
lmom to call and reschedule his CPE with Clelia Croft that he had on 11/28/14

## 2014-11-07 DIAGNOSIS — Z961 Presence of intraocular lens: Secondary | ICD-10-CM | POA: Diagnosis not present

## 2014-11-28 ENCOUNTER — Encounter: Payer: Medicare Other | Admitting: Family Medicine

## 2014-11-29 ENCOUNTER — Encounter: Payer: Medicare Other | Admitting: Family Medicine

## 2015-02-12 ENCOUNTER — Other Ambulatory Visit: Payer: Self-pay | Admitting: Family Medicine

## 2015-03-13 ENCOUNTER — Other Ambulatory Visit: Payer: Self-pay | Admitting: Family Medicine

## 2015-03-24 NOTE — Progress Notes (Signed)
Subjective:    Nicholas Hardin is a 70 y.o. male who presents for Medicare Annual/Subsequent preventive examination.  His prior CPE was over 2 yrs prior with me.  Preventive Screening-Counseling & Management  Tobacco History  Smoking status  . Never Smoker   Smokeless tobacco  . Never Used    Problems Prior to Visit 1. Mental and physical disability - lives with distant relatives through marriage. Stays home and watches tv during day. Goes out on a walk for exercise sev times/d.  Unable to step over lip of tub so needs walk-in or shower chair. Cannot do stairs. Pt's caregivers tried to get him into adult day programs but pt refused due to anxiety about leaving house. 2.  Type 2 DM - controlled with diet/exercises - pt was on metfomrin and sulfonylurea prior but when he moved in with his current caregivers was able to wean off due to improved food/meals that were being prepared and had access to.  Last a1c 6.8 <- 5.3. 3.  HPL - on simvastatin 20 4.  HTN - on lisinopril 10 - has been able to wean off of atenolol and hctz since diet improved when his prior caregiver passed away. 5.  Chronic constipation  - was so severe last yr that pt required hospitalization, now has resolved and uses miralax prn. 6.   H/o vit B12 def -  7.  H/o bilateral venous stasis ulcers and cellulitis treated with unna boots - just recently noticed that there have been some popped blisters on his right lower leg with increased swelling and redness  Current Problems (verified) Patient Active Problem List   Diagnosis Date Noted  . Metabolic syndrome 05/24/2014  . Diabetes mellitus type 2, uncomplicated (HCC) 01/10/2014  . Essential hypertension 01/10/2014  . Hyperlipidemia LDL goal <100 09/21/2013  . Mental disability 06/15/2013  . Type 2 diabetes mellitus with peripheral circulatory disorder (HCC) 12/15/2012  . Vitamin B12 deficiency 12/15/2012  . 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 Essential hypertension, benign 12/15/2012  . Polypharmacy 12/15/2012    Medications Prior to Visit Current Outpatient Prescriptions on File Prior to Visit  Medication Sig Dispense Refill  . albuterol (PROVENTIL HFA;VENTOLIN HFA) 108 (90 BASE) MCG/ACT inhaler Inhale 2 puffs into the lungs every 6 (six) hours as needed for wheezing or shortness of breath. 1 Inhaler 3  . ammonium lactate (AMLACTIN) 12 % cream APPLY TOPICALLY TO AFFECTED TWICE DAILY 385 g 3  . aspirin 81 MG chewable tablet Chew 81 mg by mouth at bedtime.    . bisacodyl (DULCOLAX) 10 MG suppository Place 1 suppository (10 mg total) rectally daily as needed for moderate constipation. 12 suppository 1  . Bismuth Tribromoph-Petrolatum (XEROFORM PETROLAT GAUZE 5"X9") MISC Apply 1 application topically every other day. 50 each 0  . Cyanocobalamin (B-12) 500 MCG TABS Take 500 mcg by mouth daily.     Marland Kitchen docusate sodium 100 MG CAPS Take 100 mg by mouth 2 (two) times daily. 30 capsule 0  . lisinopril (PRINIVIL,ZESTRIL) 10 MG tablet Take 1 tablet (10 mg total) by mouth daily. 90 tablet 2  . lisinopril (PRINIVIL,ZESTRIL) 10 MG tablet TAKE 1 TABLET BY MOUTH EVERY DAY 30 tablet 0  . Multiple Vitamin (MULTIVITAMIN) tablet Take 1 tablet by mouth daily.    . mupirocin ointment (BACTROBAN) 2 % Place 1 application into the nose 2 (two) times daily. 22 g 0  . OVER THE COUNTER MEDICATION Take 1 tablet by mouth  at bedtime. "Leg cramps pill"    . polyethylene glycol (MIRALAX / GLYCOLAX) packet Take 17 g by mouth daily as needed for moderate constipation. 14 each 1  . senna (SENOKOT) 8.6 MG TABS tablet Take 2 tablets (17.2 mg total) by mouth daily. 60 each 0  . simvastatin (ZOCOR) 20 MG tablet Take 1 tablet (20 mg total) by mouth daily at 6 PM. 90 tablet 3  . simvastatin (ZOCOR) 20 MG tablet TAKE 1 TABLET BY MOUTH EVERY NIGHT AT BEDTIME 90 tablet 1   No current facility-administered medications on file prior to visit.    Current  Medications (verified) Current Outpatient Prescriptions  Medication Sig Dispense Refill  . albuterol (PROVENTIL HFA;VENTOLIN HFA) 108 (90 BASE) MCG/ACT inhaler Inhale 2 puffs into the lungs every 6 (six) hours as needed for wheezing or shortness of breath. 1 Inhaler 3  . ammonium lactate (AMLACTIN) 12 % cream APPLY TOPICALLY TO AFFECTED TWICE DAILY 385 g 3  . aspirin 81 MG chewable tablet Chew 81 mg by mouth at bedtime.    . bisacodyl (DULCOLAX) 10 MG suppository Place 1 suppository (10 mg total) rectally daily as needed for moderate constipation. 12 suppository 1  . Bismuth Tribromoph-Petrolatum (XEROFORM PETROLAT GAUZE 5"X9") MISC Apply 1 application topically every other day. 50 each 0  . Cyanocobalamin (B-12) 500 MCG TABS Take 500 mcg by mouth daily.     Marland Kitchen docusate sodium 100 MG CAPS Take 100 mg by mouth 2 (two) times daily. 30 capsule 0  . lisinopril (PRINIVIL,ZESTRIL) 10 MG tablet Take 1 tablet (10 mg total) by mouth daily. 90 tablet 2  . lisinopril (PRINIVIL,ZESTRIL) 10 MG tablet TAKE 1 TABLET BY MOUTH EVERY DAY 30 tablet 0  . Multiple Vitamin (MULTIVITAMIN) tablet Take 1 tablet by mouth daily.    . mupirocin ointment (BACTROBAN) 2 % Place 1 application into the nose 2 (two) times daily. 22 g 0  . OVER THE COUNTER MEDICATION Take 1 tablet by mouth at bedtime. "Leg cramps pill"    . polyethylene glycol (MIRALAX / GLYCOLAX) packet Take 17 g by mouth daily as needed for moderate constipation. 14 each 1  . senna (SENOKOT) 8.6 MG TABS tablet Take 2 tablets (17.2 mg total) by mouth daily. 60 each 0  . simvastatin (ZOCOR) 20 MG tablet Take 1 tablet (20 mg total) by mouth daily at 6 PM. 90 tablet 3  . simvastatin (ZOCOR) 20 MG tablet TAKE 1 TABLET BY MOUTH EVERY NIGHT AT BEDTIME 90 tablet 1   No current facility-administered medications for this visit.     Allergies (verified) Sulfa antibiotics   PAST HISTORY  Family History Family History  Problem Relation Age of Onset  . Cancer Father    . Heart attack Paternal Grandfather   . Kidney disease Paternal Aunt     Social History Social History  Substance Use Topics  . Smoking status: Never Smoker   . Smokeless tobacco: Never Used  . Alcohol Use: No    Are there smokers in your home (other than you)?  Yes  Risk Factors Current exercise habits: Home exercise routine includes walking 2 hrs per day.  Dietary issues discussed: eats generally healthy - eats veggies  Cardiac risk factors: advanced age (older than 70 for men, 36 for women), diabetes mellitus, dyslipidemia, hypertension, male gender, obesity (BMI >= 30 kg/m2), sedentary lifestyle and tobacco exposure.  Depression Screen (Note: if answer to either of the following is "Yes", a more complete depression screening is indicated)  Q1: Over the past two weeks, have you felt down, depressed or hopeless? No  Q2: Over the past two weeks, have you felt little interest or pleasure in doing things? No - he has started going to church on Sunday and on Bible study  Have you lost interest or pleasure in daily life? No  Do you often feel hopeless? No  Do you cry easily over simple problems? No  Activities of Daily Living In your present state of health, do you have any difficulty performing the following activities?:  Driving? Yes Managing money?  Yes Feeding yourself? No Getting from bed to chair? No Climbing a flight of stairs? Yes Preparing food and eating?: No Bathing or showering? Yes Getting dressed: No Getting to the toilet? No Using the toilet:No Moving around from place to place: No In the past year have you fallen or had a near fall?:Yes   Are you sexually active?  No  Do you have more than one partner?  No  Hearing Difficulties: Yes Do you often ask people to speak up or repeat themselves? Yes Do you experience ringing or noises in your ears? Yes Do you have difficulty understanding soft or whispered voices? Yes   Do you feel that you have a problem  with memory? No  Do you often misplace items? No  Do you feel safe at home?  Yes  Cognitive Testing  Alert? Yes  Normal Appearance?Yes  Oriented to person? Yes  Place? Yes   Time? Yes  Recall of three objects?  Yes  Can perform simple calculations? No  Displays appropriate judgment?Yes  Can read the correct time from a watch face?No   Advanced Directives have been discussed with the patient? Yes   List the Names of Other Physician/Practitioners you currently use: 1.   optho Dr. Harriette Bouillon   Indicate any recent Medical Services you may have received from other than [facility] providers in the past year (date may be approximate).  Immunization History  Administered Date(s) Administered  . Influenza,inj,Quad PF,36+ Mos 12/15/2012, 09/21/2013  . Pneumococcal Conjugate-13 01/18/2014  . Pneumococcal Polysaccharide-23 06/16/2012  . Td 01/31/2013    Screening Tests Health Maintenance  Topic Date Due  . Hepatitis C Screening  [birth year]  . COLONOSCOPY  05/02/2013  . INFLUENZA VACCINE  09/02/2014  . FOOT EXAM  09/22/2014  . OPHTHALMOLOGY EXAM  10/03/2014  . HEMOGLOBIN A1C  11/23/2014  . TETANUS/TDAP  02/01/2023  . ZOSTAVAX  Addressed  . PNA vac Low Risk Adult  Completed    All answers were reviewed with the patient and necessary referrals were made:  SHAW,EVA, MD   03/24/2015   History reviewed: allergies, current medications, past family history, past medical history, past social history, past surgical history and problem list  Review of Systems Pertinent items noted in HPI and remainder of comprehensive ROS otherwise negative.    Objective:  BP 129/87 mmHg  Pulse 90  Temp(Src) 97.4 F (36.3 C)  Resp 18  Ht  (1.651 m)  Wt 205 lb (92.987 kg)  BMI 34.11 kg/m2  SpO2 98%  Visual Acuity Screening   Right eye Left eye Both eyes  Without correction:  With correction:       BP 129/87 mmHg  Pulse 90  Temp(Src) 97.4 F (36.3 C)  Resp 18  Ht   (1.651 m)  Wt 205 lb (92.987 kg)  BMI 34.11 kg/m2  SpO2 98%  General Appearance:    Alert, cooperative, no  distress, appears stated age  Head:    Normocephalic, without obvious abnormality, atraumatic  Eyes:    PERRL, conjunctiva/corneas clear, EOM's intact, fundi    benign, both eyes       Ears:    Normal TM's and external ear canals, both ears  Nose:   Nares normal, septum midline, mucosa normal, no drainage    or sinus tenderness  Throat:   Lips, mucosa, and tongue normal; teeth and gums normal  Neck:   Supple, symmetrical, trachea midline, no adenopathy;       thyroid:  No enlargement/tenderness/nodules; no carotid   bruit or JVD  Back:     Symmetric, no curvature, ROM normal, no CVA tenderness  Lungs:     Clear to auscultation bilaterally, respirations unlabored  Chest wall:    No tenderness or deformity  Heart:    Regular rate and rhythm, S1 and S2 normal, no murmur, rub   or gallop  Abdomen:     Soft, non-tender, bowel sounds active all four quadrants,    no masses, no organomegaly  Genitalia:    Rectal:    Normal tone, normal prostate, no masses or tenderness  Extremities:   Extremities normal, atraumatic, no cyanosis or edema  Pulses:   2+ and symmetric all extremities  Skin:   Skin color, texture, turgor normal, no rashes or lesions  Lymph nodes:   Cervical, supraclavicular, and axillary nodes normal  Neurologic:   CNII-XII intact. Normal strength, sensation and reflexes      throughout       Assessment:     1. Medicare annual wellness visit, subsequent   2. Screening for cardiovascular, respiratory, and genitourinary diseases   3. Screening for colorectal cancer   4. Screening for deficiency anemia   5. Screening for prostate cancer   6. Screening for thyroid disorder   7. Routine screening for STI (sexually transmitted infection)   8. Essential hypertension, benign   9. Type 2 diabetes mellitus with peripheral circulatory disorder (HCC)   10. Type II diabetes  mellitus with peripheral autonomic neuropathy (HCC) - flu shot and foot exam done today; a1c increasing a little up to 7.2 today so discussed ways to work on diet and exercise; recheck in 4 mos and may want to resume metformin at that time if not improved  11. Polypharmacy   12. Hyperlipidemia LDL goal <100   13. Need for prophylactic vaccination and inoculation against influenza   14. Venous stasis ulcer, right (HCC) - unna boot applied to right leg, remove in 3-4d.  Pt's nephew can remove and has reapplied successfully on apts behalf many times prior when needed for acute blisters/ulceration.  Some erythema so cover with Keflex.        Plan:   During the course of the visit the patient was educated and counseled about appropriate screening and preventive services including:   Had inhaler from when he had childhood asthma and had flair when he was staying with Kathie Rhodes but no problems now.    Pneumococcal vaccine - both 13 and 23 valent done 2014 and 2015  Influenza vaccine - done today  Td vaccine - done 2014   s/p zostavax at 70 yo  Screening electrocardiogram - done 1 yr prior  Prostate cancer screening - done today  Colorectal cancer screening - done 05/2008 - repeat in 5 years which was not done so referred back to GI for this  Diabetes screening - a1c increased to 7.2 - work on  diet and exercise, recheck in 4 mos  Glaucoma screening - had cataract surg 11/2014, follows closely with optho Dr. Vonna Kotyk for surgery and also seeing Dr. Harriette Bouillon  Nutrition counseling   Advanced directives: has NO advanced directive  - add't info requested. Referral to SW: no  Gave pt handouts and forms today to complete a living will and a [facility] POA  Diet review for nutrition referral? Yes ____  Not Indicated ____   Patient Instructions (the written plan) was given to the patient.  Medicare Attestation I have personally reviewed: The patient's medical and social history Their use of alcohol,  tobacco or illicit drugs Their current medications and supplements The patient's functional ability including ADLs,fall risks, home safety risks, cognitive, and hearing and visual impairment Diet and physical activities Evidence for depression or mood disorders  The patient's weight, height, BMI, and visual acuity have been recorded in the chart.  I have made referrals, counseling, and provided education to the patient based on review of the above and I have provided the patient with a written personalized care plan for preventive services.     Norberto Sorenson, MD   03/24/2015    Orders Placed This Encounter  Procedures  . Flu Vaccine QUAD 36+ mos IM  . Comprehensive metabolic panel    Order Specific Question:  Has the patient fasted?    Answer:  Yes  . Lipid panel    Order Specific Question:  Has the patient fasted?    Answer:  Yes  . CBC  . PSA  . Hepatitis C Antibody  . Microalbumin/Creatinine Ratio, Urine  . Ambulatory referral to Gastroenterology    Referral Priority:  Routine    Referral Type:  Consultation    Referral Reason:  Specialty Services Required    Number of Visits Requested:  1  . POCT glycosylated hemoglobin (Hb A1C)  . POCT urinalysis dipstick    Meds ordered this encounter  Medications  . cephALEXin (KEFLEX) 500 MG capsule    Sig: Take 1 capsule (500 mg total) by mouth 3 (three) times daily.    Dispense:  42 capsule    Refill:  0  . lisinopril (PRINIVIL,ZESTRIL) 10 MG tablet    Sig: Take 1 tablet (10 mg total) by mouth daily.    Dispense:  90 tablet    Refill:  3    Results for orders placed or performed in visit on 03/25/15  Comprehensive metabolic panel  Result Value Ref Range   Sodium 140 135 - 146 mmol/L   Potassium 4.3 3.5 - 5.3 mmol/L   Chloride 103 98 - 110 mmol/L   CO2 26 20 - 31 mmol/L   Glucose, Bld 179 (H) 65 - 99 mg/dL   BUN 22 7 - 25 mg/dL   Creat 4.09 8.11 - 9.14 mg/dL   Total Bilirubin 1.0 0.2 - 1.2 mg/dL   Alkaline Phosphatase 55 40  - 115 U/L   AST 15 10 - 35 U/L   ALT 11 9 - 46 U/L   Total Protein 6.9 6.1 - 8.1 g/dL   Albumin 4.3 3.6 - 5.1 g/dL   Calcium 9.4 8.6 - 78.2 mg/dL  Lipid panel  Result Value Ref Range   Cholesterol 145 125 - 200 mg/dL   Triglycerides 956 (H) <150 mg/dL   HDL 43 >=21 mg/dL   Total CHOL/HDL Ratio 3.4 <=5.0 Ratio   VLDL 41 (H) <30 mg/dL   LDL Cholesterol 61 <308 mg/dL  CBC  Result Value Ref Range  WBC 8.1 4.0 - 10.5 K/uL   RBC 4.76 4.22 - 5.81 MIL/uL   Hemoglobin 13.6 13.0 - 17.0 g/dL   HCT 51.8 84.1 - 66.0 %   MCV 86.3 78.0 - 100.0 fL   MCH 28.6 26.0 - 34.0 pg   MCHC 33.1 30.0 - 36.0 g/dL   RDW 63.0 16.0 - 10.9 %   Platelets 265 150 - 400 K/uL   MPV 9.6 8.6 - 12.4 fL  PSA  Result Value Ref Range   PSA 1.67 <=4.00 ng/mL  Hepatitis C Antibody  Result Value Ref Range   HCV Ab NEGATIVE NEGATIVE  Microalbumin/Creatinine Ratio, Urine  Result Value Ref Range   Creatinine, Urine 119 20 - 370 mg/dL   Microalb, Ur 1.2 Not estab mg/dL   Microalb Creat Ratio 10 <30 mcg/mg creat  POCT glycosylated hemoglobin (Hb A1C)  Result Value Ref Range   Hemoglobin A1C 7.2   POCT urinalysis dipstick  Result Value Ref Range   Color, UA yellow yellow   Clarity, UA clear clear   Glucose, UA negative negative   Bilirubin, UA negative negative   Ketones, POC UA negative negative   Spec Grav, UA 1.020    Blood, UA trace-lysed (A) negative   pH, UA 5.5    Protein Ur, POC negative negative   Urobilinogen, UA 0.2    Nitrite, UA Negative Negative   Leukocytes, UA Negative Negative    Norberto Sorenson, MD MPH

## 2015-03-25 ENCOUNTER — Ambulatory Visit (INDEPENDENT_AMBULATORY_CARE_PROVIDER_SITE_OTHER): Payer: Medicare Other | Admitting: Family Medicine

## 2015-03-25 ENCOUNTER — Encounter: Payer: Self-pay | Admitting: Family Medicine

## 2015-03-25 VITALS — BP 129/87 | HR 90 | Temp 97.4°F | Resp 18 | Ht 65.0 in | Wt 205.0 lb

## 2015-03-25 DIAGNOSIS — Z130 Encounter for screening for diseases of the blood and blood-forming organs and certain disorders involving the immune mechanism: Secondary | ICD-10-CM

## 2015-03-25 DIAGNOSIS — Z23 Encounter for immunization: Secondary | ICD-10-CM

## 2015-03-25 DIAGNOSIS — Z1329 Encounter for screening for other suspected endocrine disorder: Secondary | ICD-10-CM | POA: Diagnosis not present

## 2015-03-25 DIAGNOSIS — Z0000 Encounter for general adult medical examination without abnormal findings: Secondary | ICD-10-CM

## 2015-03-25 DIAGNOSIS — Z1389 Encounter for screening for other disorder: Secondary | ICD-10-CM | POA: Diagnosis not present

## 2015-03-25 DIAGNOSIS — Z1383 Encounter for screening for respiratory disorder NEC: Secondary | ICD-10-CM

## 2015-03-25 DIAGNOSIS — Z1211 Encounter for screening for malignant neoplasm of colon: Secondary | ICD-10-CM | POA: Diagnosis not present

## 2015-03-25 DIAGNOSIS — Z125 Encounter for screening for malignant neoplasm of prostate: Secondary | ICD-10-CM | POA: Diagnosis not present

## 2015-03-25 DIAGNOSIS — E1151 Type 2 diabetes mellitus with diabetic peripheral angiopathy without gangrene: Secondary | ICD-10-CM | POA: Diagnosis not present

## 2015-03-25 DIAGNOSIS — Z1212 Encounter for screening for malignant neoplasm of rectum: Secondary | ICD-10-CM

## 2015-03-25 DIAGNOSIS — IMO0001 Reserved for inherently not codable concepts without codable children: Secondary | ICD-10-CM

## 2015-03-25 DIAGNOSIS — Z136 Encounter for screening for cardiovascular disorders: Secondary | ICD-10-CM

## 2015-03-25 DIAGNOSIS — I10 Essential (primary) hypertension: Secondary | ICD-10-CM | POA: Diagnosis not present

## 2015-03-25 DIAGNOSIS — I83019 Varicose veins of right lower extremity with ulcer of unspecified site: Secondary | ICD-10-CM | POA: Diagnosis not present

## 2015-03-25 DIAGNOSIS — E785 Hyperlipidemia, unspecified: Secondary | ICD-10-CM | POA: Diagnosis not present

## 2015-03-25 DIAGNOSIS — E1143 Type 2 diabetes mellitus with diabetic autonomic (poly)neuropathy: Secondary | ICD-10-CM

## 2015-03-25 DIAGNOSIS — Z113 Encounter for screening for infections with a predominantly sexual mode of transmission: Secondary | ICD-10-CM | POA: Diagnosis not present

## 2015-03-25 DIAGNOSIS — Z79899 Other long term (current) drug therapy: Secondary | ICD-10-CM | POA: Diagnosis not present

## 2015-03-25 LAB — CBC
HEMATOCRIT: 41.1 % (ref 39.0–52.0)
Hemoglobin: 13.6 g/dL (ref 13.0–17.0)
MCH: 28.6 pg (ref 26.0–34.0)
MCHC: 33.1 g/dL (ref 30.0–36.0)
MCV: 86.3 fL (ref 78.0–100.0)
MPV: 9.6 fL (ref 8.6–12.4)
PLATELETS: 265 10*3/uL (ref 150–400)
RBC: 4.76 MIL/uL (ref 4.22–5.81)
RDW: 13.4 % (ref 11.5–15.5)
WBC: 8.1 10*3/uL (ref 4.0–10.5)

## 2015-03-25 LAB — POCT URINALYSIS DIP (MANUAL ENTRY)
BILIRUBIN UA: NEGATIVE
BILIRUBIN UA: NEGATIVE
GLUCOSE UA: NEGATIVE
Leukocytes, UA: NEGATIVE
NITRITE UA: NEGATIVE
PH UA: 5.5
Protein Ur, POC: NEGATIVE
SPEC GRAV UA: 1.02
UROBILINOGEN UA: 0.2

## 2015-03-25 LAB — COMPREHENSIVE METABOLIC PANEL
ALBUMIN: 4.3 g/dL (ref 3.6–5.1)
ALT: 11 U/L (ref 9–46)
AST: 15 U/L (ref 10–35)
Alkaline Phosphatase: 55 U/L (ref 40–115)
BUN: 22 mg/dL (ref 7–25)
CALCIUM: 9.4 mg/dL (ref 8.6–10.3)
CO2: 26 mmol/L (ref 20–31)
CREATININE: 0.99 mg/dL (ref 0.70–1.25)
Chloride: 103 mmol/L (ref 98–110)
Glucose, Bld: 179 mg/dL — ABNORMAL HIGH (ref 65–99)
POTASSIUM: 4.3 mmol/L (ref 3.5–5.3)
Sodium: 140 mmol/L (ref 135–146)
Total Bilirubin: 1 mg/dL (ref 0.2–1.2)
Total Protein: 6.9 g/dL (ref 6.1–8.1)

## 2015-03-25 LAB — LIPID PANEL
CHOLESTEROL: 145 mg/dL (ref 125–200)
HDL: 43 mg/dL (ref >=40)
LDL Cholesterol: 61 mg/dL (ref <130)
Total CHOL/HDL Ratio: 3.4 Ratio (ref <=5.0)
Triglycerides: 206 mg/dL — ABNORMAL HIGH (ref <150)
VLDL: 41 mg/dL — AB (ref <30)

## 2015-03-25 LAB — POCT GLYCOSYLATED HEMOGLOBIN (HGB A1C): Hemoglobin A1C: 7.2

## 2015-03-25 LAB — HEPATITIS C ANTIBODY: HCV Ab: NEGATIVE

## 2015-03-25 MED ORDER — LISINOPRIL 10 MG PO TABS: 10.0000 mg | ORAL_TABLET | Freq: Every day | ORAL | Status: DC

## 2015-03-25 MED ORDER — CEPHALEXIN 500 MG PO CAPS: 500.0000 mg | ORAL_CAPSULE | Freq: Three times a day (TID) | ORAL | Status: DC

## 2015-03-25 NOTE — Patient Instructions (Signed)
Health Maintenance, Male A healthy lifestyle and preventative care can promote health and wellness.  Maintain regular health, dental, and eye exams.  Eat a healthy diet. Foods like vegetables, fruits, whole grains, low-fat dairy products, and lean protein foods contain the nutrients you need and are low in calories. Decrease your intake of foods high in solid fats, added sugars, and salt. Get information about a proper diet from your health care provider, if necessary.  Regular physical exercise is one of the most important things you can do for your health. Most adults should get at least 150 minutes of moderate-intensity exercise (any activity that increases your heart rate and causes you to sweat) each week. In addition, most adults need muscle-strengthening exercises on 2 or more days a week.   Maintain a healthy weight. The body mass index (BMI) is a screening tool to identify possible weight problems. It provides an estimate of body fat based on height and weight. Your health care provider can find your BMI and can help you achieve or maintain a healthy weight. For males 20 years and older:  A BMI below 18.5 is considered underweight.  A BMI of 18.5 to 24.9 is normal.  A BMI of 25 to 29.9 is considered overweight.  A BMI of 30 and above is considered obese.  Maintain normal blood lipids and cholesterol by exercising and minimizing your intake of saturated fat. Eat a balanced diet with plenty of fruits and vegetables. Blood tests for lipids and cholesterol should begin at age 24 and be repeated every 5 years. If your lipid or cholesterol levels are high, you are over age 36, or you are at high risk for heart disease, you may need your cholesterol levels checked more frequently.Ongoing high lipid and cholesterol levels should be treated with medicines if diet and exercise are not working.  If you smoke, find out from your health care provider how to quit. If you do not use tobacco, do  not start.  Lung cancer screening is recommended for adults aged 39-80 years who are at high risk for developing lung cancer because of a history of smoking. A yearly low-dose CT scan of the lungs is recommended for people who have at least a 30-pack-year history of smoking and are current smokers or have quit within the past 15 years. A pack year of smoking is smoking an average of 1 pack of cigarettes a day for 1 year (for example, a 30-pack-year history of smoking could mean smoking 1 pack a day for 30 years or 2 packs a day for 15 years). Yearly screening should continue until the smoker has stopped smoking for at least 15 years. Yearly screening should be stopped for people who develop a health problem that would prevent them from having lung cancer treatment.  If you choose to drink alcohol, do not have more than 2 drinks per day. One drink is considered to be 12 oz (360 mL) of beer, 5 oz (150 mL) of wine, or 1.5 oz (45 mL) of liquor.  Avoid the use of street drugs. Do not share needles with anyone. Ask for help if you need support or instructions about stopping the use of drugs.  High blood pressure causes heart disease and increases the risk of stroke. High blood pressure is more likely to develop in:  People who have blood pressure in the end of the normal range (100-139/85-89 mm Hg).  People who are overweight or obese.  People who are African American.  If you are 74-6 years of age, have your blood pressure checked every 3-5 years. If you are 30 years of age or older, have your blood pressure checked every year. You should have your blood pressure measured twice--once when you are at a hospital or clinic, and once when you are not at a hospital or clinic. Record the average of the two measurements. To check your blood pressure when you are not at a hospital or clinic, you can use:  An automated blood pressure machine at a pharmacy.  A home blood pressure monitor.  If you are 98-66  years old, ask your health care provider if you should take aspirin to prevent heart disease.  Diabetes screening involves taking a blood sample to check your fasting blood sugar level. This should be done once every 3 years after age 20 if you are at a normal weight and without risk factors for diabetes. Testing should be considered at a younger age or be carried out more frequently if you are overweight and have at least 1 risk factor for diabetes.  Colorectal cancer can be detected and often prevented. Most routine colorectal cancer screening begins at the age of 53 and continues through age 10. However, your health care provider may recommend screening at an earlier age if you have risk factors for colon cancer. On a yearly basis, your health care provider may provide home test kits to check for hidden blood in the stool. A small camera at the end of a tube may be used to directly examine the colon (sigmoidoscopy or colonoscopy) to detect the earliest forms of colorectal cancer. Talk to your health care provider about this at age 36 when routine screening begins. A direct exam of the colon should be repeated every 5-10 years through age 22, unless early forms of precancerous polyps or small growths are found.  People who are at an increased risk for hepatitis B should be screened for this virus. You are considered at high risk for hepatitis B if:  You were born in a country where hepatitis B occurs often. Talk with your health care provider about which countries are considered high risk.  Your parents were born in a high-risk country and you have not received a shot to protect against hepatitis B (hepatitis B vaccine).  You have HIV or AIDS.  You use needles to inject street drugs.  You live with, or have sex with, someone who has hepatitis B.  You are a man who has sex with other men (MSM).  You get hemodialysis treatment.  You take certain medicines for conditions like cancer, organ  transplantation, and autoimmune conditions.  Hepatitis C blood testing is recommended for all people born from [birth year] through 1965 and any individual with known risk factors for hepatitis C.  Healthy men should no longer receive prostate-specific antigen (PSA) blood tests as part of routine cancer screening. Talk to your health care provider about prostate cancer screening.  Testicular cancer screening is not recommended for adolescents or adult males who have no symptoms. Screening includes self-exam, a health care provider exam, and other screening tests. Consult with your health care provider about any symptoms you have or any concerns you have about testicular cancer.  Practice safe sex. Use condoms and avoid high-risk sexual practices to reduce the spread of sexually transmitted infections (STIs).  You should be screened for STIs, including gonorrhea and chlamydia if:  You are sexually active and are younger than 24 years.  You  are older than 24 years, and your health care provider tells you that you are at risk for this type of infection.  Your sexual activity has changed since you were last screened, and you are at an increased risk for chlamydia or gonorrhea. Ask your health care provider if you are at risk.  If you are at risk of being infected with HIV, it is recommended that you take a prescription medicine daily to prevent HIV infection. This is called pre-exposure prophylaxis (PrEP). You are considered at risk if:  You are a man who has sex with other men (MSM).  You are a heterosexual man who is sexually active with multiple partners.  You take drugs by injection.  You are sexually active with a partner who has HIV.  Talk with your health care provider about whether you are at high risk of being infected with HIV. If you choose to begin PrEP, you should first be tested for HIV. You should then be tested every 3 months for as long as you are taking PrEP.  Use sunscreen. Apply  sunscreen liberally and repeatedly throughout the day. You should seek shade when your shadow is shorter than you. Protect yourself by wearing long sleeves, pants, a wide-brimmed hat, and sunglasses year round whenever you are outdoors.  Tell your health care provider of new moles or changes in moles, especially if there is a change in shape or color. Also, tell your health care provider if a mole is larger than the size of a pencil eraser.  A one-time screening for abdominal aortic aneurysm (AAA) and surgical repair of large AAAs by ultrasound is recommended for men aged 65-75 years who are current or former smokers.  Stay current with your vaccines (immunizations).   This information is not intended to replace advice given to you by your health care provider. Make sure you discuss any questions you have with your health care provider.   Document Released: 07/17/2007 Document Revised: 02/08/2014 Document Reviewed: 06/15/2010 Elsevier Interactive Patient Education 2016 [employer].  Diabetes Mellitus and Food It is important for you to manage your blood sugar (glucose) level. Your blood glucose level can be greatly affected by what you eat. Eating healthier foods in the appropriate amounts throughout the day at about the same time each day will help you control your blood glucose level. It can also help slow or prevent worsening of your diabetes mellitus. Healthy eating may even help you improve the level of your blood pressure and reach or maintain a healthy weight.  General recommendations for healthful eating and cooking habits include:  Eating meals and snacks regularly. Avoid going long periods of time without eating to lose weight.  Eating a diet that consists mainly of plant-based foods, such as fruits, vegetables, nuts, legumes, and whole grains.  Using low-heat cooking methods, such as baking, instead of high-heat cooking methods, such as deep frying. Work with your dietitian to make  sure you understand how to use the Nutrition Facts information on food labels. HOW CAN FOOD AFFECT ME? Carbohydrates Carbohydrates affect your blood glucose level more than any other type of food. Your dietitian will help you determine how many carbohydrates to eat at each meal and teach you how to count carbohydrates. Counting carbohydrates is important to keep your blood glucose at a healthy level, especially if you are using insulin or taking certain medicines for diabetes mellitus. Alcohol Alcohol can cause sudden decreases in blood glucose (hypoglycemia), especially if you use insulin or  take certain medicines for diabetes mellitus. Hypoglycemia can be a life-threatening condition. Symptoms of hypoglycemia (sleepiness, dizziness, and disorientation) are similar to symptoms of having too much alcohol.  If your health care provider has given you approval to drink alcohol, do so in moderation and use the following guidelines:  Women should not have more than one drink per day, and men should not have more than two drinks per day. One drink is equal to:  12 oz of beer.  5 oz of wine.  1 oz of hard liquor.  Do not drink on an empty stomach.  Keep yourself hydrated. Have water, diet soda, or unsweetened iced tea.  Regular soda, juice, and other mixers might contain a lot of carbohydrates and should be counted. WHAT FOODS ARE NOT RECOMMENDED? As you make food choices, it is important to remember that all foods are not the same. Some foods have fewer nutrients per serving than other foods, even though they might have the same number of calories or carbohydrates. It is difficult to get your body what it needs when you eat foods with fewer nutrients. Examples of foods that you should avoid that are high in calories and carbohydrates but low in nutrients include:  Trans fats (most processed foods list trans fats on the Nutrition Facts label).  Regular soda.  Juice.  Candy.  Sweets, such as  cake, pie, doughnuts, and cookies.  Fried foods. WHAT FOODS CAN I EAT? Eat nutrient-rich foods, which will nourish your body and keep you healthy. The food you should eat also will depend on several factors, including:  The calories you need.  The medicines you take.  Your weight.  Your blood glucose level.  Your blood pressure level.  Your cholesterol level. You should eat a variety of foods, including:  Protein.  Lean cuts of meat.  Proteins low in saturated fats, such as fish, egg whites, and beans. Avoid processed meats.  Fruits and vegetables.  Fruits and vegetables that may help control blood glucose levels, such as apples, mangoes, and yams.  Dairy products.  Choose fat-free or low-fat dairy products, such as milk, yogurt, and cheese.  Grains, bread, pasta, and rice.  Choose whole grain products, such as multigrain bread, whole oats, and brown rice. These foods may help control blood pressure.  Fats.  Foods containing healthful fats, such as nuts, avocado, olive oil, canola oil, and fish. DOES EVERYONE WITH DIABETES MELLITUS HAVE THE SAME MEAL PLAN? Because every person with diabetes mellitus is different, there is not one meal plan that works for everyone. It is very important that you meet with a dietitian who will help you create a meal plan that is just right for you.   This information is not intended to replace advice given to you by your health care provider. Make sure you discuss any questions you have with your health care provider.   Document Released: 10/15/2004 Document Revised: 02/08/2014 Document Reviewed: 12/15/2012 Elsevier Interactive Patient Education 2016 [employer].  Tips for Eating Away From Home If You Have Diabetes Controlling your level of blood glucose, also known as blood sugar, can be challenging. It can be even more difficult when you do not prepare your own meals. The following tips can help you manage your diabetes when you eat  away from home. PLANNING AHEAD Plan ahead if you know you will be eating away from home:  Ask your health care provider how to time meals and medicine if you are taking insulin.  Make a list of restaurants near you that offer healthy choices. If they have a carry-out menu, take it home and plan what you will order ahead of time.  Look up the restaurant you want to eat at online. Many chain and fast-food restaurants list nutritional information online. Use this information to choose the healthiest options and to calculate how many carbohydrates will be in your meal.  Use a carbohydrate-counting book or mobile app to look up the carbohydrate content and serving size of the foods you want to eat.  Become familiar with serving sizes and learn to recognize how many servings are in a portion. This will allow you to estimate how many carbohydrates you can eat. FREE FOODS A "free food" is any food or drink that has less than 5 g of carbohydrates per serving. Free foods include:  Many vegetables.  Hard boiled eggs.  Nuts or seeds.  Olives.  Cheeses.  Meats. These types of foods make good appetizer choices and are often available at salad bars. Lemon juice, vinegar, or a low-calorie salad dressing of fewer than 20 calories per serving can be used as a "free" salad dressing.  CHOICES TO REDUCE CARBOHYDRATES  Substitute nonfat sweetened yogurt with a sugar-free yogurt. Yogurt made from soy milk may also be used, but you will still want a sugar-free or plain option to choose a lower carbohydrate amount.  Ask your server to take away the bread basket or chips from your table.  Order fresh fruit. A salad bar often offers fresh fruit choices. Avoid canned fruit because it is usually packed in sugar or syrup.  Order a salad, and eat it without dressing. Or, create a "free" salad dressing.  Ask for substitutions. For example, instead of [place] fries, request an order of a vegetable such as salad,  green beans, or broccoli. OTHER TIPS   If you take insulin, take the insulin once your food arrives to your table. This will ensure your insulin and food are timed correctly.  Ask your server about the portion size before your order, and ask for a take-out box if the portion has more servings than you should have. When your food comes, leave the amount you should have on the plate, and put the rest in the take-out box.  Consider splitting an entree with someone and ordering a side salad.   This information is not intended to replace advice given to you by your health care provider. Make sure you discuss any questions you have with your health care provider.   Document Released: 01/18/2005 Document Revised: 10/09/2014 Document Reviewed: 04/17/2013 Elsevier Interactive Patient Education 2016 [employer].  Nicholas Hardin , Thank you for taking time to come for your Medicare Wellness Visit. I appreciate your ongoing commitment to your health goals. Please review the following plan we discussed and let me know if I can assist you in the future.   These are the goals we discussed: Goals    None      This is a list of the screening recommended for you and due dates:  Health Maintenance  Topic Date Due  .  Hepatitis C: One time screening is recommended by Center for Disease Control  (CDC) for  adults born from [birth year] through 1963.   [birth year]  . Colon Cancer Screening  05/02/2013  . Eye exam for diabetics  10/03/2014  . Hemoglobin A1C  11/23/2014  . Flu Shot  09/02/2015  . Complete foot exam   03/24/2016  .  Tetanus Vaccine  02/01/2023  . Shingles Vaccine  Addressed  . Pneumonia vaccines  Completed

## 2015-03-26 ENCOUNTER — Encounter: Payer: Self-pay | Admitting: Family Medicine

## 2015-03-26 LAB — MICROALBUMIN / CREATININE URINE RATIO
Creatinine, Urine: 119 mg/dL (ref 20–370)
Microalb Creat Ratio: 10 mcg/mg creat (ref <30)
Microalb, Ur: 1.2 mg/dL

## 2015-03-26 LAB — PSA: PSA: 1.67 ng/mL (ref <=4.00)

## 2015-04-10 ENCOUNTER — Other Ambulatory Visit: Payer: Self-pay | Admitting: Family Medicine

## 2015-04-23 ENCOUNTER — Encounter: Payer: Self-pay | Admitting: Family Medicine

## 2015-04-29 ENCOUNTER — Ambulatory Visit (INDEPENDENT_AMBULATORY_CARE_PROVIDER_SITE_OTHER): Payer: Medicare Other | Admitting: Podiatry

## 2015-04-29 ENCOUNTER — Encounter: Payer: Self-pay | Admitting: Podiatry

## 2015-04-29 DIAGNOSIS — M79676 Pain in unspecified toe(s): Secondary | ICD-10-CM

## 2015-04-29 DIAGNOSIS — E1140 Type 2 diabetes mellitus with diabetic neuropathy, unspecified: Secondary | ICD-10-CM | POA: Diagnosis not present

## 2015-04-29 DIAGNOSIS — B351 Tinea unguium: Secondary | ICD-10-CM

## 2015-04-29 NOTE — Progress Notes (Signed)
Patient ID: Nicholas Hardin, male   DOB: 20-Jan-1946, 70 y.o.   MRN: [id number]  Subjective: This patient presents again complaining of toenails. His cousin is present in the treatment room today. Patient advises us that he has discontinued medication for diabetes and some blood pressure medicine from the visit of a 04/22/2014  Objective: The toenails are hypertrophic, elongated, discolored and tender to palpation 6-10 No open skin lesions bilaterally Hammertoe 1-5 bilaterally  Assessment: Symptomatic onychomycoses 6-10 History of diabetes  Plan: Debridement of toenails 10 mechanically and electrically without any Bleeding  Reappoint at three-month intervals

## 2015-04-29 NOTE — Patient Instructions (Signed)
Diabetes and Foot Care Diabetes may cause you to have problems because of poor blood supply (circulation) to your feet and legs. This may cause the skin on your feet to become thinner, break easier, and heal more slowly. Your skin may become dry, and the skin may peel and crack. You may also have nerve damage in your legs and feet causing decreased feeling in them. You may not notice minor injuries to your feet that could lead to infections or more serious problems. Taking care of your feet is one of the most important things you can do for yourself.  HOME CARE INSTRUCTIONS  Wear shoes at all times, even in the house. Do not go barefoot. Bare feet are easily injured.  Check your feet daily for blisters, cuts, and redness. If you cannot see the bottom of your feet, use a mirror or ask someone for help.  Wash your feet with warm water (do not use hot water) and mild soap. Then pat your feet and the areas between your toes until they are completely dry. Do not soak your feet as this can dry your skin.  Apply a moisturizing lotion or petroleum jelly (that does not contain alcohol and is unscented) to the skin on your feet and to dry, brittle toenails. Do not apply lotion between your toes.  Trim your toenails straight across. Do not dig under them or around the cuticle. File the edges of your nails with an emery board or nail file.  Do not cut corns or calluses or try to remove them with medicine.  Wear clean socks or stockings every day. Make sure they are not too tight. Do not wear knee-high stockings since they may decrease blood flow to your legs.  Wear shoes that fit properly and have enough cushioning. To break in new shoes, wear them for just a few hours a day. This prevents you from injuring your feet. Always look in your shoes before you put them on to be sure there are no objects inside.  Do not cross your legs. This may decrease the blood flow to your feet.  If you find a minor scrape,  cut, or break in the skin on your feet, keep it and the skin around it clean and dry. These areas may be cleansed with mild soap and water. Do not cleanse the area with peroxide, alcohol, or iodine.  When you remove an adhesive bandage, be sure not to damage the skin around it.  If you have a wound, look at it several times a day to make sure it is healing.  Do not use heating pads or hot water bottles. They may burn your skin. If you have lost feeling in your feet or legs, you may not know it is happening until it is too late.  Make sure your health care provider performs a complete foot exam at least annually or more often if you have foot problems. Report any cuts, sores, or bruises to your health care provider immediately. SEEK MEDICAL CARE IF:   You have an injury that is not healing.  You have cuts or breaks in the skin.  You have an ingrown nail.  You notice redness on your legs or feet.  You feel burning or tingling in your legs or feet.  You have pain or cramps in your legs and feet.  Your legs or feet are numb.  Your feet always feel cold. SEEK IMMEDIATE MEDICAL CARE IF:   There is increasing redness,   swelling, or pain in or around a wound.  There is a red line that goes up your leg.  Pus is coming from a wound.  You develop a fever or as directed by your health care provider.  You notice a bad smell coming from an ulcer or wound.   This information is not intended to replace advice given to you by your health care provider. Make sure you discuss any questions you have with your health care provider.   Document Released: 01/16/2000 Document Revised: 09/20/2012 Document Reviewed: 06/27/2012 Elsevier Interactive Patient Education 2016 [employer] Inc[employer]  

## 2015-05-08 ENCOUNTER — Other Ambulatory Visit: Payer: Self-pay | Admitting: Family Medicine

## 2015-06-19 ENCOUNTER — Telehealth: Payer: Self-pay

## 2015-06-19 NOTE — Telephone Encounter (Signed)
Breat advise. FYI

## 2015-06-19 NOTE — Telephone Encounter (Signed)
Toniann FailWendy from [facility] is calling stating that pt is having tachacardaya and irregular heart rate- I recommended pt to come in and be evaluated

## 2015-06-20 NOTE — Telephone Encounter (Addendum)
Yes, pt needs to be seen today!!!   LVM on home phone that needs to be seen today

## 2015-07-08 IMAGING — CT CT ABD-PELV W/ CM
2 of 6 series · 17 of 46 positions shown, 19 images · IV contrast (omnipaque)
Comparison: None.

CLINICAL DATA: Vomiting

EXAM:
CT ABDOMEN AND PELVIS WITH CONTRAST
TECHNIQUE: Multidetector CT imaging of the abdomen and pelvis was performed
using the standard protocol following bolus administration of
intravenous contrast.
CONTRAST:  100mL OMNIPAQUE IOHEXOL 300 MG/ML  SOLN

[Series 2: abd/pel with · axial · 0.86mm/px · z∈[+1000,+1380]mm · 14 of 88 slices shown, 16 images]
[im 6/88  soft-tissue]
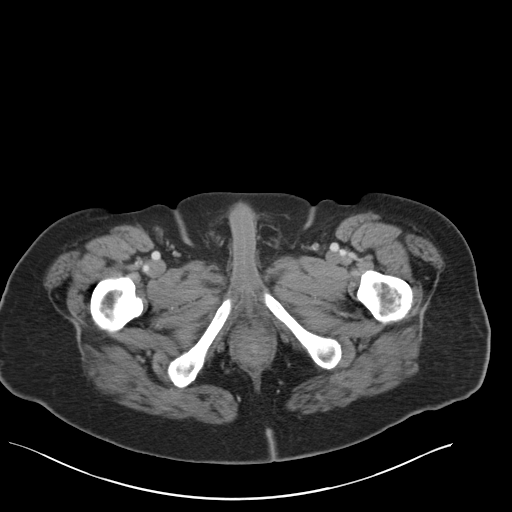
[im 6/88  bone]
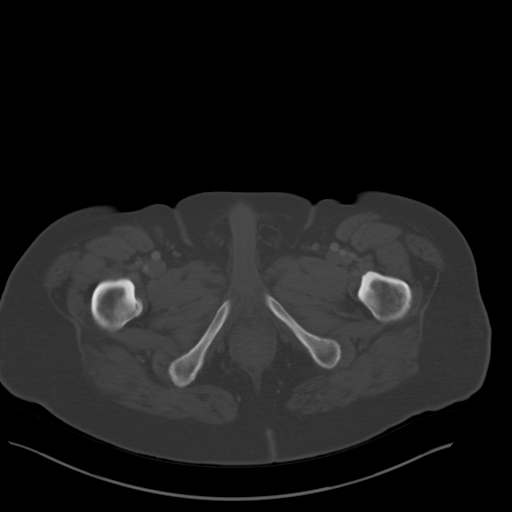
[im 11/88  soft-tissue]
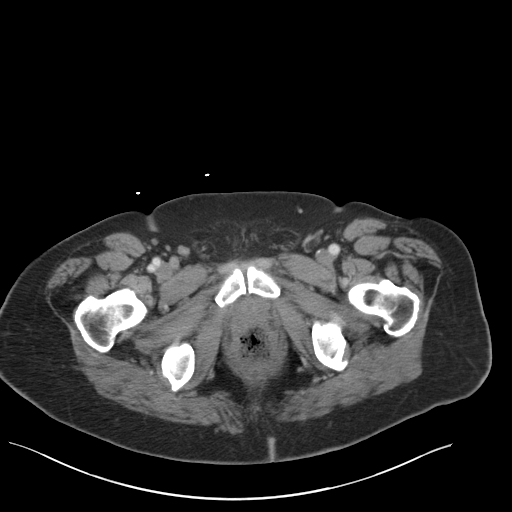
[im 17/88  soft-tissue]
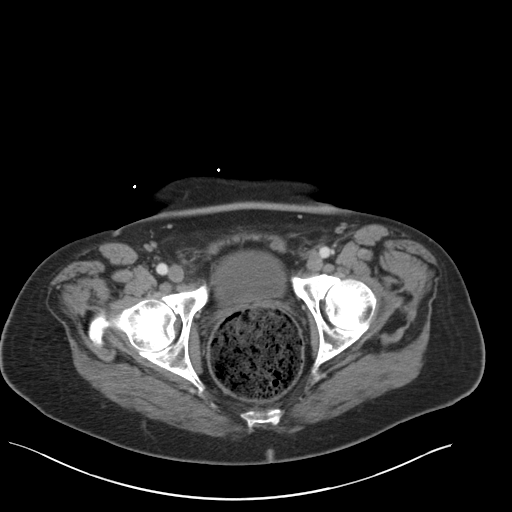
[im 22/88  soft-tissue]
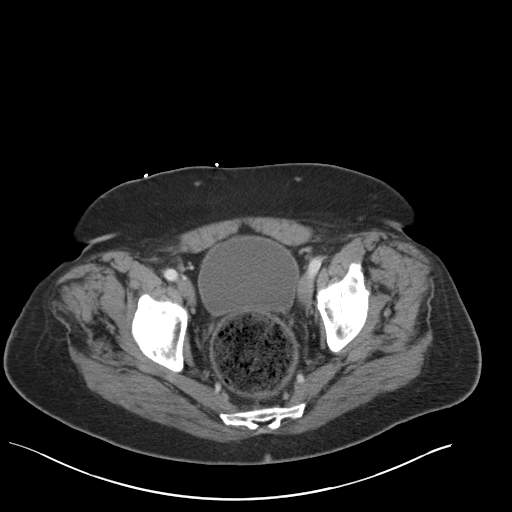
[im 28/88  soft-tissue]
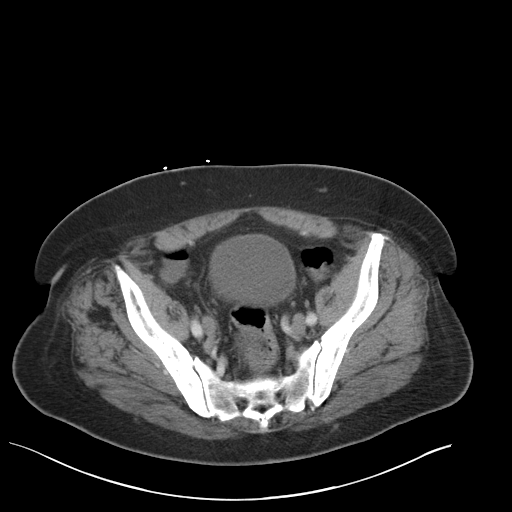
[im 33/88  soft-tissue]
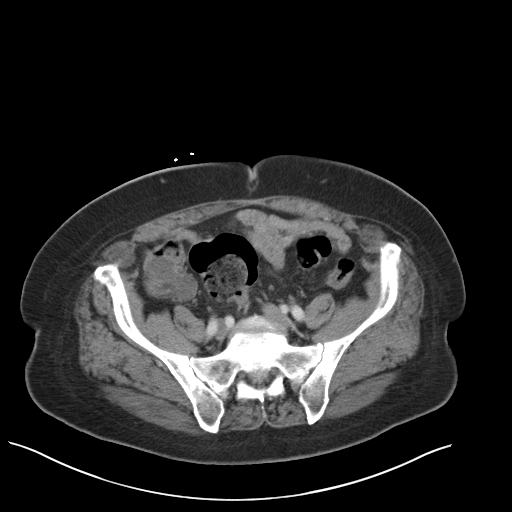
[im 39/88  soft-tissue]
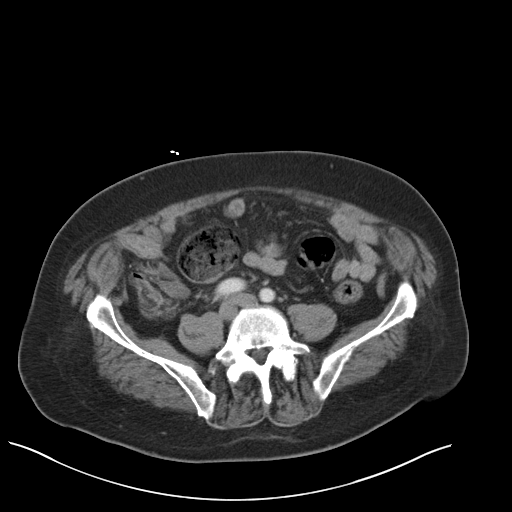
[im 49/88  soft-tissue]
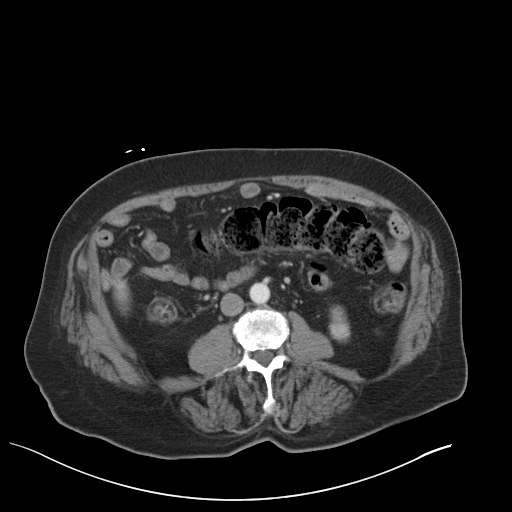
[im 55/88  soft-tissue]
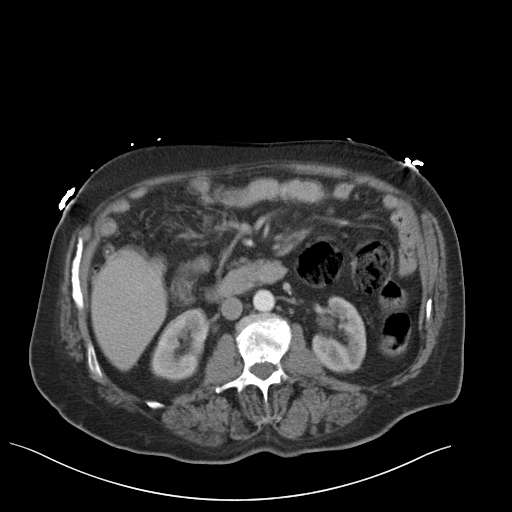
[im 55/88  bone]
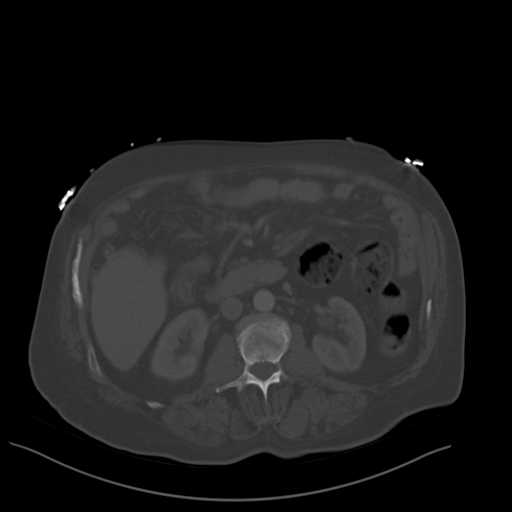
[im 60/88  soft-tissue]
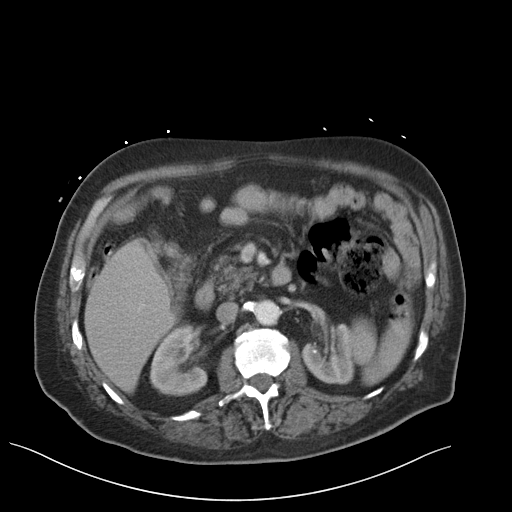
[im 66/88  soft-tissue]
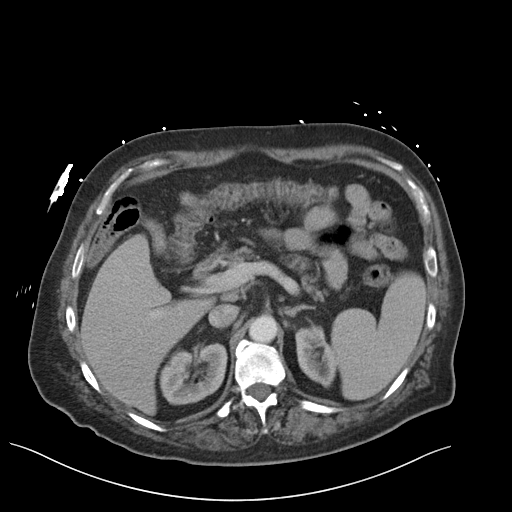
[im 71/88  soft-tissue]
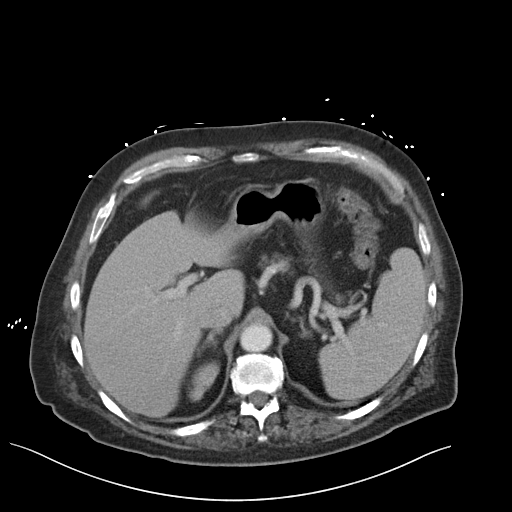
[im 77/88  soft-tissue]
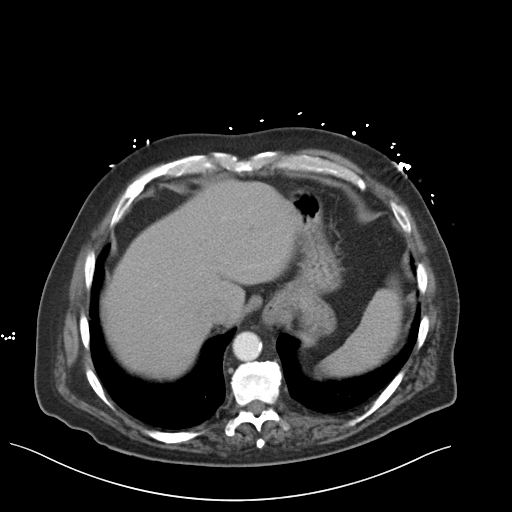
[im 82/88  soft-tissue]
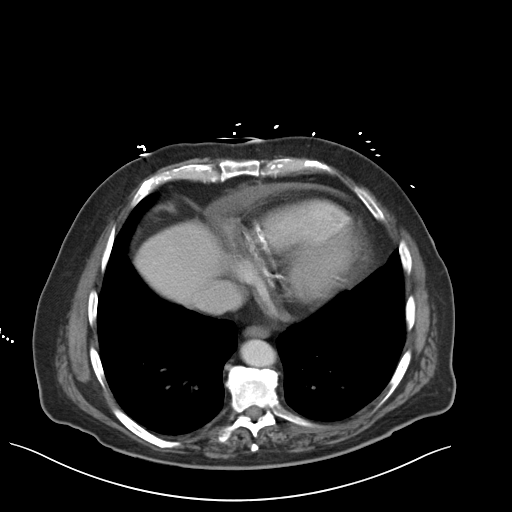

[Series 5: coronal a/|p · coronal · 0.79mm/px · 3 of 104 slices shown]
[im 35/104  soft-tissue]
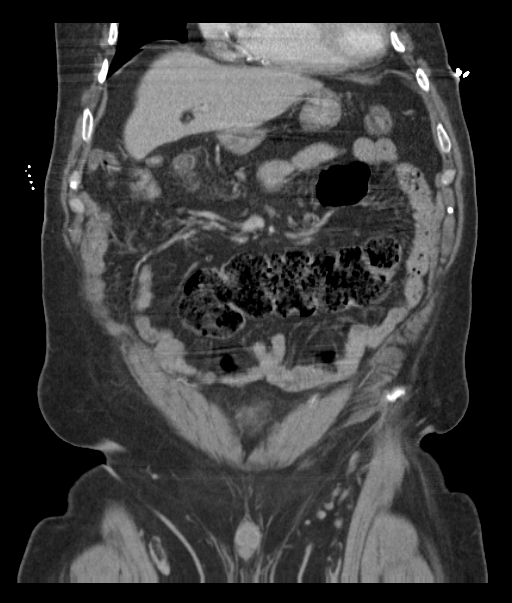
[im 46/104  soft-tissue]
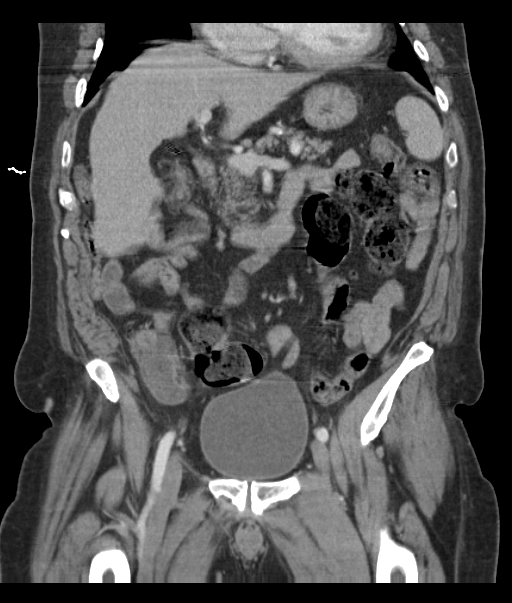
[im 58/104  soft-tissue]
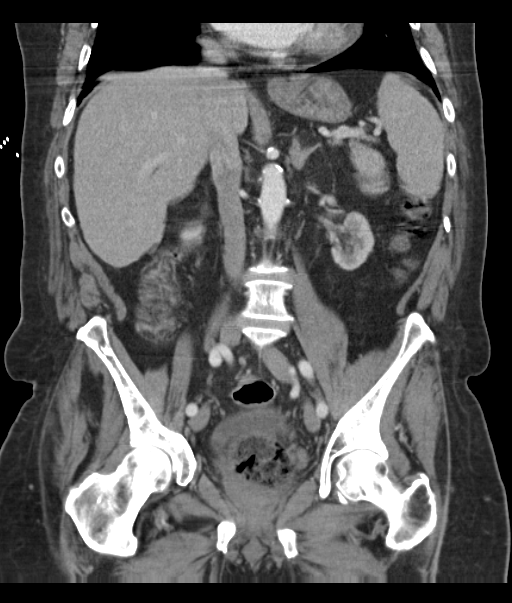

[17 of 46 positions shown; findings below may reference images not displayed]

FINDINGS: Lung bases are clear. Mild cardiac enlargement. Mild pericardial
thickening.

Liver and spleen are normal. Gallbladder surgically absent. Pancreas
is normal.

No renal obstruction or mass. Nonobstructing 3 x 4 mm left renal
calculus. 10 mm right renal cyst. Ureters are nondilated. Urinary
bladder is normal. Prostate not enlarged.

Constipation with stool throughout the colon. Rectal impaction.
Negative for bowel obstruction or thickening. No free fluid.
IMPRESSION: Constipation with rectal impaction. Negative for bowel obstruction.
Otherwise no acute abnormality.

## 2015-07-08 IMAGING — CR DG CHEST 2V
2 series · 2 of 2 positions shown · non-contrast
Comparison: 08/30/2012.

CLINICAL DATA: Vomiting.

EXAM:
CHEST  2 VIEW

[w chest lat]
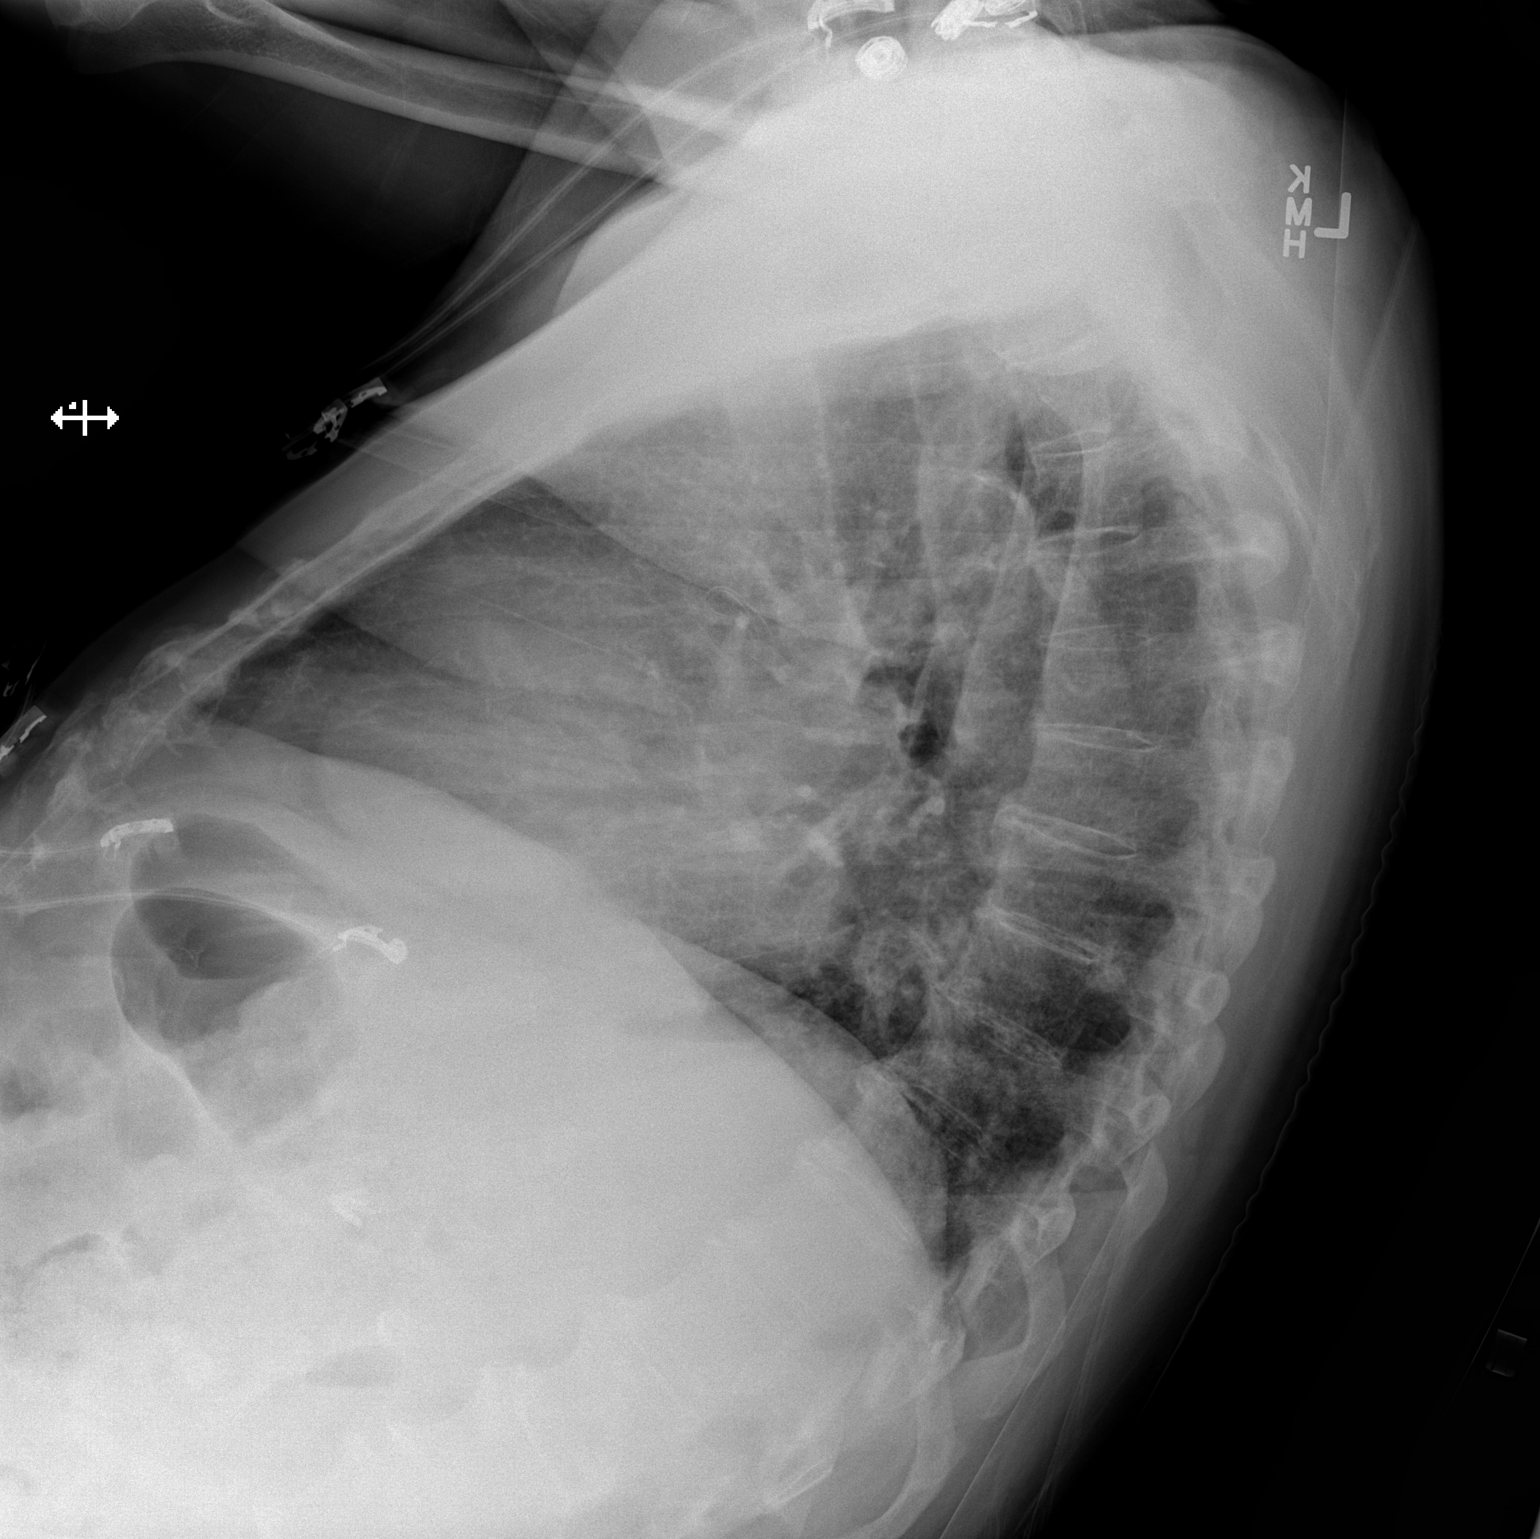

[x chest ap]
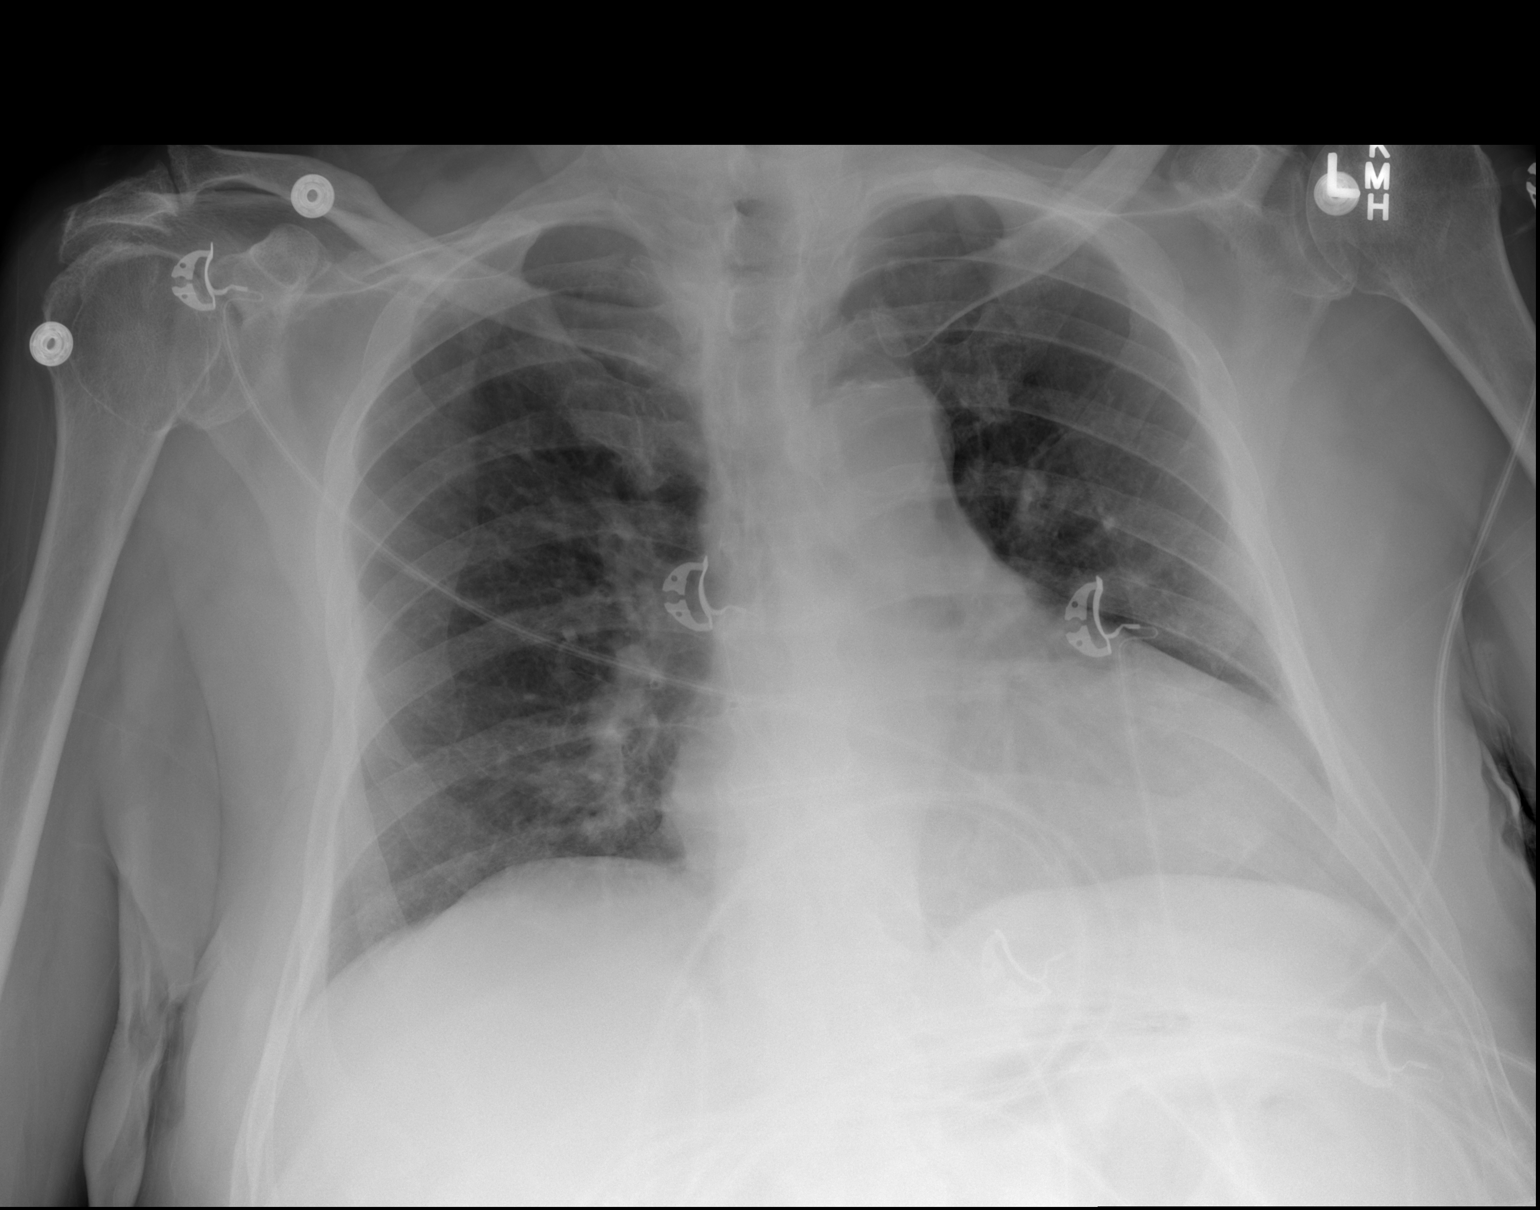

[2 of 2 positions shown; findings below may reference images not displayed]

FINDINGS: Mediastinum and hilar structures are normal. Subsequent atelectasis
and/or infiltrate right lung base. Cardiomegaly with normal
pulmonary vascularity. No pneumothorax. No acute bony abnormality
appear
IMPRESSION: 1.  Mild right lung base subsegmental atelectasis and or infiltrate.

2.  Stable cardiomegaly.  No CHF.

## 2015-07-30 ENCOUNTER — Ambulatory Visit (INDEPENDENT_AMBULATORY_CARE_PROVIDER_SITE_OTHER): Payer: Medicare Other | Admitting: Podiatry

## 2015-07-30 ENCOUNTER — Encounter: Payer: Self-pay | Admitting: Podiatry

## 2015-07-30 DIAGNOSIS — B351 Tinea unguium: Secondary | ICD-10-CM | POA: Diagnosis not present

## 2015-07-30 DIAGNOSIS — M79676 Pain in unspecified toe(s): Secondary | ICD-10-CM | POA: Diagnosis not present

## 2015-07-30 NOTE — Patient Instructions (Signed)
Diabetes and Foot Care Diabetes may cause you to have problems because of poor blood supply (circulation) to your feet and legs. This may cause the skin on your feet to become thinner, break easier, and heal more slowly. Your skin may become dry, and the skin may peel and crack. You may also have nerve damage in your legs and feet causing decreased feeling in them. You may not notice minor injuries to your feet that could lead to infections or more serious problems. Taking care of your feet is one of the most important things you can do for yourself.  HOME CARE INSTRUCTIONS  Wear shoes at all times, even in the house. Do not go barefoot. Bare feet are easily injured.  Check your feet daily for blisters, cuts, and redness. If you cannot see the bottom of your feet, use a mirror or ask someone for help.  Wash your feet with warm water (do not use hot water) and mild soap. Then pat your feet and the areas between your toes until they are completely dry. Do not soak your feet as this can dry your skin.  Apply a moisturizing lotion or petroleum jelly (that does not contain alcohol and is unscented) to the skin on your feet and to dry, brittle toenails. Do not apply lotion between your toes.  Trim your toenails straight across. Do not dig under them or around the cuticle. File the edges of your nails with an emery board or nail file.  Do not cut corns or calluses or try to remove them with medicine.  Wear clean socks or stockings every day. Make sure they are not too tight. Do not wear knee-high stockings since they may decrease blood flow to your legs.  Wear shoes that fit properly and have enough cushioning. To break in new shoes, wear them for just a few hours a day. This prevents you from injuring your feet. Always look in your shoes before you put them on to be sure there are no objects inside.  Do not cross your legs. This may decrease the blood flow to your feet.  If you find a minor scrape,  cut, or break in the skin on your feet, keep it and the skin around it clean and dry. These areas may be cleansed with mild soap and water. Do not cleanse the area with peroxide, alcohol, or iodine.  When you remove an adhesive bandage, be sure not to damage the skin around it.  If you have a wound, look at it several times a day to make sure it is healing.  Do not use heating pads or hot water bottles. They may burn your skin. If you have lost feeling in your feet or legs, you may not know it is happening until it is too late.  Make sure your health care provider performs a complete foot exam at least annually or more often if you have foot problems. Report any cuts, sores, or bruises to your health care provider immediately. SEEK MEDICAL CARE IF:   You have an injury that is not healing.  You have cuts or breaks in the skin.  You have an ingrown nail.  You notice redness on your legs or feet.  You feel burning or tingling in your legs or feet.  You have pain or cramps in your legs and feet.  Your legs or feet are numb.  Your feet always feel cold. SEEK IMMEDIATE MEDICAL CARE IF:   There is increasing redness,   swelling, or pain in or around a wound.  There is a red line that goes up your leg.  Pus is coming from a wound.  You develop a fever or as directed by your health care provider.  You notice a bad smell coming from an ulcer or wound.   This information is not intended to replace advice given to you by your health care provider. Make sure you discuss any questions you have with your health care provider.   Document Released: 01/16/2000 Document Revised: 09/20/2012 Document Reviewed: 06/27/2012 Elsevier Interactive Patient Education 2016 [employer] Inc[employer]  

## 2015-07-30 NOTE — Progress Notes (Signed)
Subjective:    Patient ID: Nicholas Hardin, male    DOB: 07/14/1945, 70 y.o.   MRN: [id number] Chief Complaint  Patient presents with  . power of attorney ppw  . 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  He lives with distant extended family and has formed an excellent relationship with the adult son (Nicholas Hardin's nephew) in the house who has really actively stepped into the caregiver role for Nicholas Hardin to help monitor his diet, bowels, care for wounds, etc admirably and skillfully.  He is here today to recheck on his chronic medical conditions as detailed below.  Type 2 DM: was on medications prior (met, SU) but through tlc was able to get off. Last a1c 5.3 -> 6.8 ->7.2.  Optho is dr. Einar Gip. Asa? Sees Dr. Amalia Hailey reg for foot care. Foot exam and nml urine microalb done 03/25/2015 Peripheral neuropathy: Peripheral vascular disease with h/o edema and recurrent venous stasis ulcerations: HTN: on lisinopril 10 HPL: goal < 70 due to PVD and DM: on simvastatin 20 and was at goal on 03/2015 Chronic constipation: controlled through miralax prn H/o vit B12 def: resolved on several checks in 2014 Onychomycosis: sees podiatry Dr. Amalia Hailey  Last AWV was 03/2015. Our records notes that pt had colonoscopy with Ginger Blue 05/2008 - was told to repeat in 5 years due to adenomatous polyp. Referral was placed back to [facility] but they have no record of seeing pt prior.  Past Medical History  Diagnosis Date  . Diabetes mellitus without complication ([place])   . Hypertension   . Cognitive deficits   . Hyperlipidemia    Past Surgical History  Procedure Laterality Date  . Dental surgery      had all teeth removed  . Cholecystectomy N/A 09/06/2012    Procedure: LAPAROSCOPIC CHOLECYSTECTOMY WITH INTRAOPERATIVE CHOLANGIOGRAM;  Surgeon: Merrie Roof, MD;  Location: [facility];  Service: General;  Laterality: N/A;  Kennyth Arnold blader N/A     09/06/2012   Current Outpatient Prescriptions on File Prior to Visit  Medication Sig Dispense Refill  . aspirin 81 MG chewable tablet Chew 81 mg by mouth at bedtime.    . Cyanocobalamin (B-12) 500 MCG TABS Take 500 mcg by mouth daily.     Marland Kitchen lisinopril (PRINIVIL,ZESTRIL) 10 MG tablet TAKE 1 TABLET BY MOUTH EVERY DAY 30 tablet 2  . Multiple Vitamin (MULTIVITAMIN) tablet Take 1 tablet by mouth daily.    Marland Kitchen OVER THE COUNTER MEDICATION Take 1 tablet by mouth at bedtime. "Leg cramps pill"    . simvastatin (ZOCOR) 20 MG tablet TAKE 1 TABLET BY MOUTH EVERY NIGHT AT BEDTIME 90 tablet 1  . senna (SENOKOT) 8.6 MG TABS tablet Take 2 tablets (17.2 mg total) by mouth daily. (Patient not taking: Reported on 03/25/2015) 60 each 0   No current facility-administered medications on file prior to visit.   Allergies  Allergen Reactions  . Sulfa Antibiotics Hives   Family History  Problem Relation Age of Onset  . Cancer Father   . Heart attack Paternal Grandfather   . Kidney disease Paternal Aunt    Social History   Social History  . Marital Status: Single    Spouse Name: N/A  . Number of Children: N/A  . Years of Education: N/A   Social History Main Topics  . Smoking status: Never Smoker   . Smokeless  tobacco: Never Used  . Alcohol Use: No  . Drug Use: No  . Sexual Activity: Not Asked   Other Topics Concern  . None   Social History Narrative     Review of Systems See hpi    Objective:  BP 115/60 mmHg  Pulse 129  Temp(Src) 98 F (36.7 C) (Oral)  Resp 18  Ht '5\' 5"'  (1.651 m)  Wt 202 lb (91.627 kg)  BMI 33.61 kg/m2  SpO2 98%  Physical Exam  Constitutional: He is oriented to person, place, and time. He appears well-developed and well-nourished. No distress.  HENT:  Head: Normocephalic and atraumatic.  Eyes: Conjunctivae are normal. Pupils are equal, round, and reactive to light. No scleral icterus.  Neck: Normal range of motion. Neck supple. No thyromegaly  present.  Cardiovascular: Normal rate, regular rhythm, normal heart sounds and intact distal pulses.   Pulmonary/Chest: Effort normal and breath sounds normal. No respiratory distress.  Musculoskeletal: He exhibits no edema.  Lymphadenopathy:    He has no cervical adenopathy.  Neurological: He is alert and oriented to person, place, and time.  Skin: Skin is warm and dry. He is not diaphoretic.  Psychiatric: He has a normal mood and affect. His behavior is normal.      EKG: a. Fib with RVR 112-120    Last eGFR 78  Results for orders placed or performed in visit on 07/31/15  POCT glycosylated hemoglobin (Hb A1C)  Result Value Ref Range   Hemoglobin A1C 7.5     Assessment & Plan:  a1c - resume metformin if a1c is >7 today Try calling [facility] to see where prior colonoscopy was done.  New diagnosis of a. Fib: CHA2DS2-VASc score is 4 for HTN, PAD, age, and DM so start on metoprolol and anticoagulation, start xarelto 53m qd 1. Type 2 diabetes mellitus without complication, without long-term current use of insulin (HCC) - restart metformin, nephew proposes to buy pt less potato chips as Nicholas Hardin does seem to binge whenever he has those foods available.  2. 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 Type 2 diabetes mellitus with peripheral circulatory disorder (HCC)   4. Essential hypertension, benign   5. Hyperlipidemia LDL goal <70   6. Paroxysmal ventricular tachycardia ([facility]   7. Atrial fibrillation, unspecified type ([place] - new diagnosis, start metoprolol and blood thinner- pt's nephew does not think anyone would be able to bring pt in every 1-2 wks for an INR so will start a NOAC.    Orders Placed This Encounter  Procedures  . Comprehensive metabolic panel    Order Specific Question:  Has the patient fasted?    Answer:  Yes  . Ambulatory referral to Cardiology    Referral Priority:  Urgent    Referral Type:  Consultation     Referral Reason:  Specialty Services Required    Requested Specialty:  Cardiology    Number of Visits Requested:  1  . POCT glycosylated hemoglobin (Hb A1C)  . EKG 12-Lead    Meds ordered this encounter  Medications  . DISCONTD: metoprolol tartrate (LOPRESSOR) 25 MG tablet    Sig: Take 0.5 tablets (12.5 mg total) by mouth 2 (two) times daily.    Dispense:  60 tablet    Refill:  1  . metFORMIN (GLUCOPHAGE) 500 MG tablet    Sig: Take 1 tablet (500 mg total) by mouth daily with breakfast.    Dispense:  180 tablet  Refill:  1  . rivaroxaban (XARELTO) 20 MG TABS tablet    Sig: Take 1 tablet (20 mg total) by mouth daily with supper.    Dispense:  30 tablet    Refill:  0  . metoprolol tartrate (LOPRESSOR) 25 MG tablet    Sig: Take 0.5 tablets (12.5 mg total) by mouth 2 (two) times daily.    Dispense:  30 tablet    Refill:  0    Delman Cheadle, M.D.  [facility] [address] [place], [place] [address] [phone] phone [phone] fax  08/06/2015 8:04 AM

## 2015-07-31 ENCOUNTER — Encounter: Payer: Self-pay | Admitting: Family Medicine

## 2015-07-31 ENCOUNTER — Ambulatory Visit (INDEPENDENT_AMBULATORY_CARE_PROVIDER_SITE_OTHER): Payer: Medicare Other | Admitting: Family Medicine

## 2015-07-31 ENCOUNTER — Telehealth: Payer: Self-pay

## 2015-07-31 VITALS — BP 115/60 | HR 129 | Temp 98.0°F | Resp 18 | Ht 65.0 in | Wt 202.0 lb

## 2015-07-31 DIAGNOSIS — I472 Ventricular tachycardia: Secondary | ICD-10-CM

## 2015-07-31 DIAGNOSIS — I4891 Unspecified atrial fibrillation: Secondary | ICD-10-CM

## 2015-07-31 DIAGNOSIS — E785 Hyperlipidemia, unspecified: Secondary | ICD-10-CM

## 2015-07-31 DIAGNOSIS — I10 Essential (primary) hypertension: Secondary | ICD-10-CM | POA: Diagnosis not present

## 2015-07-31 DIAGNOSIS — E1143 Type 2 diabetes mellitus with diabetic autonomic (poly)neuropathy: Secondary | ICD-10-CM | POA: Diagnosis not present

## 2015-07-31 DIAGNOSIS — E1151 Type 2 diabetes mellitus with diabetic peripheral angiopathy without gangrene: Secondary | ICD-10-CM | POA: Diagnosis not present

## 2015-07-31 DIAGNOSIS — E119 Type 2 diabetes mellitus without complications: Secondary | ICD-10-CM | POA: Diagnosis not present

## 2015-07-31 DIAGNOSIS — I4729 Other ventricular tachycardia: Secondary | ICD-10-CM

## 2015-07-31 LAB — COMPREHENSIVE METABOLIC PANEL
ALBUMIN: 4.4 g/dL (ref 3.6–5.1)
ALK PHOS: 57 U/L (ref 40–115)
ALT: 13 U/L (ref 9–46)
AST: 17 U/L (ref 10–35)
BILIRUBIN TOTAL: 0.6 mg/dL (ref 0.2–1.2)
BUN: 23 mg/dL (ref 7–25)
CALCIUM: 9.3 mg/dL (ref 8.6–10.3)
CO2: 27 mmol/L (ref 20–31)
CREATININE: 1.27 mg/dL — AB (ref 0.70–1.25)
Chloride: 102 mmol/L (ref 98–110)
Glucose, Bld: 191 mg/dL — ABNORMAL HIGH (ref 65–99)
Potassium: 4.8 mmol/L (ref 3.5–5.3)
Sodium: 139 mmol/L (ref 135–146)
TOTAL PROTEIN: 6.9 g/dL (ref 6.1–8.1)

## 2015-07-31 LAB — POCT GLYCOSYLATED HEMOGLOBIN (HGB A1C): Hemoglobin A1C: 7.5

## 2015-07-31 MED ORDER — METOPROLOL TARTRATE 25 MG PO TABS: 12.5000 mg | ORAL_TABLET | Freq: Two times a day (BID) | ORAL | Status: AC

## 2015-07-31 MED ORDER — METOPROLOL TARTRATE 25 MG PO TABS: 12.5000 mg | ORAL_TABLET | Freq: Two times a day (BID) | ORAL | Status: DC

## 2015-07-31 MED ORDER — RIVAROXABAN 20 MG PO TABS: 20.0000 mg | ORAL_TABLET | Freq: Every day | ORAL | Status: AC

## 2015-07-31 MED ORDER — METFORMIN HCL 500 MG PO TABS: 500.0000 mg | ORAL_TABLET | Freq: Every day | ORAL | Status: DC

## 2015-07-31 NOTE — Patient Instructions (Addendum)
Start 1 metformin and 1/2 tab of the metoprolol with breakfast Start 1/2 tab of the metoprolol and 1 tab of the xarelto with dinner.  Recheck with me on Monday July 3rd - call first the in the morning for an appointment with me at 8 a.m.  IF you received an x-ray today, you will receive an invoice from [facility]. Please contact [facility] at [phone] with questions or concerns regarding your invoice.   IF you received labwork today, you will receive an invoice from [facility]. Please contact [facility] at [phone] with questions or concerns regarding your invoice.   Our billing staff will not be able to assist you with questions regarding bills from these companies.  You will be contacted with the lab results as soon as they are available. The fastest way to get your results is to activate your My Chart account. Instructions are located on the last page of this paperwork. If you have not heard from us regarding the results in 2 weeks, please contact this office.    Atrial Fibrillation Atrial fibrillation is a type of irregular or rapid heartbeat (arrhythmia). In atrial fibrillation, the heart quivers continuously in a chaotic pattern. This occurs when parts of the heart receive disorganized signals that make the heart unable to pump blood normally. This can increase the risk for stroke, heart failure, and other heart-related conditions. There are different types of atrial fibrillation, including:  Paroxysmal atrial fibrillation. This type starts suddenly, and it usually stops on its own shortly after it starts.  Persistent atrial fibrillation. This type often lasts longer than a week. It may stop on its own or with treatment.  Long-lasting persistent atrial fibrillation. This type lasts longer than 12 months.  Permanent atrial fibrillation. This type does not go away. Talk with your health care provider to learn about the type of atrial  fibrillation that you have. CAUSES This condition is caused by some heart-related conditions or procedures, including:  A heart attack.  Coronary artery disease.  Heart failure.  Heart valve conditions.  High blood pressure.  Inflammation of the sac that surrounds the heart (pericarditis).  Heart surgery.  Certain heart rhythm disorders, such as Wolf-Parkinson-White syndrome. Other causes include:  Pneumonia.  Obstructive sleep apnea.  Blockage of an artery in the lungs (pulmonary embolism, or PE).  Lung cancer.  Chronic lung disease.  Thyroid problems, especially if the thyroid is overactive (hyperthyroidism).  Caffeine.  Excessive alcohol use or illegal drug use.  Use of some medicines, including certain decongestants and diet pills. Sometimes, the cause cannot be found. RISK FACTORS This condition is more likely to develop in:  People who are older in age.  People who smoke.  People who have diabetes mellitus.  People who are overweight (obese).  Athletes who exercise vigorously. SYMPTOMS Symptoms of this condition include:  A feeling that your heart is beating rapidly or irregularly.  A feeling of discomfort or pain in your chest.  Shortness of breath.  Sudden light-headedness or weakness.  Getting tired easily during exercise. In some cases, there are no symptoms. DIAGNOSIS Your health care provider may be able to detect atrial fibrillation when taking your pulse. If detected, this condition may be diagnosed with:  An electrocardiogram (ECG).  A Holter monitor test that records your heartbeat patterns over a 24-hour period.  Transthoracic echocardiogram (TTE) to evaluate how blood flows through your heart.  Transesophageal echocardiogram (TEE) to view more detailed images of your heart.  A  stress test.  Imaging tests, such as a CT scan or chest X-ray.  Blood tests. TREATMENT The main goals of treatment are to prevent blood clots  from forming and to keep your heart beating at a normal rate and rhythm. The type of treatment that you receive depends on many factors, such as your underlying medical conditions and how you feel when you are experiencing atrial fibrillation. This condition may be treated with:  Medicine to slow down the heart rate, bring the heart's rhythm back to normal, or prevent clots from forming.  Electrical cardioversion. This is a procedure that resets your heart's rhythm by delivering a controlled, low-energy shock to the heart through your skin.  Different types of ablation, such as catheter ablation, catheter ablation with pacemaker, or surgical ablation. These procedures destroy the heart tissues that send abnormal signals. When the pacemaker is used, it is placed under your skin to help your heart beat in a regular rhythm. HOME CARE INSTRUCTIONS  Take over-the counter and prescription medicines only as told by your health care provider.  If your health care provider prescribed a blood-thinning medicine (anticoagulant), take it exactly as told. Taking too much blood-thinning medicine can cause bleeding. If you do not take enough blood-thinning medicine, you will not have the protection that you need against stroke and other problems.  Do not use tobacco products, including cigarettes, chewing tobacco, and e-cigarettes. If you need help quitting, ask your health care provider.  If you have obstructive sleep apnea, manage your condition as told by your health care provider.  Do not drink alcohol.  Do not drink beverages that contain caffeine, such as coffee, soda, and tea.  Maintain a healthy weight. Do not use diet pills unless your health care provider approves. Diet pills may make heart problems worse.  Follow diet instructions as told by your health care provider.  Exercise regularly as told by your health care provider.  Keep all follow-up visits as told by your health care provider. This  is important. PREVENTION  Avoid drinking beverages that contain caffeine or alcohol.  Avoid certain medicines, especially medicines that are used for breathing problems.  Avoid certain herbs and herbal medicines, such as those that contain ephedra or ginseng.  Do not use illegal drugs, such as cocaine and amphetamines.  Do not smoke.  Manage your high blood pressure. SEEK MEDICAL CARE IF:  You notice a change in the rate, rhythm, or strength of your heartbeat.  You are taking an anticoagulant and you notice increased bruising.  You tire more easily when you exercise or exert yourself. SEEK IMMEDIATE MEDICAL CARE IF:  You have chest pain, abdominal pain, sweating, or weakness.  You feel nauseous.  You notice blood in your vomit, bowel movement, or urine.  You have shortness of breath.  You suddenly have swollen feet and ankles.  You feel dizzy.  You have sudden weakness or numbness of the face, arm, or leg, especially on one side of the body.  You have trouble speaking, trouble understanding, or both (aphasia).  Your face or your eyelid droops on one side. These symptoms may represent a serious problem that is an emergency. Do not wait to see if the symptoms will go away. Get medical help right away. Call your local emergency services (911 in the [place] Do not drive yourself to the hospital.   This information is not intended to replace advice given to you by your health care provider. Make sure you discuss any questions  you have with your health care provider.   Document Released: 01/18/2005 Document Revised: 10/09/2014 Document Reviewed: 05/15/2014 Elsevier Interactive Patient Education [employer]

## 2015-07-31 NOTE — Telephone Encounter (Signed)
Plan does not cover Xarelto 20 mg tablets. Routed to Dr. Clelia CroftShaw

## 2015-07-31 NOTE — Progress Notes (Signed)
Patient ID: Nicholas Hardin, male   DOB: [birth year], 70 y.o.   MRN: [id number]  Subjective: This patient presents again complaining of toenails. His cousin is present in the treatment room today. Patient advises us that he has discontinued medication for diabetes and some blood pressure medicine from the visit of a 04/22/2014  Objective: The toenails are hypertrophic, elongated, discolored and tender to palpation 6-10 No open skin lesions bilaterally Hammertoe 1-5 bilaterally  Assessment: Symptomatic onychomycoses 6-10 History of diabetes  Plan: Debridement of toenails 10 mechanically and electrically without anybleeding  Reappoint at three-month intervals

## 2015-07-31 NOTE — Telephone Encounter (Signed)
What do they cover?

## 2015-08-01 ENCOUNTER — Telehealth: Payer: Self-pay | Admitting: Emergency Medicine

## 2015-08-01 DIAGNOSIS — E7800 Pure hypercholesterolemia, unspecified: Secondary | ICD-10-CM | POA: Diagnosis not present

## 2015-08-01 DIAGNOSIS — I4891 Unspecified atrial fibrillation: Secondary | ICD-10-CM | POA: Diagnosis not present

## 2015-08-01 DIAGNOSIS — E1140 Type 2 diabetes mellitus with diabetic neuropathy, unspecified: Secondary | ICD-10-CM | POA: Diagnosis not present

## 2015-08-01 DIAGNOSIS — I10 Essential (primary) hypertension: Secondary | ICD-10-CM | POA: Diagnosis not present

## 2015-08-01 MED ORDER — APIXABAN 5 MG PO TABS: 5.0000 mg | ORAL_TABLET | Freq: Two times a day (BID) | ORAL | Status: DC

## 2015-08-01 NOTE — Telephone Encounter (Signed)
Please call pt asap

## 2015-08-01 NOTE — Telephone Encounter (Signed)
Perfect.  Either cardiology can write this or pt can recheck with me on Monday morning to make sure that the lopressor dose isn't to high and can rx then.

## 2015-08-01 NOTE — Telephone Encounter (Signed)
Spoke with pts care giver and informed her of this.

## 2015-08-01 NOTE — Telephone Encounter (Signed)
Please let pt know when eliquis is sent in.

## 2015-08-01 NOTE — Telephone Encounter (Signed)
The preferred alternative is Eliquis.

## 2015-08-01 NOTE — Addendum Note (Signed)
Addended by: Norberto SorensonSHAW, EVA on: 08/01/2015 02:25 PM   Modules accepted: Orders

## 2015-08-01 NOTE — Telephone Encounter (Signed)
Spoke with the caregiver [name] and instructed the new blood thinner [facility] Pt was seen by Cardiologist today with new directions to take Lopressor 25 mg tablet TID Medication dispense/refill changed

## 2015-08-01 NOTE — Telephone Encounter (Signed)
Medication dosing and dispense not changer per Dr. Clelia CroftShaw

## 2015-08-02 ENCOUNTER — Telehealth: Payer: Self-pay

## 2015-08-02 NOTE — Telephone Encounter (Signed)
Eliquis 5mg  approved via CoverMyMeds.com Spoke w/caregiver - She will call Cardiologists Monday 08/04/2015 to advise and confirm That Xarelto is not covered by Ins - Eliquis is covered. Did not fax note to [facility].

## 2015-08-02 NOTE — Telephone Encounter (Signed)
Eliquis 5mg  approved via CoverMyMeds.com [facility] [address] with note to contact pt when rx ready

## 2015-08-04 NOTE — Telephone Encounter (Signed)
Rose stated pt's caregiver would call cardiologist today.

## 2015-08-08 DIAGNOSIS — I4891 Unspecified atrial fibrillation: Secondary | ICD-10-CM | POA: Diagnosis not present

## 2015-08-27 ENCOUNTER — Other Ambulatory Visit: Payer: Self-pay | Admitting: Family Medicine

## 2015-08-27 ENCOUNTER — Encounter: Payer: Self-pay | Admitting: Family Medicine

## 2015-08-27 DIAGNOSIS — I4891 Unspecified atrial fibrillation: Secondary | ICD-10-CM | POA: Diagnosis not present

## 2015-08-27 DIAGNOSIS — R9439 Abnormal result of other cardiovascular function study: Secondary | ICD-10-CM | POA: Diagnosis not present

## 2015-09-11 DIAGNOSIS — I10 Essential (primary) hypertension: Secondary | ICD-10-CM | POA: Diagnosis not present

## 2015-09-11 DIAGNOSIS — I4891 Unspecified atrial fibrillation: Secondary | ICD-10-CM | POA: Diagnosis not present

## 2015-09-11 DIAGNOSIS — E7800 Pure hypercholesterolemia, unspecified: Secondary | ICD-10-CM | POA: Diagnosis not present

## 2015-09-11 DIAGNOSIS — E1140 Type 2 diabetes mellitus with diabetic neuropathy, unspecified: Secondary | ICD-10-CM | POA: Diagnosis not present

## 2015-10-02 DIAGNOSIS — I10 Essential (primary) hypertension: Secondary | ICD-10-CM | POA: Diagnosis not present

## 2015-10-02 DIAGNOSIS — E1140 Type 2 diabetes mellitus with diabetic neuropathy, unspecified: Secondary | ICD-10-CM | POA: Diagnosis not present

## 2015-10-02 DIAGNOSIS — I4891 Unspecified atrial fibrillation: Secondary | ICD-10-CM | POA: Diagnosis not present

## 2015-10-02 DIAGNOSIS — E7800 Pure hypercholesterolemia, unspecified: Secondary | ICD-10-CM | POA: Diagnosis not present

## 2015-11-04 ENCOUNTER — Encounter: Payer: Medicare Other | Admitting: Podiatry

## 2015-11-11 ENCOUNTER — Ambulatory Visit (INDEPENDENT_AMBULATORY_CARE_PROVIDER_SITE_OTHER): Payer: Medicare Other | Admitting: Podiatry

## 2015-11-11 ENCOUNTER — Encounter: Payer: Self-pay | Admitting: Podiatry

## 2015-11-11 VITALS — BP 146/90 | HR 85 | Resp 14

## 2015-11-11 DIAGNOSIS — M79676 Pain in unspecified toe(s): Secondary | ICD-10-CM | POA: Diagnosis not present

## 2015-11-11 DIAGNOSIS — B351 Tinea unguium: Secondary | ICD-10-CM | POA: Diagnosis not present

## 2015-11-11 NOTE — Progress Notes (Signed)
Patient ID: Nicholas Hardin, male   DOB: 11/24/1945, 70 y.o.   MRN: [id number]    Subjective: This patient presents again complaining of toenails. His cousin is present in the treatment room today. Patient advises us that he has discontinued medication for diabetes and some blood pressure medicine from the visit of a 04/22/2014  Objective: Appears orientated 3 DP and PT pulses 2/4 bilaterally Capillary reflex immediate bilaterally Skin tear 3 mm plantar medial left hallux without any surrounding erythema, edema, warmth, drainage Dry blood medial margin third right toenail  The toenails are hypertrophic, elongated, discolored and tender to palpation 6-10 No open skin lesions bilaterally Hammertoe 1-5 bilaterally  Assessment: Noninfected skin tear left hallux Symptomatic onychomycoses 6-10 History of diabetes  Plan: Debridement of toenails 10 mechanically and electrically without anybleeding Apply topical antibiotic ointment and Band-Aid to the left hallux and instructed patient to continue doing his daily until a scab forms  Reappoint at three-month intervals

## 2015-11-11 NOTE — Patient Instructions (Signed)
There was a small skin tear on your left great toe today. Apply topical antibiotic ointment and Band-Aid daily to this area until a scab forms  Diabetes and Foot Care Diabetes may cause you to have problems because of poor blood supply (circulation) to your feet and legs. This may cause the skin on your feet to become thinner, break easier, and heal more slowly. Your skin may become dry, and the skin may peel and crack. You may also have nerve damage in your legs and feet causing decreased feeling in them. You may not notice minor injuries to your feet that could lead to infections or more serious problems. Taking care of your feet is one of the most important things you can do for yourself.  HOME CARE INSTRUCTIONS  Wear shoes at all times, even in the house. Do not go barefoot. Bare feet are easily injured.  Check your feet daily for blisters, cuts, and redness. If you cannot see the bottom of your feet, use a mirror or ask someone for help.  Wash your feet with warm water (do not use hot water) and mild soap. Then pat your feet and the areas between your toes until they are completely dry. Do not soak your feet as this can dry your skin.  Apply a moisturizing lotion or petroleum jelly (that does not contain alcohol and is unscented) to the skin on your feet and to dry, brittle toenails. Do not apply lotion between your toes.  Trim your toenails straight across. Do not dig under them or around the cuticle. File the edges of your nails with an emery board or nail file.  Do not cut corns or calluses or try to remove them with medicine.  Wear clean socks or stockings every day. Make sure they are not too tight. Do not wear knee-high stockings since they may decrease blood flow to your legs.  Wear shoes that fit properly and have enough cushioning. To break in new shoes, wear them for just a few hours a day. This prevents you from injuring your feet. Always look in your shoes before you put them on  to be sure there are no objects inside.  Do not cross your legs. This may decrease the blood flow to your feet.  If you find a minor scrape, cut, or break in the skin on your feet, keep it and the skin around it clean and dry. These areas may be cleansed with mild soap and water. Do not cleanse the area with peroxide, alcohol, or iodine.  When you remove an adhesive bandage, be sure not to damage the skin around it.  If you have a wound, look at it several times a day to make sure it is healing.  Do not use heating pads or hot water bottles. They may burn your skin. If you have lost feeling in your feet or legs, you may not know it is happening until it is too late.  Make sure your health care provider performs a complete foot exam at least annually or more often if you have foot problems. Report any cuts, sores, or bruises to your health care provider immediately. SEEK MEDICAL CARE IF:   You have an injury that is not healing.  You have cuts or breaks in the skin.  You have an ingrown nail.  You notice redness on your legs or feet.  You feel burning or tingling in your legs or feet.  You have pain or cramps in your  legs and feet.  Your legs or feet are numb.  Your feet always feel cold. SEEK IMMEDIATE MEDICAL CARE IF:   There is increasing redness, swelling, or pain in or around a wound.  There is a red line that goes up your leg.  Pus is coming from a wound.  You develop a fever or as directed by your health care provider.  You notice a bad smell coming from an ulcer or wound.   This information is not intended to replace advice given to you by your health care provider. Make sure you discuss any questions you have with your health care provider.   Document Released: 01/16/2000 Document Revised: 09/20/2012 Document Reviewed: 06/27/2012 Elsevier Interactive Patient Education [employer].

## 2016-01-23 DIAGNOSIS — I10 Essential (primary) hypertension: Secondary | ICD-10-CM | POA: Diagnosis not present

## 2016-01-23 DIAGNOSIS — I4891 Unspecified atrial fibrillation: Secondary | ICD-10-CM | POA: Diagnosis not present

## 2016-02-11 ENCOUNTER — Ambulatory Visit (INDEPENDENT_AMBULATORY_CARE_PROVIDER_SITE_OTHER): Payer: Medicare Other | Admitting: Podiatry

## 2016-02-11 ENCOUNTER — Encounter: Payer: Self-pay | Admitting: Podiatry

## 2016-02-11 VITALS — BP 148/88 | HR 79 | Resp 18

## 2016-02-11 DIAGNOSIS — M79676 Pain in unspecified toe(s): Secondary | ICD-10-CM

## 2016-02-11 DIAGNOSIS — B351 Tinea unguium: Secondary | ICD-10-CM | POA: Diagnosis not present

## 2016-02-11 DIAGNOSIS — E1140 Type 2 diabetes mellitus with diabetic neuropathy, unspecified: Secondary | ICD-10-CM

## 2016-02-11 NOTE — Progress Notes (Signed)
Patient ID: Nicholas Hardin, male   DOB: [birth year], 71 y.o.   MRN: [id number]    Subjective: This patient presents again complaining of toenails. His cousin is present in the treatment room today. Patient advises us that he has discontinued medication for diabetes and some blood pressure medicine from the visit of a 04/22/2014  Objective: Appears orientated 3 DP 1-4 right 2-4 left PT pulses 1/4 bilaterally Capillary reflex immediate bilaterally This patient to 10 g monofilament wire intact 4/5 bilaterally Vibratory sensation nonreactive bilaterally Ankle reflexes weakly reactive bilaterally The toenails are hypertrophic, elongated, discolored and tender to palpation 6-10 No open skin lesions bilaterally Atrophic hyperpigmentation noted skin distal lower legs bilaterally Hammertoe 1-5 bilaterally  Assessment: Decrease pedal pulses without open lesions Peripheral neuropathy History of diabetes Symptomatic onychomycoses 6-10   Plan: Debridement of toenails 10 mechanically and electrically without anybleeding  Reappoint 3 months

## 2016-02-11 NOTE — Patient Instructions (Signed)

## 2016-03-15 DIAGNOSIS — I4891 Unspecified atrial fibrillation: Secondary | ICD-10-CM

## 2016-05-12 ENCOUNTER — Ambulatory Visit (INDEPENDENT_AMBULATORY_CARE_PROVIDER_SITE_OTHER): Payer: Medicare Other | Admitting: Podiatry

## 2016-05-12 ENCOUNTER — Encounter: Payer: Self-pay | Admitting: Podiatry

## 2016-05-12 DIAGNOSIS — E1140 Type 2 diabetes mellitus with diabetic neuropathy, unspecified: Secondary | ICD-10-CM

## 2016-05-12 DIAGNOSIS — B351 Tinea unguium: Secondary | ICD-10-CM | POA: Diagnosis not present

## 2016-05-12 DIAGNOSIS — M79676 Pain in unspecified toe(s): Secondary | ICD-10-CM

## 2016-05-12 NOTE — Progress Notes (Signed)
Patient ID: ALLENMICHAEL Hardin, male   DOB: Aug 05, 1945, 71 y.o.   MRN: [id number]    Subjective: This patient presents again complaining of toenails. His cousin is present in the treatment room today. Patient advises [place] that he has discontinued medication for diabetes and some blood pressure medicine from the visit of a 04/22/2014  Objective: Appears orientated 3 DP 1-4 right 2-4 left PT pulses 1/4 bilaterally Capillary reflex immediate bilaterally This patient to 10 g monofilament wire intact 4/5 bilaterally Vibratory sensation nonreactive bilaterally Ankle reflexes weakly reactive bilaterally The toenails are hypertrophic, elongated, discolored and tender to palpation 6-10 No open skin lesions bilaterally Atrophic hyperpigmentation noted skin distal lower legs bilaterally Absent hair growth bilaterally Hammertoe 1-5 bilaterally  Assessment: Decrease pedal pulses without open lesions Peripheral neuropathy History of diabetes Symptomatic onychomycoses 6-10   Plan: Debridement of toenails 10 mechanically and electrically without anybleeding  Reappoint 3 months

## 2016-05-12 NOTE — Patient Instructions (Signed)

## 2016-06-02 ENCOUNTER — Telehealth: Payer: Self-pay | Admitting: Family Medicine
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
## 2016-06-03 MED ORDER — METFORMIN HCL 500 MG PO TABS: 500.0000 mg | ORAL_TABLET | Freq: Every day | ORAL | 0 refills | Status: DC

## 2016-07-05 DIAGNOSIS — I4891 Unspecified atrial fibrillation: Secondary | ICD-10-CM | POA: Diagnosis not present

## 2016-07-20 DIAGNOSIS — E7800 Pure hypercholesterolemia, unspecified: Secondary | ICD-10-CM | POA: Diagnosis not present

## 2016-07-20 DIAGNOSIS — I4891 Unspecified atrial fibrillation: Secondary | ICD-10-CM | POA: Diagnosis not present

## 2016-07-20 DIAGNOSIS — I10 Essential (primary) hypertension: Secondary | ICD-10-CM | POA: Diagnosis not present

## 2016-07-20 LAB — CBC AND DIFFERENTIAL
HCT: 37 — AB (ref 41–53)
HEMOGLOBIN: 12.5 — AB (ref 13.5–17.5)
Neutrophils Absolute: 72
Platelets: 244 (ref 150–399)
WBC: 9

## 2016-07-20 LAB — BASIC METABOLIC PANEL
BUN: 22 — AB (ref 4–21)
Creatinine: 0.9 (ref 0.6–1.3)
GLUCOSE: 276
POTASSIUM: 4.3 (ref 3.4–5.3)

## 2016-08-28 ENCOUNTER — Other Ambulatory Visit: Payer: Self-pay | Admitting: Family Medicine
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
## 2016-09-25 ENCOUNTER — Encounter: Payer: Self-pay | Admitting: Family Medicine

## 2016-09-25 ENCOUNTER — Ambulatory Visit (INDEPENDENT_AMBULATORY_CARE_PROVIDER_SITE_OTHER): Payer: Medicare Other | Admitting: Family Medicine

## 2016-09-25 VITALS — BP 137/85 | HR 88 | Temp 98.4°F | Resp 12 | Ht 63.0 in | Wt 192.4 lb

## 2016-09-25 DIAGNOSIS — Z79899 Other long term (current) drug therapy: Secondary | ICD-10-CM

## 2016-09-25 DIAGNOSIS — I10 Essential (primary) hypertension: Secondary | ICD-10-CM

## 2016-09-25 DIAGNOSIS — E1151 Type 2 diabetes mellitus with diabetic peripheral angiopathy without gangrene: Secondary | ICD-10-CM

## 2016-09-25 DIAGNOSIS — E785 Hyperlipidemia, unspecified: Secondary | ICD-10-CM | POA: Diagnosis not present

## 2016-09-25 DIAGNOSIS — I4891 Unspecified atrial fibrillation: Secondary | ICD-10-CM | POA: Diagnosis not present

## 2016-09-25 DIAGNOSIS — E8881 Metabolic syndrome: Secondary | ICD-10-CM | POA: Diagnosis not present

## 2016-09-25 DIAGNOSIS — E538 Deficiency of other specified B group vitamins: Secondary | ICD-10-CM

## 2016-09-25 DIAGNOSIS — E1143 Type 2 diabetes mellitus with diabetic autonomic (poly)neuropathy: Secondary | ICD-10-CM

## 2016-09-25 LAB — POCT GLYCOSYLATED HEMOGLOBIN (HGB A1C): HEMOGLOBIN A1C: 7.7

## 2016-09-25 MED ORDER — METFORMIN HCL 500 MG PO TABS: 500.0000 mg | ORAL_TABLET | Freq: Two times a day (BID) | ORAL | 1 refills | Status: DC

## 2016-09-25 MED ORDER — DILTIAZEM HCL ER BEADS 180 MG PO CP24: 180.0000 mg | ORAL_CAPSULE | Freq: Every day | ORAL | 1 refills | Status: DC

## 2016-09-25 NOTE — Progress Notes (Signed)
By signing my name below, I, Mesha Guinyard, attest that this documentation has been prepared under the direction and in the presence of Norberto Sorenson, MD.  Electronically Signed: Arvilla Market, Medical Scribe. 09/25/16. 8:57 AM.  Subjective:    Patient ID: Nicholas Hardin, male    DOB: 01/17/1946, 71 y.o.   MRN: [id number]  HPI Chief Complaint  Patient presents with  . Medication Refill    HPI Comments: Nicholas Hardin is a 71 y.o. male who presents to Primary Care at [facility] for medication refill. Pt is fasting.   Pt's stomach is doing better and he eats 3x a day (breakfast, lunch, and dinner). Pt still walks 3-4x a day. Pt rarely has leg cramps buts when he does he has medication for it.   Pt has a medicine box thats fixed for him by his care giver. Pt has a cardiology f/u in Dec 2018. Pt' caregiver, [name], is taking care of him and he gave consent to speak with Waynetta Sandy about any of his medical information.  Patient Active Problem List   Diagnosis Date Noted  . Atrial fibrillation (HCC) 03/15/2016  . Metabolic syndrome 05/24/2014  . Diabetes mellitus type 2, uncomplicated (HCC) 01/10/2014  . Essential hypertension 01/10/2014  . Hyperlipidemia LDL goal <70 09/21/2013  . Mental disability 06/15/2013  . Type 2 diabetes mellitus with peripheral circulatory disorder (HCC) 12/15/2012  . Vitamin B12 deficiency 12/15/2012  . 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 Essential hypertension, benign 12/15/2012  . Polypharmacy 12/15/2012   Past Medical History:  Diagnosis Date  . Cognitive deficits   . Diabetes mellitus without complication (HCC)   . Hyperlipidemia   . Hypertension    Past Surgical History:  Procedure Laterality Date  . CHOLECYSTECTOMY N/A 09/06/2012   Procedure: LAPAROSCOPIC CHOLECYSTECTOMY WITH INTRAOPERATIVE CHOLANGIOGRAM;  Surgeon: Robyne Askew, MD;  Location: [facility] OR;  Service: General;  Laterality: N/A;  . DENTAL SURGERY       had all teeth removed  . gall blader N/A    09/06/2012   Allergies  Allergen Reactions  . Sulfa Antibiotics Hives   Prior to Admission medications   Medication Sig Start Date End Date Taking? Authorizing Provider  aspirin 81 MG chewable tablet Chew 81 mg by mouth at bedtime.   Yes [provider]  atorvastatin (LIPITOR) 10 MG tablet Take 10 mg by mouth daily.  09/04/16  Yes [provider]  diltiazem (TIAZAC) 180 MG 24 hr capsule Take 180 mg by mouth daily.  06/26/16  Yes [provider]  metFORMIN (GLUCOPHAGE) 500 MG tablet Take 1 tablet (500 mg total) by mouth daily with breakfast. **Needs office visit for any refills** 08/28/16  Yes Sherren Mocha, MD  metoprolol tartrate (LOPRESSOR) 25 MG tablet Take 0.5 tablets (12.5 mg total) by mouth 2 (two) times daily. 07/31/15  Yes Sherren Mocha, MD  Multiple Vitamin (MULTIVITAMIN) tablet Take 1 tablet by mouth daily.   Yes [provider]  OVER THE COUNTER MEDICATION Take 1 tablet by mouth at bedtime. "Leg cramps pill"   Yes [provider]  rivaroxaban (XARELTO) 20 MG TABS tablet Take 1 tablet (20 mg total) by mouth daily with supper. 07/31/15  Yes Sherren Mocha, MD  senna (SENOKOT) 8.6 MG TABS tablet Take 2 tablets (17.2 mg total) by mouth daily. 01/12/14  Yes Elgergawy, Leana Roe, MD  vitamin B-12 (CYANOCOBALAMIN) 1000 MCG tablet Take 1,000 mcg by mouth daily.  Yes [provider]  lisinopril (PRINIVIL,ZESTRIL) 10 MG tablet TAKE 1 TABLET BY MOUTH EVERY DAY Patient not taking: Reported on 09/25/2016 09/01/15   Sherren Mocha, MD   Social History   Social History  . Marital status: Single    Spouse name: N/A  . Number of children: N/A  . Years of education: N/A   Occupational History  . Not on file.   Social History Main Topics  . Smoking status: Never Smoker  . Smokeless tobacco: Never Used  . Alcohol use No  . Drug use: No  . Sexual activity: Not on file   Other Topics Concern  . Not on  file   Social History Narrative  . No narrative on file   Family History  Problem Relation Age of Onset  . Cancer Father   . Heart attack Paternal Grandfather   . Kidney disease Paternal Aunt    Depression screen [facility] 2/9 09/25/2016 07/31/2015 03/25/2015 05/24/2014 09/21/2013  Decreased Interest 0 0 0 0 0  Down, Depressed, Hopeless 0 0 0 0 0  PHQ - 2 Score 0 0 0 0 0  Altered sleeping - - - - -  Tired, decreased energy - - - - -  Change in appetite - - - - -  Feeling bad or failure about yourself  - - - - -  Trouble concentrating - - - - -  Moving slowly or fidgety/restless - - - - -  Suicidal thoughts - - - - -  PHQ-9 Score - - - - -    Review of Systems  Constitutional: Negative for appetite change.  Gastrointestinal: Negative for abdominal pain.  Musculoskeletal: Negative for myalgias.   Objective:  Physical Exam  Constitutional: He appears well-developed and well-nourished. No distress.  HENT:  Head: Normocephalic and atraumatic.  Eyes: Conjunctivae are normal.  Neck: Neck supple.  Cardiovascular: Normal heart sounds.  An irregularly irregular rhythm present. Tachycardia present.  Exam reveals no gallop and no friction rub.   No murmur heard. Pulses:      Dorsalis pedis pulses are 2+ on the right side, and 2+ on the left side.  Pulmonary/Chest: Effort normal and breath sounds normal. No respiratory distress. He has no wheezes. He has no rales.  Neurological: He is alert.  Skin: Skin is warm and dry.  Lower extremity skin is hyperpigmented, shiny, and hairless 4 sec cap refill but no wounds or ulcers  Psychiatric: He has a normal mood and affect. His behavior is normal.  Nursing note and vitals reviewed.   Vitals:   09/25/16 0821  BP: 137/85  Pulse: 88  Resp: 12  Temp: 98.4 F (36.9 C)  TempSrc: Oral  SpO2: 99%  Weight: 192 lb 6 oz (87.3 kg)  Height: 5\' 3"  (1.6 m)  Body mass index is 34.08 kg/m. Assessment & Plan:   1. Essential hypertension, benign   2.  Atrial fibrillation, unspecified type (HCC)   3. Type 2 diabetes mellitus with peripheral circulatory disorder (HCC) - increase metformin to 500 bid from qd. Recheck at [facility] in 6 mos  4. 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 Polypharmacy   6. Hyperlipidemia LDL goal <70   7. Metabolic syndrome   8. Vitamin B12 deficiency     Orders Placed This Encounter  Procedures  . Lipid panel    Order Specific Question:   Has the patient fasted?    Answer:   Yes  . Comprehensive metabolic  panel    Order Specific Question:   Has the patient fasted?    Answer:   Yes  . Microalbumin/Creatinine Ratio, Urine  . POCT glycosylated hemoglobin (Hb A1C)  . HM Diabetes Foot Exam    Meds ordered this encounter  Medications  . DISCONTD: diltiazem (TIAZAC) 180 MG 24 hr capsule    Sig: Take 180 mg by mouth daily.     Refill:  0  . vitamin B-12 (CYANOCOBALAMIN) 1000 MCG tablet    Sig: Take 1,000 mcg by mouth daily.  Marland Kitchen atorvastatin (LIPITOR) 10 MG tablet    Sig: Take 10 mg by mouth daily.     Refill:  0  . diltiazem (TIAZAC) 180 MG 24 hr capsule    Sig: Take 1 capsule (180 mg total) by mouth daily.    Dispense:  90 capsule    Refill:  1  . metFORMIN (GLUCOPHAGE) 500 MG tablet    Sig: Take 1 tablet (500 mg total) by mouth 2 (two) times daily with a meal.    Dispense:  180 tablet    Refill:  1    I personally performed the services described in this documentation, which was scribed in my presence. The recorded information has been reviewed and considered, and addended by me as needed.   Norberto Sorenson, M.D.  Primary Care at [facility] [address] [place], [place] [address] [phone] phone [phone] fax  09/27/16 9:24 PM

## 2016-09-25 NOTE — Patient Instructions (Addendum)
  Increase your metformin to twice a day from once a day. In 6 months, come see our nurse Dorthula Perfect for an Annual Wellness Visit and we will get repeat labs then the ensure the higher dose of metformin is sufficient.   IF you received an x-ray today, you will receive an invoice from [facility]. Please contact [facility] at [phone] with questions or concerns regarding your invoice.   IF you received labwork today, you will receive an invoice from [place]. Please contact [facility] at [phone] with questions or concerns regarding your invoice.   Our billing staff will not be able to assist you with questions regarding bills from these companies.  You will be contacted with the lab results as soon as they are available. The fastest way to get your results is to activate your My Chart account. Instructions are located on the last page of this paperwork. If you have not heard from [place] regarding the results in 2 weeks, please contact this office.

## 2016-09-26 LAB — COMPREHENSIVE METABOLIC PANEL
A/G RATIO: 1.6 (ref 1.2–2.2)
ALT: 10 IU/L (ref 0–44)
AST: 19 IU/L (ref 0–40)
Albumin: 4.4 g/dL (ref 3.6–4.8)
Alkaline Phosphatase: 87 IU/L (ref 39–117)
BILIRUBIN TOTAL: 0.6 mg/dL (ref 0.0–1.2)
BUN/Creatinine Ratio: 16 (ref 10–24)
BUN: 14 mg/dL (ref 8–27)
CHLORIDE: 102 mmol/L (ref 96–106)
CO2: 22 mmol/L (ref 20–29)
Calcium: 9.9 mg/dL (ref 8.6–10.2)
Creatinine, Ser: 0.9 mg/dL (ref 0.76–1.27)
GFR calc non Af Amer: 87 mL/min/{1.73_m2} (ref >59)
GFR, EST AFRICAN AMERICAN: 100 mL/min/{1.73_m2} (ref >59)
GLOBULIN, TOTAL: 2.8 g/dL (ref 1.5–4.5)
Glucose: 171 mg/dL — ABNORMAL HIGH (ref 65–99)
POTASSIUM: 4.4 mmol/L (ref 3.5–5.2)
SODIUM: 142 mmol/L (ref 134–144)
TOTAL PROTEIN: 7.2 g/dL (ref 6.0–8.5)

## 2016-09-26 LAB — LIPID PANEL
CHOL/HDL RATIO: 3.8 ratio (ref 0.0–5.0)
CHOLESTEROL TOTAL: 149 mg/dL (ref 100–199)
HDL: 39 mg/dL — ABNORMAL LOW (ref >39)
LDL CALC: 70 mg/dL (ref 0–99)
TRIGLYCERIDES: 199 mg/dL — AB (ref 0–149)
VLDL Cholesterol Cal: 40 mg/dL (ref 5–40)

## 2016-09-26 LAB — MICROALBUMIN / CREATININE URINE RATIO
Creatinine, Urine: 109.1 mg/dL
MICROALB/CREAT RATIO: 21.8 mg/g{creat} (ref 0.0–30.0)
Microalbumin, Urine: 23.8 ug/mL

## 2017-01-17 ENCOUNTER — Telehealth: Payer: Self-pay

## 2017-01-17 NOTE — Telephone Encounter (Signed)
Called pt to schedule AWV. Pt's wife (DPR on file) answered and she said they would call back in January 2019 to schedule his AWV and CPE.    Sherle PoeNicole Alroy Portela, B.A.  Care Guide - [facility] at [facility] [phone]

## 2017-02-07 DIAGNOSIS — I4891 Unspecified atrial fibrillation: Secondary | ICD-10-CM | POA: Diagnosis not present

## 2017-02-07 DIAGNOSIS — E7800 Pure hypercholesterolemia, unspecified: Secondary | ICD-10-CM | POA: Diagnosis not present

## 2017-02-07 DIAGNOSIS — I10 Essential (primary) hypertension: Secondary | ICD-10-CM | POA: Diagnosis not present

## 2017-03-07 DIAGNOSIS — I10 Essential (primary) hypertension: Secondary | ICD-10-CM | POA: Diagnosis not present

## 2017-03-07 DIAGNOSIS — E7800 Pure hypercholesterolemia, unspecified: Secondary | ICD-10-CM | POA: Diagnosis not present

## 2017-03-07 DIAGNOSIS — I4891 Unspecified atrial fibrillation: Secondary | ICD-10-CM | POA: Diagnosis not present

## 2017-03-07 DIAGNOSIS — I340 Nonrheumatic mitral (valve) insufficiency: Secondary | ICD-10-CM | POA: Diagnosis not present

## 2017-03-21 ENCOUNTER — Other Ambulatory Visit: Payer: Self-pay | Admitting: Family Medicine

## 2017-04-04 ENCOUNTER — Encounter: Payer: Medicare Other | Admitting: Family Medicine

## 2017-04-16 ENCOUNTER — Other Ambulatory Visit: Payer: Self-pay

## 2017-04-16 ENCOUNTER — Encounter: Payer: Self-pay | Admitting: Family Medicine

## 2017-04-16 ENCOUNTER — Ambulatory Visit (INDEPENDENT_AMBULATORY_CARE_PROVIDER_SITE_OTHER): Payer: Medicare Other | Admitting: Family Medicine

## 2017-04-16 VITALS — BP 130/78 | HR 91 | Temp 97.4°F | Resp 18 | Ht 63.0 in | Wt 203.4 lb

## 2017-04-16 DIAGNOSIS — K5904 Chronic idiopathic constipation: Secondary | ICD-10-CM | POA: Diagnosis not present

## 2017-04-16 DIAGNOSIS — Z125 Encounter for screening for malignant neoplasm of prostate: Secondary | ICD-10-CM | POA: Diagnosis not present

## 2017-04-16 DIAGNOSIS — E785 Hyperlipidemia, unspecified: Secondary | ICD-10-CM

## 2017-04-16 DIAGNOSIS — E538 Deficiency of other specified B group vitamins: Secondary | ICD-10-CM

## 2017-04-16 DIAGNOSIS — I4891 Unspecified atrial fibrillation: Secondary | ICD-10-CM

## 2017-04-16 DIAGNOSIS — D649 Anemia, unspecified: Secondary | ICD-10-CM

## 2017-04-16 DIAGNOSIS — Z23 Encounter for immunization: Secondary | ICD-10-CM

## 2017-04-16 DIAGNOSIS — Z1211 Encounter for screening for malignant neoplasm of colon: Secondary | ICD-10-CM | POA: Diagnosis not present

## 2017-04-16 DIAGNOSIS — I10 Essential (primary) hypertension: Secondary | ICD-10-CM

## 2017-04-16 DIAGNOSIS — E1151 Type 2 diabetes mellitus with diabetic peripheral angiopathy without gangrene: Secondary | ICD-10-CM | POA: Diagnosis not present

## 2017-04-16 DIAGNOSIS — Z1212 Encounter for screening for malignant neoplasm of rectum: Secondary | ICD-10-CM

## 2017-04-16 DIAGNOSIS — F79 Unspecified intellectual disabilities: Secondary | ICD-10-CM | POA: Diagnosis not present

## 2017-04-16 LAB — POCT GLYCOSYLATED HEMOGLOBIN (HGB A1C): Hemoglobin A1C: 8

## 2017-04-16 MED ORDER — DILTIAZEM HCL ER BEADS 180 MG PO CP24: 180.0000 mg | ORAL_CAPSULE | Freq: Every day | ORAL | 1 refills | Status: DC

## 2017-04-16 MED ORDER — METFORMIN HCL 1000 MG PO TABS: 1000.0000 mg | ORAL_TABLET | Freq: Two times a day (BID) | ORAL | 3 refills | Status: AC

## 2017-04-16 MED ORDER — SENNOSIDES-DOCUSATE SODIUM 8.6-50 MG PO TABS: 4.0000 | ORAL_TABLET | Freq: Every day | ORAL | 3 refills | Status: AC

## 2017-04-16 MED ORDER — LOSARTAN POTASSIUM 25 MG PO TABS: 12.5000 mg | ORAL_TABLET | Freq: Every day | ORAL | 3 refills | Status: DC

## 2017-04-16 NOTE — Progress Notes (Signed)
Subjective:    Patient ID: Nicholas Hardin, male    DOB: November 04, 1945, 72 y.o.   MRN: [id number] Chief Complaint  Patient presents with  . Hypertension  . Hyperlipidemia  . Diabetes    HPI   Cardiac: saw cardiology - Ms. Nicholaus Bloom???  - had f/u scheduled 01/2017 Weight/Blood sugar/Diet/Exercise: BMI Readings from Last 3 Encounters:  04/16/17 36.03 kg/m  09/25/16 34.08 kg/m  07/31/15 33.61 kg/m   Lab Results  Component Value Date   HGBA1C 8.0 04/16/2017   Dentist/Optho:  An eye doctor came out to the house 2 mos ago and checked for DM retinopathy - they don't know who it was - possibly by insurance co and everything was ok.  Immunizations:  Immunization History  Administered Date(s) Administered  . Influenza,inj,Quad PF,6+ Mos 12/15/2012, 09/21/2013, 03/25/2015  . Pneumococcal Conjugate-13 01/18/2014  . Pneumococcal Polysaccharide-23 06/16/2012  . Td 01/31/2013    Chronic Medical Conditions: DMII:  Lab Results  Component Value Date   HGBA1C 8.0 04/16/2017   HGBA1C 7.7 09/25/2016   HGBA1C 7.5 07/31/2015     Past Medical History:  Diagnosis Date  . Cognitive deficits   . Diabetes mellitus without complication (HCC)   . Hyperlipidemia   . Hypertension    Past Surgical History:  Procedure Laterality Date  . CHOLECYSTECTOMY N/A 09/06/2012   Procedure: LAPAROSCOPIC CHOLECYSTECTOMY WITH INTRAOPERATIVE CHOLANGIOGRAM;  Surgeon: Robyne Askew, MD;  Location: [facility] OR;  Service: General;  Laterality: N/A;  . DENTAL SURGERY     had all teeth removed  . gall blader N/A    09/06/2012   Current Outpatient Medications on File Prior to Visit  Medication Sig Dispense Refill  . aspirin 81 MG chewable tablet Chew 81 mg by mouth at bedtime.    Marland Kitchen atorvastatin (LIPITOR) 10 MG tablet Take 10 mg by mouth daily.   0  . diltiazem (TIAZAC) 180 MG 24 hr capsule take 1 capsule by mouth once daily 90 capsule 0  . metFORMIN (GLUCOPHAGE) 500 MG tablet take 1 tablet by mouth twice a day with  food 180 tablet 0  . metoprolol tartrate (LOPRESSOR) 25 MG tablet Take 0.5 tablets (12.5 mg total) by mouth 2 (two) times daily. 30 tablet 0  . Multiple Vitamin (MULTIVITAMIN) tablet Take 1 tablet by mouth daily.    Marland Kitchen OVER THE COUNTER MEDICATION Take 1 tablet by mouth at bedtime. "Leg cramps pill"    . rivaroxaban (XARELTO) 20 MG TABS tablet Take 1 tablet (20 mg total) by mouth daily with supper. 30 tablet 0  . senna (SENOKOT) 8.6 MG TABS tablet Take 2 tablets (17.2 mg total) by mouth daily. 60 each 0  . vitamin B-12 (CYANOCOBALAMIN) 1000 MCG tablet Take 1,000 mcg by mouth daily.    Marland Kitchen lisinopril (PRINIVIL,ZESTRIL) 10 MG tablet TAKE 1 TABLET BY MOUTH EVERY DAY (Patient not taking: Reported on 09/25/2016) 90 tablet 1   No current facility-administered medications on file prior to visit.    Allergies  Allergen Reactions  . Sulfa Antibiotics Hives   Family History  Problem Relation Age of Onset  . Cancer Father   . Heart attack Paternal Grandfather   . Kidney disease Paternal Aunt    Social History   Socioeconomic History  . Marital status: Single    Spouse name: None  . Number of children: None  . Years of education: None  . Highest education level: None  Social Needs  . Financial resource strain: None  . Food insecurity -  worry: None  . Food insecurity - inability: None  . Transportation needs - medical: None  . Transportation needs - non-medical: None  Occupational History  . None  Tobacco Use  . Smoking status: Never Smoker  . Smokeless tobacco: Never Used  Substance and Sexual Activity  . Alcohol use: No  . Drug use: No  . Sexual activity: None  Other Topics Concern  . None  Social History Narrative  . None   [facility] 2/9 04/16/2017 09/25/2016 07/31/2015 03/25/2015 05/24/2014  Decreased Interest 0 0 0 0 0  Down, Depressed, Hopeless 0 0 0 0 0  PHQ - 2 Score 0 0 0 0 0  Altered sleeping - - - - -  Tired, decreased energy - - - - -  Change in appetite - - -  - -  Feeling bad or failure about yourself  - - - - -  Trouble concentrating - - - - -  Moving slowly or fidgety/restless - - - - -  Suicidal thoughts - - - - -  PHQ-9 Score - - - - -      Review of Systems  All other systems reviewed and are negative.  ROS cannot be performed because pt answers "No honey" to every single questions. Denies any complaints/concerns    Objective:   Physical Exam  Constitutional: He is oriented to person, place, and time. He appears well-developed and well-nourished. No distress.  HENT:  Head: Normocephalic and atraumatic.  Eyes: Pupils are equal, round, and reactive to light. Conjunctivae are normal. No scleral icterus.  Neck: Normal range of motion. Neck supple. No thyromegaly present.  Cardiovascular: Normal rate, regular rhythm, normal heart sounds and intact distal pulses.  Pulmonary/Chest: Effort normal and breath sounds normal. No respiratory distress.  Musculoskeletal: He exhibits no edema.  Lymphadenopathy:    He has no cervical adenopathy.  Neurological: He is alert and oriented to person, place, and time.  Skin: Skin is warm and dry. He is not diaphoretic.  Psychiatric: He has a normal mood and affect. His behavior is normal.      BP 130/78 (BP Location: Right Arm, Patient Position: Sitting, Cuff Size: Large)   Pulse 91   Temp (!) 97.4 F (36.3 C) (Oral)   Resp 18   Ht 5\' 3"  (1.6 m)   Wt 203 lb 6.4 oz (92.3 kg)   SpO2 97%   BMI 36.03 kg/m      Assessment & Plan:   1. Type 2 diabetes mellitus with peripheral circulatory disorder (HCC) - a1c increased to 8 - increase metformin from 500 bid to 1g bid  2. Essential hypertension, benign - cont dilt 180 and metoprolol 12.5 bid, not taking lisinopril 10 so start losartan 25 since has DM  3. Atrial fibrillation, unspecified type (HCC) - cont xarelto 20 qd  4. Hyperlipidemia LDL goal <70 - cont lipitor 10  5. Anemia, unspecified type   6. Vitamin B12 deficiency - nml today  7.  Screening for prostate cancer   8. Screening for colorectal cancer   9. Need for prophylactic vaccination and inoculation against influenza   10. Chronic idiopathic constipation - pt denies but has had recurrent problems even leading to ER visits w/ severe pain - need to be very proactive w/ giving stools softeners - take every night w/ other meds as pt will not admit a problem or is completely unaware - not sure if he is truly able to remember/track BM  11.  Mental disability - congenital, mild - no formal testing/diagnosis ever done but pt cannot do any IADLs on own than can do ADLs. Lived most of life with relative by marriage who passed away and he is now staying with HER relatives who are in no way related to [facility] nor have any holding/attachment to him beyond promising his prior caregiver that they would "look after him" before she passed but they live in a small trailor home and his constant presence is causing escalating relationship fights - considering placing him in a nursing home but feel very guilty and unsure how to go about this -need help. Will place [facility] referral so their SW can guide caregivers through process/options - pt does listed that he is in the [facility] ACO registry  Orders Placed This Encounter  Procedures  . Flu Vaccine QUAD 36+ mos IM  . Comprehensive metabolic panel    Order Specific Question:   Has the patient fasted?    Answer:   Yes  . Lipid panel    Order Specific Question:   Has the patient fasted?    Answer:   Yes  . CBC with Differential/Platelet  . PSA  . Vitamin B12  . Ferritin  . Ambulatory referral to Gastroenterology    Referral Priority:   Routine    Referral Type:   Consultation    Referral Reason:   Specialty Services Required    Number of Visits Requested:   1  . AMB Referral to [facility] Care Management    Referral Priority:   Routine    Referral Type:   Consultation    Referral Reason:   Specialty Services Required    Number of Visits Requested:   1    . POCT glycosylated hemoglobin (Hb A1C)    Meds ordered this encounter  Medications  . losartan (COZAAR) 25 MG tablet    Sig: Take 0.5 tablets (12.5 mg total) by mouth daily.    Dispense:  45 tablet    Refill:  3  . metFORMIN (GLUCOPHAGE) 1000 MG tablet    Sig: Take 1 tablet (1,000 mg total) by mouth 2 (two) times daily with a meal.    Dispense:  180 tablet    Refill:  3  . senna-docusate (SENOKOT-S) 8.6-50 MG tablet    Sig: Take 4 tablets by mouth at bedtime.    Dispense:  360 tablet    Refill:  3  . diltiazem (TIAZAC) 180 MG 24 hr capsule    Sig: Take 1 capsule (180 mg total) by mouth daily.    Dispense:  90 capsule    Refill:  1     Norberto Sorenson, M.D.  Primary Care at [facility] [address] [place], [place] [address] [phone] phone [phone] fax  07/18/17 3:48 AM

## 2017-04-16 NOTE — Patient Instructions (Addendum)
     IF you received an x-ray today, you will receive an invoice from [facility]. Please contact  Radiology at [phone] with questions or concerns regarding your invoice.   IF you received labwork today, you will receive an invoice from [facility]. Please contact [facility] at [phone] with questions or concerns regarding your invoice.   Our billing staff will not be able to assist you with questions regarding bills from these companies.  You will be contacted with the lab results as soon as they are available. The fastest way to get your results is to activate your My Chart account. Instructions are located on the last page of this paperwork. If you have not heard from us regarding the results in 2 weeks, please contact this office.      Constipation, Adult Constipation is when a person has fewer bowel movements in a week than normal, has difficulty having a bowel movement, or has stools that are dry, hard, or larger than normal. Constipation may be caused by an underlying condition. It may become worse with age if a person takes certain medicines and does not take in enough fluids. Follow these instructions at home: Eating and drinking   Eat foods that have a lot of fiber, such as fresh fruits and vegetables, whole grains, and beans.  Limit foods that are high in fat, low in fiber, or overly processed, such as french fries, hamburgers, cookies, candies, and soda.  Drink enough fluid to keep your urine clear or pale yellow. General instructions  Exercise regularly or as told by your health care provider.  Go to the restroom when you have the urge to go. Do not hold it in.  Take over-the-counter and prescription medicines only as told by your health care provider. These include any fiber supplements.  Practice pelvic floor retraining exercises, such as deep breathing while relaxing the lower abdomen and pelvic floor relaxation during bowel movements.  Watch  your condition for any changes.  Keep all follow-up visits as told by your health care provider. This is important. Contact a health care provider if:  You have pain that gets worse.  You have a fever.  You do not have a bowel movement after 4 days.  You vomit.  You are not hungry.  You lose weight.  You are bleeding from the anus.  You have thin, pencil-like stools. Get help right away if:  You have a fever and your symptoms suddenly get worse.  You leak stool or have blood in your stool.  Your abdomen is bloated.  You have severe pain in your abdomen.  You feel dizzy or you faint. This information is not intended to replace advice given to you by your health care provider. Make sure you discuss any questions you have with your health care provider. Document Released: 10/17/2003 Document Revised: 08/08/2015 Document Reviewed: 07/09/2015 Elsevier Interactive Patient Education  2018 Elsevier Inc.  

## 2017-04-17 LAB — COMPREHENSIVE METABOLIC PANEL
A/G RATIO: 2 (ref 1.2–2.2)
ALBUMIN: 4.5 g/dL (ref 3.6–4.8)
ALT: 12 IU/L (ref 0–44)
AST: 15 IU/L (ref 0–40)
Alkaline Phosphatase: 91 IU/L (ref 39–117)
BUN/Creatinine Ratio: 16 (ref 10–24)
BUN: 15 mg/dL (ref 8–27)
Bilirubin Total: 0.4 mg/dL (ref 0.0–1.2)
CO2: 23 mmol/L (ref 20–29)
Calcium: 9.6 mg/dL (ref 8.6–10.2)
Chloride: 103 mmol/L (ref 96–106)
Creatinine, Ser: 0.92 mg/dL (ref 0.76–1.27)
GFR, EST AFRICAN AMERICAN: 98 mL/min/{1.73_m2} (ref >59)
GFR, EST NON AFRICAN AMERICAN: 85 mL/min/{1.73_m2} (ref >59)
GLOBULIN, TOTAL: 2.2 g/dL (ref 1.5–4.5)
Glucose: 177 mg/dL — ABNORMAL HIGH (ref 65–99)
POTASSIUM: 4.3 mmol/L (ref 3.5–5.2)
SODIUM: 141 mmol/L (ref 134–144)
TOTAL PROTEIN: 6.7 g/dL (ref 6.0–8.5)

## 2017-04-17 LAB — CBC WITH DIFFERENTIAL/PLATELET
BASOS ABS: 0 10*3/uL (ref 0.0–0.2)
Basos: 0 %
EOS (ABSOLUTE): 0.2 10*3/uL (ref 0.0–0.4)
Eos: 2 %
Hematocrit: 38.7 % (ref 37.5–51.0)
Hemoglobin: 12.4 g/dL — ABNORMAL LOW (ref 13.0–17.7)
IMMATURE GRANS (ABS): 0.1 10*3/uL (ref 0.0–0.1)
Immature Granulocytes: 1 %
LYMPHS ABS: 1.5 10*3/uL (ref 0.7–3.1)
LYMPHS: 15 %
MCH: 29 pg (ref 26.6–33.0)
MCHC: 32 g/dL (ref 31.5–35.7)
MCV: 90 fL (ref 79–97)
Monocytes Absolute: 0.8 10*3/uL (ref 0.1–0.9)
Monocytes: 8 %
Neutrophils Absolute: 7.4 10*3/uL — ABNORMAL HIGH (ref 1.4–7.0)
Neutrophils: 74 %
PLATELETS: 283 10*3/uL (ref 150–379)
RBC: 4.28 x10E6/uL (ref 4.14–5.80)
RDW: 13.8 % (ref 12.3–15.4)
WBC: 10 10*3/uL (ref 3.4–10.8)

## 2017-04-17 LAB — FERRITIN: Ferritin: 50 ng/mL (ref 30–400)

## 2017-04-17 LAB — VITAMIN B12: VITAMIN B 12: 618 pg/mL (ref 232–1245)

## 2017-04-17 LAB — LIPID PANEL
CHOL/HDL RATIO: 3.7 ratio (ref 0.0–5.0)
Cholesterol, Total: 146 mg/dL (ref 100–199)
HDL: 40 mg/dL (ref >39)
LDL Calculated: 67 mg/dL (ref 0–99)
Triglycerides: 195 mg/dL — ABNORMAL HIGH (ref 0–149)
VLDL Cholesterol Cal: 39 mg/dL (ref 5–40)

## 2017-04-17 LAB — PSA: PROSTATE SPECIFIC AG, SERUM: 1.2 ng/mL (ref 0.0–4.0)

## 2017-05-19 ENCOUNTER — Encounter: Payer: Self-pay | Admitting: Family Medicine

## 2017-07-16 ENCOUNTER — Telehealth: Payer: Self-pay | Admitting: Family Medicine

## 2017-07-16 NOTE — Telephone Encounter (Signed)
Copied from CRM [id number]#[id number]. Topic: General - Other >> Jul 15, 2017  7:57 AM Gerrianne ScalePayne, Angela L wrote: Reason for CRM: Wess BottsBeth Moore pt cousin calling stating that DR Clelia CroftShaw wanted her to call Dr Clelia CroftShaw to find out what they need to do to put him into a home you can reach her after 3 at [phone] because she is at work

## 2017-07-17 NOTE — Telephone Encounter (Signed)
Message re: putting pt in home

## 2017-07-18 NOTE — Telephone Encounter (Signed)
[facility] - hopefully they have a SW who can help with guidance. If they are not able - will place C3/connected care referral. Also, advise [name] to call pt's insurance to find out what the long-term care benefits/options are and if they cover any particular places or types of places better than others

## 2017-07-19 NOTE — Telephone Encounter (Signed)
Spoke with Waynetta SandyBeth she was advised and voiced understanding

## 2017-08-18 ENCOUNTER — Encounter: Payer: Self-pay | Admitting: *Deleted

## 2017-08-18 ENCOUNTER — Other Ambulatory Visit: Payer: Self-pay | Admitting: *Deleted

## 2017-08-18 NOTE — Patient Outreach (Signed)
[facility](THN) Care Management  08/18/2017  Julienne Kassorman H Shorkey March 20, 1945 [id number]  MD Referral: Reason:Needs SW consult to guide placement options & approach; currently living with relatives of his deceased long term caregiver; need help finding long term care option for him with limited resources/support. Dx DM, unspecified mild-mod congenital mental disability.  Telephone call to patient/caregiver; HIPPa compliant voice mail requesting return call.  Plan:  Furniture conservator/restorerend outreach letter. Follow up 2-4 business days.  Colleen CanLinda Loree Shehata, RN BSN CCM Care Management Coordinator [facility]  [phone]

## 2017-08-22 ENCOUNTER — Other Ambulatory Visit: Payer: Self-pay | Admitting: *Deleted

## 2017-08-22 NOTE — Patient Outreach (Signed)
[facility](THN) Care Management  08/22/2017  Julienne Kassorman H Meloche May 29, 1945 [id number]   MD Referral: Reason:Needs SW consult to guide placement options & approach; currently living with relatives of his deceased long term caregiver; need help finding long term care option for him with limited resources/support. Dx DM, unspecified mild-mod. congenital mental disability.  Telephone call to patient; person that he lives with [name]([name]) answered call and advised that patient was not home. States patient would be home later.  Plan: Will follow up. Send outreach.  Colleen CanLinda Khadir Roam, RN BSN CCM Care Management Coordinator [facility] Care [facility]  [phone]

## 2017-08-24 ENCOUNTER — Encounter: Payer: Self-pay | Admitting: *Deleted

## 2017-08-25 ENCOUNTER — Other Ambulatory Visit: Payer: Self-pay | Admitting: *Deleted

## 2017-08-25 NOTE — Patient Outreach (Signed)
[facility](THN) Care Management  08/25/2017  Nicholas Hardin 08-08-1945 [id number]  MD Referral:  Reason:Needs SW consult to guide placement options & approach; currently living with relatives of his deceased long term caregiver; need help finding long term care option for him with limited resources/support. Dx DM, unspecified mild-mod. congenital mental disability.  Telephone call #3; left message on voice mail requesting return call.  Plan> Will follow up. Outreach letter sent 08/24/2017.  Colleen CanLinda Kaydince Towles, RN BSN CCM Care Management Coordinator [facility]  [phone]

## 2017-08-29 ENCOUNTER — Ambulatory Visit: Payer: Self-pay | Admitting: *Deleted

## 2017-08-29 DIAGNOSIS — I481 Persistent atrial fibrillation: Secondary | ICD-10-CM | POA: Diagnosis not present

## 2017-08-29 DIAGNOSIS — I10 Essential (primary) hypertension: Secondary | ICD-10-CM | POA: Diagnosis not present

## 2017-08-30 ENCOUNTER — Other Ambulatory Visit: Payer: Self-pay | Admitting: *Deleted

## 2017-08-30 NOTE — Patient Outreach (Signed)
[facility](THN) Care Management  08/30/2017  Julienne Kassorman H Kamiya May 30, 1945 [id number]  Reason:Needs SW consult to guide placement options & approach; currently living with relatives of his deceased long term caregiver; need help finding long term care option for him with limited resources/support. Dx DM, unspecified mild-mod.congenital mental disability  Telephone call attempt to patient/caregiver; left hippa compliant voice mail requesting return call.  Plan: Will follow up.  Colleen CanLinda Marquail Bradwell, RN BSN CCM Care Management Coordinator [facility]  [phone]

## 2017-08-31 ENCOUNTER — Ambulatory Visit: Payer: Self-pay | Admitting: *Deleted

## 2017-09-01 ENCOUNTER — Other Ambulatory Visit: Payer: Self-pay | Admitting: *Deleted

## 2017-09-01 NOTE — Patient Outreach (Signed)
[place] [facility]) Care Management  09/01/2017  [name] 05-10-1945 [id number]  Reason:Needs SW consult to guide placement options & approach; currently living with relatives of his deceased long term caregiver; need help finding long term care option for him with limited resources/support. Dx DM, unspecified mild-mod.congenital mental disability  Telephone call to patient who gave caregiver permission to speak with this care coordinator about his concerns. Hippa verification received. Spoke with Remer Macho who is patient's cousin in law. [name] states she & her spouse (pt's cousin) have been his caregivers for 5 years.  States she and her spouse still work outside the home and patient is usually home alone. States in last 5-6 months patient has become unsteady & off balance. States patient is on blood thinners and he is at risk for falling.   [name](caregiver) states she & her husband are no longer able to care for patient and make sure he is safe while they are working. States she is having health issues of her own. Voices that she & her husband would like to have him placed in home or facility where his needs are met and he is safe from falling.  States they need assistance with finding long term care for him.  States she is not sure if patient has Medicaid.   Caregiver consents to [facility] services regarding long term care options for patient.  Plan: Referral to Clinical Social Worker for assistance with long term care options for patient. Telephonic care coordinator signing off.   Sherrin Daisy, RN BSN [place] Management Coordinator [facility] Care Management  [phone]

## 2017-09-09 ENCOUNTER — Other Ambulatory Visit: Payer: Self-pay | Admitting: *Deleted

## 2017-09-09 NOTE — Patient Outreach (Signed)
[facility](THN) Care Management  09/09/2017  Nicholas Hardin 07/01/1945 [id number]   CSW was able to make initial phone outreach with Nicholas BottsBeth Hardin, pt's primary spokesperson as pt has mental retardation.  CSW confirmed pt's identity and introduced self and role. CSW inquired about pt needs and per family, pt needs more assistance than family can manage or provide in the home setting. Pt has been living with cousin Nicholas Hardin(Nicholas Hardin and his wife Nicholas BottsBeth Hardin) since his mother passed away. Both Nicholas HiddenGary and Nicholas SandyBeth work so pt is home alone a lot. Their concerns are heightening due to him stumbling, on blood thinners and home alone most of the day.  Nicholas reports he is a high functioning person with MR and is mobile, independent with ADL's.  CSW discussed possible options for placement; to include Assisted Living or a family care home. Pt has limited income and should be eligible for long term Medicaid.  Advised family we can begin to work on investigating and pursuing a placement and that it would take some time. Nicholas understands.  CSW will contact pt's PCP to begin [facility] completion and plan f/u call to family next week.  Nicholas LevyJanet Alezander Hardin, MSW, LCSW Clinical Social Worker  [facility] [phone]

## 2017-09-15 ENCOUNTER — Other Ambulatory Visit: Payer: Self-pay | Admitting: *Deleted

## 2017-09-15 ENCOUNTER — Telehealth: Payer: Self-pay | Admitting: Family Medicine

## 2017-09-15 NOTE — Telephone Encounter (Signed)
Copied from CRM [id number]. Topic: General - Other >> Sep 15, 2017 10:25 AM Gean BirchwoodWilliams-Neal, Sade R wrote: Reece LevyJanet Caldwell from [facility] is calling in requesting a call back from Dr Clelia CroftShaw regarding pts needs   Cb# [phone]

## 2017-09-15 NOTE — Patient Outreach (Signed)
[facility](THN) Care Management  09/15/2017  Julienne Kassorman H Croak 05-Jun-1945 [id number]   CSW spoke with Wess BottsBeth Moore today who reports no updates on placement info or process.  CSW has left a message by email and phone for PCP office to discuss placement and needs (FL2, etc).  CSW will have info mailed to pt and family for ALF options to be consider.  CSW will call PCP back tomorrow if no return call received.    Reece LevyJanet Temperence Zenor, MSW, LCSW Clinical Social Worker  [facility] [phone]

## 2017-09-15 NOTE — Patient Outreach (Signed)
[facility](THN) Care Management  09/15/2017  Nicholas Hardin 01-08-1946 [id number]  BSW has mailed community resources to the patients home address at the attention of Wess BottsBeth Moore as requested by CSW, Reece LevyJanet Caldwell. Resources mailed include information on [facility] as well as a list of adult care homes and [place] care homes [place]. Both lists were obtained via DHHS.  Bevelyn NgoKendra Sidonia Nutter, BSW, CDP [facility] Care Management Social Worker [phone]

## 2017-09-20 ENCOUNTER — Telehealth: Payer: Self-pay | Admitting: Family Medicine

## 2017-09-20 NOTE — Telephone Encounter (Signed)
Copied from CRM [id number]. Topic: General - Other >> Sep 20, 2017  9:58 AM Percival SpanishKennedy, Cheryl W wrote:  Marylu LundJanet a social worker with [facility] req a call back about pt long term needs   [phone]

## 2017-09-21 ENCOUNTER — Other Ambulatory Visit: Payer: Self-pay | Admitting: *Deleted

## 2017-09-21 NOTE — Patient Outreach (Signed)
[facility]N) Care Management  09/21/2017  Julienne Kassorman H Ines 1945-02-07 [id number]   CSW made a phone call to PCP and left message for callback. CSW also contacted pt's family and left a message regarding resources mailed and awaiting call from PCP.  CSW will plan to stop by PCP's office later this week if no return call is received.   Reece LevyJanet Nirav Sweda, MSW, LCSW Clinical Social Worker  [facility] [phone]

## 2017-10-04 ENCOUNTER — Telehealth: Payer: Self-pay | Admitting: Family Medicine

## 2017-10-04 NOTE — Telephone Encounter (Signed)
[name] came and dropped off a fl2 form for long term placement for pt, for Dr. Clelia Croft to complete and sign. Caitlin placed it in Dr. Clelia Croft box at 104 on 10/04/17. Its a big white envelope. Call Marylu Lund at [phone] when form is completed and ready for pick up.

## 2017-10-10 ENCOUNTER — Telehealth: Payer: Self-pay | Admitting: Family Medicine

## 2017-10-10 NOTE — Telephone Encounter (Signed)
Copied from CRM [id number]. Topic: Quick Communication - See Telephone Encounter >> Oct 10, 2017  1:54 PM Floria Raveling A wrote: CRM for notification. See Telephone encounter for: 10/10/17.  Marylu Lund with [facility] called and would like to speak with Dr Clelia Croft CMA regarding the [facility] for that she dropped off last week?    Best number -[phone]

## 2017-10-13 ENCOUNTER — Other Ambulatory Visit: Payer: Self-pay | Admitting: *Deleted

## 2017-10-13 ENCOUNTER — Ambulatory Visit: Payer: Self-pay | Admitting: *Deleted

## 2017-10-13 NOTE — Telephone Encounter (Signed)
Leretha PolSantiago says that Clelia CroftShaw will have to complete this

## 2017-10-13 NOTE — Telephone Encounter (Signed)
[facility] [phone]  Called in to follow up on FL2 form. Spoke with Carollee HerterShannon in the office, she will forward message to covering PCP Leretha Pol(Santiago) and have CMA to follow up with Nicholas Hardin.

## 2017-10-17 ENCOUNTER — Other Ambulatory Visit: Payer: Self-pay | Admitting: *Deleted

## 2017-10-17 NOTE — Patient Outreach (Signed)
[facility](THN) Care Management  10/17/2017  Nicholas Hardin [birth year] [id number]   CSW contacted pt's PCP office again today to inquire about the status of my requests for follow up call. CSW spoke with Print production plannerffice Manager, who states she will look into it and call me back. CSW stressed the need for the FL2 to be completed for facility placement/search to begin.   CSW has also updated pt's family and will await callback from PCP office with updates.   Reece LevyJanet Carlynn Leduc, MSW, LCSW Clinical Social Worker  [facility] [phone]

## 2017-10-18 ENCOUNTER — Other Ambulatory Visit: Payer: Self-pay

## 2017-10-19 ENCOUNTER — Other Ambulatory Visit: Payer: Self-pay | Admitting: *Deleted

## 2017-10-19 ENCOUNTER — Ambulatory Visit: Payer: Self-pay | Admitting: *Deleted

## 2017-10-20 ENCOUNTER — Ambulatory Visit: Payer: Self-pay | Admitting: *Deleted

## 2017-10-21 ENCOUNTER — Ambulatory Visit: Payer: Self-pay | Admitting: *Deleted

## 2017-10-24 ENCOUNTER — Encounter: Payer: Self-pay | Admitting: *Deleted

## 2017-10-24 ENCOUNTER — Other Ambulatory Visit: Payer: Self-pay | Admitting: *Deleted

## 2017-10-24 ENCOUNTER — Ambulatory Visit: Payer: Self-pay | Admitting: *Deleted

## 2017-10-24 NOTE — Patient Outreach (Signed)
[facility](THN) Care Management  10/24/2017  Julienne Kassorman H Crader [birth year] [id number]  BSW mailed the patient a Medicaid application as requested by CSW, Reece LevyJanet Caldwell.  Bevelyn NgoKendra Yianna Tersigni, BSW, CDP [facility] Care Management Social Worker [phone]

## 2017-10-24 NOTE — Patient Outreach (Signed)
[place] [facility]) Care Management  [facility] Social Work  10/24/2017  Nicholas Hardin January 23, 1946 [id number]  Subjective:  "She said you were gonna talk to me about new places to live"  Objective: [facility] CSW to assist patient and family with community based resources to aide in their well-being, quality of life and overall safety and needs.    Encounter Medications:  Outpatient Encounter Medications as of 10/24/2017  Medication Sig Note  . atorvastatin (LIPITOR) 10 MG tablet Take 10 mg by mouth daily.    Marland Kitchen diltiazem (TIAZAC) 180 MG 24 hr capsule Take 1 capsule (180 mg total) by mouth daily.   Marland Kitchen losartan (COZAAR) 25 MG tablet Take 0.5 tablets (12.5 mg total) by mouth daily. 10/24/2017: Pill crumbles when splitting to take one half so RPH instructed to take 1 pill every other day.  . metFORMIN (GLUCOPHAGE) 1000 MG tablet Take 1 tablet (1,000 mg total) by mouth 2 (two) times daily with a meal.   . metoprolol tartrate (LOPRESSOR) 25 MG tablet Take 0.5 tablets (12.5 mg total) by mouth 2 (two) times daily. (Patient taking differently: Take 12.5 mg by mouth 3 (three) times daily. )   . Multiple Vitamin (MULTIVITAMIN) tablet Take 1 tablet by mouth daily.   . rivaroxaban (XARELTO) 20 MG TABS tablet Take 1 tablet (20 mg total) by mouth daily with supper.   . senna-docusate (SENOKOT-S) 8.6-50 MG tablet Take 4 tablets by mouth at bedtime.   Marland Kitchen aspirin 81 MG chewable tablet Chew 81 mg by mouth at bedtime.   Marland Kitchen OVER THE COUNTER MEDICATION Take 1 tablet by mouth at bedtime. "Leg cramps pill"   . vitamin B-12 (CYANOCOBALAMIN) 1000 MCG tablet Take 1,000 mcg by mouth daily.    No facility-administered encounter medications on file as of 10/24/2017.     Functional Status:  In your present state of health, do you have any difficulty performing the following activities: 10/24/2017 04/16/2017  Hearing? [name]  Vision? Y N  Difficulty concentrating or making decisions? [name]  Walking or climbing stairs? N Y   Comment cannot do stairs -  Dressing or bathing? N N  Doing errands, shopping? [name]  Preparing Food and eating ? N -  Using the Toilet? Y -  In the past six months, have you accidently leaked urine? N -  Do you have problems with loss of bowel control? Y -  Managing your Medications? Y -  Managing your Finances? Y -  Some recent data might be hidden    Fall/Depression Screening:  PHQ 2/9 Scores 10/24/2017 04/16/2017 09/25/2016 07/31/2015 03/25/2015 05/24/2014 09/21/2013  PHQ - 2 Score 1 0 0 0 0 0 0  PHQ- 9 Score - - - - - - -    Assessment: CSW met with pt and his family Nicholas Hardin, in their home today. Pt was alert, oriented, pleasant and agreeable to considering a long term placement facility. Pt is a high functioning mentally challenged individual. He is very independent and able but has had some falls and is on a blood thinner. Family is concerned about his walking about as he loves to take walks outside; "he goes through about ten pair of shoes each year".  He does not wander but is home alone all day and thus they are interested in pursuing placement.    Plan:   [employer] CM Care Plan Problem One     Most Recent Value  Care Plan Problem One  Patient needs more supervision in  the home setting and is open to placement.   Role Documenting the Problem One  Clinical Social Worker  Care Plan for Problem One  Active  [facility] CM Short Term Goal #1   Family will confirm completion of paperwork (FL2. Medicaid) to pursue placement options in the next 30 days.   [facility] CM Short Term Goal #1 Start Date  10/24/17  Interventions for Short Term Goal #1  CSW assisting with FL2, Medicaid and search for alternative (ALF) housing. Pt agreeable to this plan.   [facility] CM Short Term Goal #2   Patient will have  TB skin test completed and read for ALF placement in the next 30 days.   [facility] CM Short Term Goal #2 Start Date  10/24/17  Interventions for Short Term Goal #2  CSW advised pt and family of need for MD to  complete a TB screening. CSW has encouraged family to arrange appointment for skin test asap.   [facility] CM Short Term Goal #3  Family will review, tour and inquire with area ALF's for placement preferences.   [facility] CM Short Term Goal #3 Start Date  10/24/17  Interventions for Short Tern Goal #3  CSW provided family with list of area ALF's to consider. CSW reviewed list and will reach out to their preferences in the ntxt 30 days.       CSW will mail a Medicaid application to them for completion and assist with seeking options for placement and call with updates as progress is made.   Eduard Clos, MSW, [place] Worker  [place] [phone]

## 2017-11-01 ENCOUNTER — Other Ambulatory Visit: Payer: Self-pay | Admitting: *Deleted

## 2017-11-01 NOTE — Patient Outreach (Signed)
[facility]) Care Management  11/01/2017  Nicholas Hardin May 26, 1945 [id number]   CSW was able to make contact with family today by phone and confirmed they did receive the paperwork mailed to them. They have not had a chance to complete the paperwork and are hopeful to work on this during the week.  CSW will continue to assist them with ALF placement and update as progress is made.   [name], MSW, LCSW Clinical Social Worker  [facility] [phone]

## 2017-11-07 ENCOUNTER — Other Ambulatory Visit: Payer: Self-pay | Admitting: *Deleted

## 2017-11-07 NOTE — Patient Outreach (Signed)
[place] [facility]) Care Management  [facility] Social Work  11/07/2017  LILTON PARE [birth year] [id number]  Subjective:  "I completed and mailed the Medicaid application".  Objective: [facility] CSW to assist patient and family with community based resources to aide in their well-being, quality of life and overall safety and needs.    Encounter Medications:  Outpatient Encounter Medications as of 11/07/2017  Medication Sig Note  . aspirin 81 MG chewable tablet Chew 81 mg by mouth at bedtime.   Marland Kitchen atorvastatin (LIPITOR) 10 MG tablet Take 10 mg by mouth daily.    Marland Kitchen diltiazem (TIAZAC) 180 MG 24 hr capsule Take 1 capsule (180 mg total) by mouth daily.   Marland Kitchen losartan (COZAAR) 25 MG tablet Take 0.5 tablets (12.5 mg total) by mouth daily. 10/24/2017: Pill crumbles when splitting to take one half so RPH instructed to take 1 pill every other day.  . metFORMIN (GLUCOPHAGE) 1000 MG tablet Take 1 tablet (1,000 mg total) by mouth 2 (two) times daily with a meal.   . metoprolol tartrate (LOPRESSOR) 25 MG tablet Take 0.5 tablets (12.5 mg total) by mouth 2 (two) times daily. (Patient taking differently: Take 12.5 mg by mouth 3 (three) times daily. )   . Multiple Vitamin (MULTIVITAMIN) tablet Take 1 tablet by mouth daily.   Marland Kitchen OVER THE COUNTER MEDICATION Take 1 tablet by mouth at bedtime. "Leg cramps pill"   . rivaroxaban (XARELTO) 20 MG TABS tablet Take 1 tablet (20 mg total) by mouth daily with supper.   . senna-docusate (SENOKOT-S) 8.6-50 MG tablet Take 4 tablets by mouth at bedtime.   . vitamin B-12 (CYANOCOBALAMIN) 1000 MCG tablet Take 1,000 mcg by mouth daily.    No facility-administered encounter medications on file as of 11/07/2017.     Functional Status:  In your present state of health, do you have any difficulty performing the following activities: 10/24/2017 04/16/2017  Hearing? [name]  Vision? Y N  Difficulty concentrating or making decisions? [name]  Walking or climbing stairs? N Y  Comment cannot  do stairs -  Dressing or bathing? N N  Doing errands, shopping? [name]  Preparing Food and eating ? N -  Using the Toilet? Y -  In the past six months, have you accidently leaked urine? N -  Do you have problems with loss of bowel control? Y -  Managing your Medications? Y -  Managing your Finances? Y -  Some recent data might be hidden    Fall/Depression Screening:  PHQ 2/9 Scores 10/24/2017 04/16/2017 09/25/2016 07/31/2015 03/25/2015 05/24/2014 09/21/2013  PHQ - 2 Score 1 0 0 0 0 0 0  PHQ- 9 Score - - - - - - -    Assessment:  [name] reports pt is "about the same".  CSW discussed next steps and encouraged her to begin ALF research/visits/tours to determine their top choices for ALF.    Plan:  [facility] CM Care Plan Problem One     Most Recent Value  Care Plan Problem One  Patient needs more supervision in the home setting and is open to placement.   Role Documenting the Problem One  Clinical Social Worker  Care Plan for Problem One  Active  [facility] CM Short Term Goal #1   Family will confirm completion of paperwork (FL2. Medicaid) to pursue placement options in the next 30 days.   [facility] CM Short Term Goal #1 Start Date  10/24/17  [facility] CM Short Term Goal #1 Met Date  11/07/17  Interventions for Short Term Goal #1  Pt's family reports they have completed the Medicaid application and mailed into DSS.   [facility] CM Short Term Goal #2   Patient will have  TB skin test completed and read for ALF placement in the next 30 days.   [facility] CM Short Term Goal #2 Start Date  10/24/17  [facility] CM Short Term Goal #3  Family will review, tour and inquire with area ALF's for placement preferences.   [facility] CM Short Term Goal #3 Start Date  10/24/17  Interventions for Short Tern Goal #3  Family encouraged to tour ALF's that they are interested in for search.        CSW will follow up in 2-3 weeks for updates on the Medicaid and ALF tours.     Eduard Clos, MSW, [place] Worker  [place] [phone]

## 2017-11-21 ENCOUNTER — Other Ambulatory Visit: Payer: Self-pay | Admitting: *Deleted

## 2017-11-21 NOTE — Patient Outreach (Signed)
[facility]) Care Management  11/21/2017  Nicholas Hardin [birth year] [id number]    CSW received phone call from pt's sister today who reports she has been to DSS and applied for Medicaid. "I need to get power of attorney for his life insurance company to talk to me".  CSW provided her with some options for pursuing Durable and Health Care POA.  CSW also encouraged her to visit/tour area ALF's to determine preferences.  CSW will call DSS worker and fax FL2 form to her.   CSW will plan a f/u call to DSS and pt's sister in the next 2 weeks for updates on status.   [name], MSW, LCSW Clinical Social Worker  [facility] [phone]

## 2017-11-22 ENCOUNTER — Ambulatory Visit: Payer: Self-pay | Admitting: *Deleted

## 2017-12-05 ENCOUNTER — Other Ambulatory Visit: Payer: Self-pay | Admitting: *Deleted

## 2017-12-06 NOTE — Patient Outreach (Signed)
[facility]) Care Management  12/06/2017  Nicholas Hardin 1945/09/26 [id number]   CSW was able to make contact with Medicaid worker, Gwenith Daily who reports the application is pending. CSW also left a message for family to update on their investigation of ALF's and left a message for [name] to call back.  CSW will plan to call family again next week if no return call is received.    [name], MSW, LCSW Clinical Social Worker  [facility] [phone]

## 2017-12-11 ENCOUNTER — Other Ambulatory Visit: Payer: Self-pay | Admitting: Family Medicine

## 2017-12-12 ENCOUNTER — Other Ambulatory Visit: Payer: Self-pay | Admitting: *Deleted

## 2017-12-12 NOTE — Patient Outreach (Signed)
[facility]) Care Management  12/12/2017  Nicholas Hardin 03-Jun-1945 [id number]   CSW spoke with pt's family, Waynetta Sandy, by phone who reports she has taking steps to spend down pt's assets to process the Medicaid. She has filed to cash in an Financial controller per [employer] staff guidance.  Family plans to contact CSW with updates and to proceed with plans as they are ready and able with Medicaid.  CSW will plan a call in 2-3 weeks for update if no contact made before.  [name], MSW, LCSW Clinical Social Worker  [facility] [phone]

## 2017-12-12 NOTE — Telephone Encounter (Signed)
Requested medication (s) are due for refill today: Yes  Requested medication (s) are on the active medication list: Yes  Last refill:  04/06/17  Future visit scheduled: No  Notes to clinic:  Unable to refill per protocol     Requested Prescriptions  Pending Prescriptions Disp Refills   diltiazem (TIAZAC) 180 MG 24 hr capsule [Pharmacy Med Name: DILTIAZEM ER 180MG  CAPSULES (24 HR)] 90 capsule 0    Sig: TAKE 1 CAPSULE BY MOUTH ONCE DAILY     Off-Protocol Failed - 12/11/2017  1:28 PM      Failed - Medication not assigned to a protocol, review manually.      Passed - Valid encounter within last 12 months    Recent Outpatient Visits          8 months ago Type 2 diabetes mellitus with peripheral circulatory disorder [facility])   [facility], Levell July, MD   1 year ago Essential hypertension, benign   [facility], Levell July, MD   2 years ago Type 2 diabetes mellitus without complication, without long-term current use of insulin [facility])   [facility], Levell July, MD   2 years ago Medicare annual wellness visit, subsequent   [facility], Levell July, MD   3 years ago Metabolic syndrome   [facility], Levell July, MD

## 2018-01-02 ENCOUNTER — Ambulatory Visit: Payer: Self-pay | Admitting: *Deleted

## 2018-01-02 ENCOUNTER — Other Ambulatory Visit: Payer: Self-pay | Admitting: *Deleted

## 2018-01-02 NOTE — Patient Outreach (Signed)
[facility]) Care Management  01/02/2018  Nicholas Hardin July 06, 1945 [id number]   CSW was unable to reach pt/family by phone today. A voice message was left for daughter. CSW will attempt outreach again in 3-4 business days if no return call is received.   Reece LevyJanet Sophronia Varney, MSW, LCSW Clinical Social Worker  [facility] [phone]

## 2018-01-06 ENCOUNTER — Other Ambulatory Visit: Payer: Self-pay | Admitting: *Deleted

## 2018-01-06 ENCOUNTER — Ambulatory Visit: Payer: Self-pay | Admitting: *Deleted

## 2018-01-06 NOTE — Patient Outreach (Signed)
[facility]) Care Management  01/06/2018  Nicholas Hardin [birth year] [id number]   CSW left voice message for family and will try again if no return call received.  Reece LevyJanet Luster Hechler, MSW, LCSW Clinical Social Worker  [facility] [phone]

## 2018-01-09 ENCOUNTER — Other Ambulatory Visit: Payer: Self-pay | Admitting: *Deleted

## 2018-01-09 NOTE — Patient Outreach (Signed)
[facility](THN) Care Management  01/09/2018  Nicholas Hardin 10-01-1945 [id number]   CSW spoke with pt's sister who reports the Medicaid application was denied by DSS because "it was late getting there even though I sent them to info requested the day after receiving it".  CSW offered to mail her another application and encouraged her to apply before the end of the year in hopes of getting it processed quicker.   She reports that the pt is doing well; goes to church, shopping, etc and no issues or concerns.   CSW offered to f/u later in the month and advised her to call me if needs arise.    Nicholas LevyJanet Daemian Hardin, MSW, LCSW Clinical Social Worker  [facility] [phone]

## 2018-01-10 NOTE — Patient Outreach (Signed)
[facility]) Care Management  01/10/2018  Nicholas Hardin 04-Aug-1945 [id number]  BSW to mail resources to the patients home as requested by CSW Reece LevyJanet Caldwell.  Bevelyn NgoKendra Garcia Dalzell, BSW, CDP [facility] Care Management Social Worker [phone]

## 2018-01-19 ENCOUNTER — Other Ambulatory Visit: Payer: Self-pay | Admitting: Family Medicine

## 2018-01-19 NOTE — Telephone Encounter (Signed)
Requested Prescriptions  Pending Prescriptions Disp Refills  . diltiazem (TIAZAC) 180 MG 24 hr capsule [Pharmacy Med Name: DILTIAZEM ER 180MG  CAPSULES (24 HR)] 90 capsule 0    Sig: TAKE 1 CAPSULE BY MOUTH EVERY DAY     Off-Protocol Failed - 01/19/2018  3:19 AM      Failed - Medication not assigned to a protocol, review manually.      Passed - Valid encounter within last 12 months    Recent Outpatient Visits          9 months ago Type 2 diabetes mellitus with peripheral circulatory disorder [facility](HCC)   [facility], [place]ell JulyEva N, MD   1 year ago Essential hypertension, benign   [facility], Levell JulyEva N, MD   2 years ago Type 2 diabetes mellitus without complication, without long-term current use of insulin [facility](HCC)   [facility], Levell JulyEva N, MD   2 years ago Medicare annual wellness visit, subsequent   [facility] Levell JulyEva N, MD   3 years ago Metabolic syndrome   [facility], Levell JulyEva N, MD

## 2018-01-23 ENCOUNTER — Ambulatory Visit: Payer: Self-pay | Admitting: *Deleted

## 2018-01-24 ENCOUNTER — Other Ambulatory Visit: Payer: Self-pay | Admitting: *Deleted

## 2018-01-24 NOTE — Patient Outreach (Signed)
[facility]N) Care Management  01/24/2018  Julienne Kassorman H Bily 1945/08/03 [id number]   CSW attempted to reach pt's family for update. A voice message was left and CSW will plan a f/u attempt next week if no return call is received.  Reece LevyJanet Milicent Acheampong, MSW, LCSW Clinical Social Worker  [facility] [phone]

## 2018-01-30 ENCOUNTER — Ambulatory Visit: Payer: Self-pay | Admitting: *Deleted

## 2018-02-13 ENCOUNTER — Other Ambulatory Visit: Payer: Self-pay | Admitting: *Deleted

## 2018-02-13 NOTE — Patient Outreach (Signed)
[facility]) Care Management  02/13/2018  CAINE BARFIELD 1945-08-07 [id number]   CSW spoke with pt's family member, Eustaquio Maize, who reports things are going well.  She has not completed another Medicaid application yet (she reported DSS has not received the mailed application). She shared that she plans to "Spend down" his [employer] before reapplying. [name] also indicates her husband has some health issues.  CSW discussed goals and plans and will plan case closure at this time. [name] will plan to outreach CSW if questions or further needs arise- she is aware of what steps to take going further with placement for pt but is assured CSW can assist if needs arise.     [facility] CM Care Plan Problem One     Most Recent Value  Care Plan Problem One  Patient needs more supervision in the home setting and is open to placement.   Role Documenting the Problem One  Clinical Social Worker  Care Plan for Problem One  Not Active  [facility] CM Short Term Goal #1   Family will confirm completion of paperwork ([place]2. Medicaid) to pursue placement options in the next 30 days.   [facility] CM Short Term Goal #1 Start Date  10/24/17  [facility] CM Short Term Goal #1 Met Date  11/07/17  Interventions for Short Term Goal #1  PLans to reapply in the new year.       CSW will close referral and advise [facility] team and PCP.     Eduard Clos, MSW, [place] Worker  [place] [phone]

## 2018-04-04 ENCOUNTER — Other Ambulatory Visit: Payer: Self-pay | Admitting: Family Medicine

## 2018-04-17 ENCOUNTER — Emergency Department (HOSPITAL_COMMUNITY): Payer: Medicare Other

## 2018-04-17 ENCOUNTER — Encounter (HOSPITAL_COMMUNITY): Payer: Self-pay | Admitting: Emergency Medicine

## 2018-04-17 ENCOUNTER — Inpatient Hospital Stay (HOSPITAL_COMMUNITY)
Admission: EM | Admit: 2018-04-17 | Discharge: 2018-04-22 | DRG: 602 | Disposition: A | Discharge status: Skilled Nursing Facility | Payer: Medicare Other | Attending: Internal Medicine | Admitting: Internal Medicine

## 2018-04-17 DIAGNOSIS — L03115 Cellulitis of right lower limb: Secondary | ICD-10-CM | POA: Diagnosis not present

## 2018-04-17 DIAGNOSIS — Z7984 Long term (current) use of oral hypoglycemic drugs: Secondary | ICD-10-CM

## 2018-04-17 DIAGNOSIS — Z882 Allergy status to sulfonamides status: Secondary | ICD-10-CM | POA: Diagnosis not present

## 2018-04-17 DIAGNOSIS — R1110 Vomiting, unspecified: Secondary | ICD-10-CM | POA: Diagnosis not present

## 2018-04-17 DIAGNOSIS — E1143 Type 2 diabetes mellitus with diabetic autonomic (poly)neuropathy: Secondary | ICD-10-CM | POA: Diagnosis present

## 2018-04-17 DIAGNOSIS — Z79899 Other long term (current) drug therapy: Secondary | ICD-10-CM

## 2018-04-17 DIAGNOSIS — I517 Cardiomegaly: Secondary | ICD-10-CM | POA: Diagnosis not present

## 2018-04-17 DIAGNOSIS — E876 Hypokalemia: Secondary | ICD-10-CM | POA: Diagnosis present

## 2018-04-17 DIAGNOSIS — I482 Chronic atrial fibrillation, unspecified: Secondary | ICD-10-CM | POA: Diagnosis not present

## 2018-04-17 DIAGNOSIS — F79 Unspecified intellectual disabilities: Secondary | ICD-10-CM

## 2018-04-17 DIAGNOSIS — Z7901 Long term (current) use of anticoagulants: Secondary | ICD-10-CM | POA: Diagnosis not present

## 2018-04-17 DIAGNOSIS — E1151 Type 2 diabetes mellitus with diabetic peripheral angiopathy without gangrene: Secondary | ICD-10-CM | POA: Diagnosis present

## 2018-04-17 DIAGNOSIS — R4182 Altered mental status, unspecified: Secondary | ICD-10-CM | POA: Diagnosis not present

## 2018-04-17 DIAGNOSIS — G9341 Metabolic encephalopathy: Secondary | ICD-10-CM | POA: Diagnosis present

## 2018-04-17 DIAGNOSIS — I10 Essential (primary) hypertension: Secondary | ICD-10-CM | POA: Diagnosis present

## 2018-04-17 DIAGNOSIS — Z8249 Family history of ischemic heart disease and other diseases of the circulatory system: Secondary | ICD-10-CM | POA: Diagnosis not present

## 2018-04-17 DIAGNOSIS — Z841 Family history of disorders of kidney and ureter: Secondary | ICD-10-CM | POA: Diagnosis not present

## 2018-04-17 DIAGNOSIS — R51 Headache: Secondary | ICD-10-CM | POA: Diagnosis not present

## 2018-04-17 DIAGNOSIS — R410 Disorientation, unspecified: Secondary | ICD-10-CM | POA: Diagnosis present

## 2018-04-17 DIAGNOSIS — L0390 Cellulitis, unspecified: Secondary | ICD-10-CM | POA: Diagnosis present

## 2018-04-17 DIAGNOSIS — R42 Dizziness and giddiness: Secondary | ICD-10-CM | POA: Diagnosis not present

## 2018-04-17 DIAGNOSIS — I4891 Unspecified atrial fibrillation: Secondary | ICD-10-CM | POA: Diagnosis not present

## 2018-04-17 DIAGNOSIS — E785 Hyperlipidemia, unspecified: Secondary | ICD-10-CM | POA: Diagnosis not present

## 2018-04-17 LAB — COMPREHENSIVE METABOLIC PANEL
ALT: 13 U/L (ref 0–44)
AST: 18 U/L (ref 15–41)
Albumin: 3.6 g/dL (ref 3.5–5.0)
Alkaline Phosphatase: 64 U/L (ref 38–126)
Anion gap: 12 (ref 5–15)
BUN: 13 mg/dL (ref 8–23)
CALCIUM: 9.2 mg/dL (ref 8.9–10.3)
CO2: 23 mmol/L (ref 22–32)
Chloride: 105 mmol/L (ref 98–111)
Creatinine, Ser: 0.8 mg/dL (ref 0.61–1.24)
GFR calc Af Amer: >60 mL/min (ref >60)
Glucose, Bld: 173 mg/dL — ABNORMAL HIGH (ref 70–99)
Potassium: 3.6 mmol/L (ref 3.5–5.1)
Sodium: 140 mmol/L (ref 135–145)
Total Bilirubin: 0.8 mg/dL (ref 0.3–1.2)
Total Protein: 6.4 g/dL — ABNORMAL LOW (ref 6.5–8.1)

## 2018-04-17 LAB — CBC WITH DIFFERENTIAL/PLATELET
Abs Immature Granulocytes: 0.06 10*3/uL (ref 0.00–0.07)
Basophils Absolute: 0 10*3/uL (ref 0.0–0.1)
Basophils Relative: 0 %
Eosinophils Absolute: 0 10*3/uL (ref 0.0–0.5)
Eosinophils Relative: 0 %
HEMATOCRIT: 35.4 % — AB (ref 39.0–52.0)
Hemoglobin: 11.1 g/dL — ABNORMAL LOW (ref 13.0–17.0)
Immature Granulocytes: 1 %
Lymphocytes Relative: 11 %
Lymphs Abs: 1.3 10*3/uL (ref 0.7–4.0)
MCH: 27.9 pg (ref 26.0–34.0)
MCHC: 31.4 g/dL (ref 30.0–36.0)
MCV: 88.9 fL (ref 80.0–100.0)
MONOS PCT: 7 %
Monocytes Absolute: 0.9 10*3/uL (ref 0.1–1.0)
Neutro Abs: 10 10*3/uL — ABNORMAL HIGH (ref 1.7–7.7)
Neutrophils Relative %: 81 %
Platelets: 278 10*3/uL (ref 150–400)
RBC: 3.98 MIL/uL — ABNORMAL LOW (ref 4.22–5.81)
RDW: 12.9 % (ref 11.5–15.5)
WBC: 12.3 10*3/uL — ABNORMAL HIGH (ref 4.0–10.5)
nRBC: 0 % (ref 0.0–0.2)

## 2018-04-17 LAB — URINALYSIS, ROUTINE W REFLEX MICROSCOPIC
Bacteria, UA: NONE SEEN
Bilirubin Urine: NEGATIVE
Glucose, UA: NEGATIVE mg/dL
Hgb urine dipstick: NEGATIVE
Ketones, ur: NEGATIVE mg/dL
Leukocytes,Ua: NEGATIVE
Nitrite: NEGATIVE
Protein, ur: 30 mg/dL — AB
Specific Gravity, Urine: 1.018 (ref 1.005–1.030)
pH: 5 (ref 5.0–8.0)

## 2018-04-17 LAB — RAPID URINE DRUG SCREEN, HOSP PERFORMED
Amphetamines: NOT DETECTED
Barbiturates: NOT DETECTED
Benzodiazepines: NOT DETECTED
Cocaine: NOT DETECTED
Opiates: NOT DETECTED
Tetrahydrocannabinol: NOT DETECTED

## 2018-04-17 LAB — GRAM STAIN

## 2018-04-17 LAB — AMMONIA: Ammonia: 15 umol/L (ref 9–35)

## 2018-04-17 LAB — ETHANOL: Alcohol, Ethyl (B): <10 mg/dL (ref <10)

## 2018-04-17 MED ORDER — SODIUM CHLORIDE 0.9% FLUSH
3.0000 mL | INTRAVENOUS | Status: DC | PRN
  Administered 2018-04-19: 3 mL via INTRAVENOUS
  Filled 2018-04-17: qty 3

## 2018-04-17 MED ORDER — ONDANSETRON HCL 4 MG/2ML IJ SOLN: 4.0000 mg | Freq: Four times a day (QID) | INTRAMUSCULAR | Status: DC | PRN

## 2018-04-17 MED ORDER — LOSARTAN POTASSIUM 25 MG PO TABS
12.5000 mg | ORAL_TABLET | Freq: Every day | ORAL | Status: DC
  Administered 2018-04-18 – 2018-04-22 (×5): 12.5 mg via ORAL
  Filled 2018-04-17 (×5): qty 1

## 2018-04-17 MED ORDER — DILTIAZEM HCL ER COATED BEADS 180 MG PO CP24
180.0000 mg | ORAL_CAPSULE | Freq: Every day | ORAL | Status: DC
  Administered 2018-04-18 – 2018-04-22 (×5): 180 mg via ORAL
  Filled 2018-04-17 (×5): qty 1

## 2018-04-17 MED ORDER — ACETAMINOPHEN 650 MG RE SUPP: 650.0000 mg | Freq: Four times a day (QID) | RECTAL | Status: DC | PRN

## 2018-04-17 MED ORDER — METOPROLOL TARTRATE 25 MG PO TABS
25.0000 mg | ORAL_TABLET | Freq: Three times a day (TID) | ORAL | Status: DC
  Administered 2018-04-17 – 2018-04-22 (×13): 25 mg via ORAL
  Filled 2018-04-17 (×14): qty 1

## 2018-04-17 MED ORDER — ATORVASTATIN CALCIUM 10 MG PO TABS
10.0000 mg | ORAL_TABLET | Freq: Every day | ORAL | Status: DC
  Administered 2018-04-18 – 2018-04-22 (×5): 10 mg via ORAL
  Filled 2018-04-17 (×5): qty 1

## 2018-04-17 MED ORDER — ADULT MULTIVITAMIN W/MINERALS CH
1.0000 | ORAL_TABLET | Freq: Every day | ORAL | Status: DC
  Administered 2018-04-18 – 2018-04-22 (×5): 1 via ORAL
  Filled 2018-04-17 (×5): qty 1

## 2018-04-17 MED ORDER — RIVAROXABAN 20 MG PO TABS
20.0000 mg | ORAL_TABLET | Freq: Every day | ORAL | Status: DC
  Administered 2018-04-17 – 2018-04-21 (×5): 20 mg via ORAL
  Filled 2018-04-17 (×5): qty 1

## 2018-04-17 MED ORDER — CLINDAMYCIN PHOSPHATE 900 MG/50ML IV SOLN
900.0000 mg | Freq: Three times a day (TID) | INTRAVENOUS | Status: DC
  Administered 2018-04-17 – 2018-04-19 (×6): 900 mg via INTRAVENOUS
  Filled 2018-04-17 (×8): qty 50

## 2018-04-17 MED ORDER — SODIUM CHLORIDE 0.9% FLUSH
3.0000 mL | Freq: Two times a day (BID) | INTRAVENOUS | Status: DC
  Administered 2018-04-17 – 2018-04-22 (×8): 3 mL via INTRAVENOUS

## 2018-04-17 MED ORDER — ONDANSETRON HCL 4 MG PO TABS
4.0000 mg | ORAL_TABLET | Freq: Four times a day (QID) | ORAL | Status: DC | PRN
  Administered 2018-04-20: 4 mg via ORAL
  Filled 2018-04-17: qty 1

## 2018-04-17 MED ORDER — SODIUM CHLORIDE 0.9 % IV SOLN: 250.0000 mL | INTRAVENOUS | Status: DC | PRN

## 2018-04-17 MED ORDER — SENNOSIDES-DOCUSATE SODIUM 8.6-50 MG PO TABS
4.0000 | ORAL_TABLET | Freq: Every day | ORAL | Status: DC
  Administered 2018-04-17 – 2018-04-21 (×4): 4 via ORAL
  Filled 2018-04-17 (×4): qty 4

## 2018-04-17 MED ORDER — ACETAMINOPHEN 325 MG PO TABS: 650.0000 mg | ORAL_TABLET | Freq: Four times a day (QID) | ORAL | Status: DC | PRN

## 2018-04-17 NOTE — ED Notes (Signed)
Patient transported to CT 

## 2018-04-17 NOTE — ED Provider Notes (Signed)
I saw and evaluated the patient, reviewed the resident's note and I agree with the findings and plan.  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  The patient had some repetitive wording this morning, he was not following commands like he usually does, he was reaching for something in a high cupboard which he does not ever do and had no reason for doing, at this time the patient is not very cooperative with speaking though he is able to wake up  Pertinent Exam findings: On exam the patient has a disconjugate gaze which according to the family member in the room says is at his baseline.  The patient is able to answer simple yes and no questions but does not go into any details.  He is able to move all 4 extremities but is somnolent and falls asleep very quickly.  Vital signs otherwise look unremarkable  Anticipate the need for discharge given his age and level of alertness which has declined suggesting altered mental status either ischemic toxic or infectious.  I was personally present and directly supervised the following procedures:  Medical resuscitation  I personally interpreted the EKG as well as the resident and agree with the interpretation on the resident's chart.  Final diagnoses:  Altered mental status, unspecified altered mental status type      Eber Hong, MD 04/18/18 2337

## 2018-04-17 NOTE — ED Triage Notes (Signed)
Pt arrives via EMS from home with reports of headache and fever x2days.

## 2018-04-17 NOTE — ED Provider Notes (Signed)
[facility] EMERGENCY DEPARTMENT Provider Note   CSN: [id number] Arrival date & time: 04/17/18  1558    History   Chief Complaint Chief Complaint  Patient presents with  . Fever  . Headache    HPI SIRIUS LUMPP is a 73 y.o. male with history of intellectual disability, atrial fibrillation, diabetes, hyperlipidemia, essential hypertension who presents emergency department with family numbers concern for altered mental status over the course of the last day.  Patient is usually ambulatory at baseline and conversant with family members.  They state that he usually spends most of his day walking around the house as well as neighborhood.  Patient's cousin who is with him at bedside states that this morning when he returned from work he found the patient to be mildly lethargic and reaching for items and cabinets which she does not normally do.  He states that he became aggressive when he attempted to stop him from reaching for these cabinets.  Patient's cousin also notes that when he went up to the patient's room a cane was on the floor as well as a spilled cup of water.  He denies falling or any head trauma although this cannot be corroborated as found numbers were not in the house at the time.  Patient also had some repetitive speech and confusion of her feel members.  Per patient's cousin he was at his baseline yesterday and has not had any recent fevers, cough/cold/congestion, mention of nausea/vomiting or diarrhea.      Headache  Illness  Severity:  Moderate Onset quality:  Gradual Duration:  1 day Timing:  Constant Progression:  Unchanged Chronicity:  New   Past Medical History:  Diagnosis Date  . Cataract   . Cognitive deficits   . Depression   . Diabetes mellitus without complication (HCC)   . Hyperlipidemia   . Hypertension     Patient Active Problem List   Diagnosis Date Noted  . Acute metabolic encephalopathy 04/17/2018  . Atrial fibrillation (HCC)  03/15/2016  . Metabolic syndrome 05/24/2014  . Diabetes mellitus type 2, uncomplicated (HCC) 01/10/2014  . Essential hypertension 01/10/2014  . Hyperlipidemia LDL goal <70 09/21/2013  . Mental disability 06/15/2013  . Type 2 diabetes mellitus with peripheral circulatory disorder (HCC) 12/15/2012  . Vitamin B12 deficiency 12/15/2012  . 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 Essential hypertension, benign 12/15/2012  . Polypharmacy 12/15/2012    Past Surgical History:  Procedure Laterality Date  . CHOLECYSTECTOMY N/A 09/06/2012   Procedure: LAPAROSCOPIC CHOLECYSTECTOMY WITH INTRAOPERATIVE CHOLANGIOGRAM;  Surgeon: Robyne Askew, MD;  Location: [facility] OR;  Service: General;  Laterality: N/A;  . DENTAL SURGERY     had all teeth removed  . EYE SURGERY    . gall blader N/A    09/06/2012        Home Medications    Prior to Admission medications   Medication Sig Start Date End Date Taking? Authorizing Provider  atorvastatin (LIPITOR) 10 MG tablet Take 10 mg by mouth daily.  09/04/16  Yes [provider]  diltiazem (TIAZAC) 180 MG 24 hr capsule TAKE 1 CAPSULE BY MOUTH EVERY DAY 01/19/18  Yes Sherren Mocha, MD  losartan (COZAAR) 25 MG tablet TAKE 1/2 TABLET BY MOUTH ONCE DAILY 04/04/18  Yes Sherren Mocha, MD  metFORMIN (GLUCOPHAGE) 1000 MG tablet Take 1 tablet (1,000 mg total) by mouth 2 (two) times daily with a meal. 04/16/17  Yes Sherren Mocha,  MD  metoprolol tartrate (LOPRESSOR) 25 MG tablet Take 0.5 tablets (12.5 mg total) by mouth 2 (two) times daily. Patient taking differently: Take 25 mg by mouth 3 (three) times daily.  07/31/15  Yes Sherren Mocha, MD  Multiple Vitamin (MULTIVITAMIN) tablet Take 1 tablet by mouth daily.   Yes [provider]  rivaroxaban (XARELTO) 20 MG TABS tablet Take 1 tablet (20 mg total) by mouth daily with supper. 07/31/15  Yes Sherren Mocha, MD  senna-docusate (SENOKOT-S) 8.6-50 MG tablet Take 4 tablets by mouth at  bedtime. 04/16/17  Yes Sherren Mocha, MD    Family History Family History  Problem Relation Age of Onset  . Cancer Father   . Heart attack Paternal Grandfather   . Kidney disease Paternal Aunt     Social History Social History   Tobacco Use  . Smoking status: Never Smoker  . Smokeless tobacco: Never Used  Substance Use Topics  . Alcohol use: No  . Drug use: No     Allergies   Sulfa antibiotics   Review of Systems Review of Systems  Unable to perform ROS: Mental status change     Physical Exam Updated Vital Signs BP 127/63   Pulse 84   Temp 98.2 F (36.8 C) (Oral)   Resp 12   SpO2 98%   Physical Exam Vitals signs and nursing note reviewed.  Constitutional:      Appearance: He is well-developed.  HENT:     Head: Normocephalic and atraumatic.  Eyes:     Conjunctiva/sclera: Conjunctivae normal.  Neck:     Musculoskeletal: Neck supple.  Cardiovascular:     Rate and Rhythm: Normal rate and regular rhythm.     Heart sounds: No murmur.  Pulmonary:     Effort: Pulmonary effort is normal. No respiratory distress.     Breath sounds: Normal breath sounds.  Abdominal:     Palpations: Abdomen is soft.     Tenderness: There is no abdominal tenderness.  Skin:    General: Skin is warm and dry.  Neurological:     Mental Status: He is alert.     Comments: Patient is drowsy however arousable.  Disconjugate gaze noted which is consistent with the patient's baseline.  5 out of 5 strength in bilateral upper and lower extremities.  Patient is able to perform finger-to-nose albeit with some labored effort secondary to confusion.  No abnormal coordination is present.  Tone is normal.  No evidence of lower extremity clonus.      ED Treatments / Results  Labs (all labs ordered are listed, but only abnormal results are displayed) Labs Reviewed  CBC WITH DIFFERENTIAL/PLATELET - Abnormal; Notable for the following components:      Result Value   WBC 12.3 (*)    RBC 3.98 (*)     Hemoglobin 11.1 (*)    HCT 35.4 (*)    Neutro Abs 10.0 (*)    All other components within normal limits  COMPREHENSIVE METABOLIC PANEL - Abnormal; Notable for the following components:   Glucose, Bld 173 (*)    Total Protein 6.4 (*)    All other components within normal limits  URINALYSIS, ROUTINE W REFLEX MICROSCOPIC - Abnormal; Notable for the following components:   Protein, ur 30 (*)    All other components within normal limits  GRAM STAIN  AMMONIA  ETHANOL  RAPID URINE DRUG SCREEN, HOSP PERFORMED  INFLUENZA PANEL BY PCR (TYPE A & B)    EKG None  Radiology Ct Head Wo Contrast  Result Date: 04/17/2018 CLINICAL DATA:  Altered mental status, repetitive questioning EXAM: CT HEAD WITHOUT CONTRAST TECHNIQUE: Contiguous axial images were obtained from the base of the skull through the vertex without intravenous contrast. COMPARISON:  02/02/2013 FINDINGS: Brain: No evidence of acute infarction, hemorrhage, extra-axial collection, ventriculomegaly, or mass effect. Generalized cerebral atrophy. Periventricular white matter low attenuation likely secondary to microangiopathy. Vascular: Cerebrovascular atherosclerotic calcifications are noted. Skull: Negative for fracture or focal lesion. Sinuses/Orbits: Visualized portions of the orbits are unremarkable. Visualized portions of the paranasal sinuses and mastoid air cells are unremarkable. Other: None. IMPRESSION: 1. No acute intracranial pathology. 2. Chronic microvascular disease and cerebral atrophy. Electronically Signed   By: Elige Ko   On: 04/17/2018 18:22   Dg Chest Portable 1 View  Result Date: 04/17/2018 CLINICAL DATA:  Altered mental status. Dizziness. Headache. Fever. EXAM: PORTABLE CHEST 1 VIEW COMPARISON:  Radiographs 01/10/2014 and 08/30/2012. FINDINGS: 1645 hours. There is stable cardiomegaly and aortic atherosclerosis. The lungs are clear. There is no pleural effusion or pneumothorax. No acute osseous findings are evident.  Telemetry leads overlie the chest. IMPRESSION: No active cardiopulmonary process.  Cardiomegaly. Electronically Signed   By: Carey Bullocks M.D.   On: 04/17/2018 17:16    Procedures Procedures (including critical care time)  Medications Ordered in ED Medications - No data to display   Initial Impression / Assessment and Plan / ED Course  I have reviewed the triage vital signs and the nursing notes.  Pertinent labs & imaging results that were available during my care of the patient were reviewed by me and considered in my medical decision making (see chart for details).       Patient is a 29-year-old male with history of intellectual disability who presents emergency department with family concern for increasing confusion and altered mental status that has developed all the course of the day.  On initial evaluation the patient he was hemodynamically stable and nontoxic-appearing.  The patient was afebrile, not tachycardic, normotensive, satting appropriately on room air.  Physical exam as detailed above which is remarkable for drowsy appearing elderly male who is arousable with conversation.  No focal neurologic deficits are present and patient is able to perform finger-to-nose albeit with some level of confusion.  Patient's conversation at this point is limited.  No obvious signs of traumatic injuries over the scalp or skull. Abdomen is benign.   Differential is broad at this time to include metabolic, infectious, toxicologic, or primary neurologic for the patient's presenting complaints of AMS.  CBC with slight leukocytosis of 12.3.  Hemoglobin 11.1.  CMP with no significant metabolic or electrolyte derangements.  Glucose is 173 however bicarb and anion gap are within normal limits.  Also not elevated enough to be concerning for HHS.  Ammonia within normal limits.  EtOH and UDS negative.  Chest x-ray and urinalysis showed no signs of infectious processes. CT with no acute intracranial  abnormalities.  Given largely unremarkable encephalopathic work-up with no identifiable source of the patient's current symptoms, feel he requires admission at this time for further evaluation monitoring.  Patient may benefit from inpatient neurology consultation and work-up for occult stroke.  Do not feel strongly that the patient symptoms are secondary to meningitis as he has no fever here and neck is supple with no meningeal signs.  Do not feel lumbar puncture is warranted at this time.  Patient's case was discussed with general medicine services who are in agreement with admission at  this time. Will obtain influenza swab at their request. He was in stable condition at time of admission.   Final Clinical Impressions(s) / ED Diagnoses   Final diagnoses:  Altered mental status, unspecified altered mental status type    ED Discharge Orders    None       Leonette Monarch, MD 04/17/18 Teresa Pelton, MD 04/18/18 2337

## 2018-04-17 NOTE — ED Notes (Signed)
Attempted report x1. 

## 2018-04-17 NOTE — ED Triage Notes (Signed)
Family at bedside. Family reports EMS was called for AMS and repetitve questioning. Pt stating he starting getting dizzy and has a headache that started today

## 2018-04-17 NOTE — ED Notes (Signed)
Attempted to call report x2. Receiving nurse asked to do bedside report.

## 2018-04-17 NOTE — ED Notes (Signed)
ED TO INPATIENT HANDOFF REPORT  ED Nurse Name and Phone #: [place] [phone]  S Name/Age/Gender Nicholas Hardin 73 y.o. male Room/Bed: 023C/023C  Code Status   Code Status: Full Code  Home/SNF/Other Home Patient oriented to: self Is this baseline? No   Triage Complete: Triage complete  Chief Complaint HA; Fever x2days  Triage Note Pt arrives via EMS from home with reports of headache and fever x2days.   Family at bedside. Family reports EMS was called for AMS and repetitve questioning. Pt stating he starting getting dizzy and has a headache that started today   Allergies Allergies  Allergen Reactions  . Sulfa Antibiotics Hives    Level of Care/Admitting Diagnosis ED Disposition    ED Disposition Condition Comment   Admit  Hospital Area: [facility] [[id number]]  Level of Care: Med-Surg [16]  I expect the patient will be discharged within 24 hours: Yes  LOW acuity---Tx typically complete <24 hrs---ACUTE conditions typically can be evaluated <24 hours---LABS likely to return to acceptable levels <24 hours---IS near functional baseline---EXPECTED to return to current living arrangement---NOT newly hypoxic: Meets criteria for 5C-Observation unit  Diagnosis: Confusion [[id number]]  Admitting Physician: Haydee Monica [[id number]]  Attending Physician: Tarry Kos A [[id number]]  PT Class (Do Not Modify): Observation [104]  PT Acc Code (Do Not Modify): Observation [10022]       B Medical/Surgery History Past Medical History:  Diagnosis Date  . Cataract   . Cognitive deficits   . Depression   . Diabetes mellitus without complication (HCC)   . Hyperlipidemia   . Hypertension    Past Surgical History:  Procedure Laterality Date  . CHOLECYSTECTOMY N/A 09/06/2012   Procedure: LAPAROSCOPIC CHOLECYSTECTOMY WITH INTRAOPERATIVE CHOLANGIOGRAM;  Surgeon: Robyne Askew, MD;  Location: [facility] OR;  Service: General;  Laterality: N/A;  . DENTAL SURGERY     had all teeth removed   . EYE SURGERY    . gall blader N/A    09/06/2012     A IV Location/Drains/Wounds Patient Lines/Drains/Airways Status   Active Line/Drains/Airways    Name:   Placement date:   Placement time:   Site:   Days:   Peripheral IV 04/17/18 Right Antecubital   04/17/18    1949    Antecubital   less than 1          Intake/Output Last 24 hours No intake or output data in the 24 hours ending 04/17/18 2016  Labs/Imaging Results for orders placed or performed during the hospital encounter of 04/17/18 (from the past 48 hour(s))  CBC with Differential     Status: Abnormal   Collection Time: 04/17/18  4:53 PM  Result Value Ref Range   WBC 12.3 (H) 4.0 - 10.5 K/uL   RBC 3.98 (L) 4.22 - 5.81 MIL/uL   Hemoglobin 11.1 (L) 13.0 - 17.0 g/dL   HCT 11.9 (L) 14.7 - 82.9 %   MCV 88.9 80.0 - 100.0 fL   MCH 27.9 26.0 - 34.0 pg   MCHC 31.4 30.0 - 36.0 g/dL   RDW 56.2 13.0 - 86.5 %   Platelets 278 150 - 400 K/uL   nRBC 0.0 0.0 - 0.2 %   Neutrophils Relative % 81 %   Neutro Abs 10.0 (H) 1.7 - 7.7 K/uL   Lymphocytes Relative 11 %   Lymphs Abs 1.3 0.7 - 4.0 K/uL   Monocytes Relative 7 %   Monocytes Absolute 0.9 0.1 - 1.0 K/uL   Eosinophils Relative 0 %  Eosinophils Absolute 0.0 0.0 - 0.5 K/uL   Basophils Relative 0 %   Basophils Absolute 0.0 0.0 - 0.1 K/uL   Immature Granulocytes 1 %   Abs Immature Granulocytes 0.06 0.00 - 0.07 K/uL    Comment: Performed at [facility], [address], [place], [place] [address]  Comprehensive metabolic panel     Status: Abnormal   Collection Time: 04/17/18  4:53 PM  Result Value Ref Range   Sodium 140 135 - 145 mmol/L   Potassium 3.6 3.5 - 5.1 mmol/L   Chloride 105 98 - 111 mmol/L   CO2 23 22 - 32 mmol/L   Glucose, Bld 173 (H) 70 - 99 mg/dL   BUN 13 8 - 23 mg/dL   Creatinine, Ser 4.08 0.61 - 1.24 mg/dL   Calcium 9.2 8.9 - 14.4 mg/dL   Total Protein 6.4 (L) 6.5 - 8.1 g/dL   Albumin 3.6 3.5 - 5.0 g/dL   AST 18 15 - 41 U/L   ALT 13 0 - 44 U/L    Alkaline Phosphatase 64 38 - 126 U/L   Total Bilirubin 0.8 0.3 - 1.2 mg/dL   GFR calc non Af Amer >60 >60 mL/min   GFR calc Af Amer >60 >60 mL/min   Anion gap 12 5 - 15    Comment: Performed at [facility], [address], [place], [place] [address]  Ammonia     Status: None   Collection Time: 04/17/18  4:53 PM  Result Value Ref Range   Ammonia 15 9 - 35 umol/L    Comment: Performed at [facility], [address], [place], [place] [address]  Urinalysis, Routine w reflex microscopic     Status: Abnormal   Collection Time: 04/17/18  4:53 PM  Result Value Ref Range   Color, Urine YELLOW YELLOW   APPearance CLEAR CLEAR   Specific Gravity, Urine 1.018 1.005 - 1.030   pH 5.0 5.0 - 8.0   Glucose, UA NEGATIVE NEGATIVE mg/dL   Hgb urine dipstick NEGATIVE NEGATIVE   Bilirubin Urine NEGATIVE NEGATIVE   Ketones, ur NEGATIVE NEGATIVE mg/dL   Protein, ur 30 (A) NEGATIVE mg/dL   Nitrite NEGATIVE NEGATIVE   Leukocytes,Ua NEGATIVE NEGATIVE   RBC / HPF 0-5 0 - 5 RBC/hpf   WBC, UA 0-5 0 - 5 WBC/hpf   Bacteria, UA NONE SEEN NONE SEEN   Squamous Epithelial / LPF 0-5 0 - 5   Mucus PRESENT     Comment: Performed at [facility], [address], [place], [place] [address]  Gram stain     Status: None   Collection Time: 04/17/18  4:53 PM  Result Value Ref Range   Specimen Description URINE, CLEAN CATCH    Special Requests NONE    Gram Stain      WBC PRESENT,BOTH PMN AND MONONUCLEAR GRAM POSITIVE COCCI GRAM VARIABLE ROD Gram Stain Report Called to,Read Back By and Verified With: Higinio Roger RN [phone] 04/17/18 A BROWNING Performed at [facility], [address], [place], [place] [address]    Report Status 04/17/2018 FINAL   Ethanol     Status: None   Collection Time: 04/17/18  4:53 PM  Result Value Ref Range   Alcohol, Ethyl (B) <10 <10 mg/dL    Comment: (NOTE) Lowest detectable limit for serum alcohol is 10 mg/dL. For medical purposes only. Performed at [facility], [address], [place], [place] [address]  Rapid urine drug screen (hospital performed)     Status: None   Collection Time: 04/17/18  4:53 PM  Result Value Ref Range   Opiates NONE DETECTED NONE DETECTED   Cocaine NONE DETECTED NONE DETECTED   Benzodiazepines NONE DETECTED NONE DETECTED   Amphetamines NONE DETECTED NONE DETECTED   Tetrahydrocannabinol NONE DETECTED NONE DETECTED   Barbiturates NONE DETECTED NONE DETECTED    Comment: (NOTE) DRUG SCREEN FOR MEDICAL PURPOSES ONLY.  IF CONFIRMATION IS NEEDED FOR ANY PURPOSE, NOTIFY LAB WITHIN 5 DAYS. LOWEST DETECTABLE LIMITS FOR URINE DRUG SCREEN Drug Class                     Cutoff (ng/mL) Amphetamine and metabolites    1000 Barbiturate and metabolites    200 Benzodiazepine                 200 Tricyclics and metabolites     300 Opiates and metabolites        300 Cocaine and metabolites        300 THC                            50 Performed at [facility], [address], [place], [place] [address]    Ct Head Wo Contrast  Result Date: 04/17/2018 CLINICAL DATA:  Altered mental status, repetitive questioning EXAM: CT HEAD WITHOUT CONTRAST TECHNIQUE: Contiguous axial images were obtained from the base of the skull through the vertex without intravenous contrast. COMPARISON:  02/02/2013 FINDINGS: Brain: No evidence of acute infarction, hemorrhage, extra-axial collection, ventriculomegaly, or mass effect. Generalized cerebral atrophy. Periventricular white matter low attenuation likely secondary to microangiopathy. Vascular: Cerebrovascular atherosclerotic calcifications are noted. Skull: Negative for fracture or focal lesion. Sinuses/Orbits: Visualized portions of the orbits are unremarkable. Visualized portions of the paranasal sinuses and mastoid air cells are unremarkable. Other: None. IMPRESSION: 1. No acute intracranial pathology. 2. Chronic microvascular disease and cerebral atrophy. Electronically Signed   By: Elige Ko   On: 04/17/2018 18:22   Dg Chest Portable 1 View  Result Date: 04/17/2018 CLINICAL DATA:  Altered mental status. Dizziness. Headache. Fever. EXAM: PORTABLE CHEST 1 VIEW COMPARISON:  Radiographs 01/10/2014 and 08/30/2012. FINDINGS: 1645 hours. There is stable cardiomegaly and aortic atherosclerosis. The lungs are clear. There is no pleural effusion or pneumothorax. No acute osseous findings are evident. Telemetry leads overlie the chest. IMPRESSION: No active cardiopulmonary process.  Cardiomegaly. Electronically Signed   By: Carey Bullocks M.D.   On: 04/17/2018 17:16    Pending Labs Unresulted Labs (From admission, onward)    Start     Ordered   04/18/18 0500  Basic metabolic panel  Tomorrow morning,   R     04/17/18 2003   04/18/18 0500  CBC  Tomorrow morning,   R     04/17/18 2003   04/17/18 1950  Influenza panel by PCR (type A & B)  (Influenza PCR Panel)  ONCE - STAT,   R     04/17/18 1949          Vitals/Pain Today's Vitals   04/17/18 1814 04/17/18 1830 04/17/18 1900 04/17/18 1948  BP: 123/71 125/68 127/63   Pulse: 83 82 84   Resp: Temp:      TempSrc:      SpO2: 98% 99% 98%   PainSc: Asleep   Asleep    Isolation Precautions Droplet  precaution  Medications Medications  multivitamin tablet 1 tablet (has no administration in time range)  rivaroxaban (XARELTO) tablet 20 mg (has no administration in time range)  metoprolol tartrate (LOPRESSOR) tablet 25 mg (has no administration in time range)  atorvastatin (LIPITOR) tablet 10 mg (has no administration in time range)  senna-docusate (Senokot-S) tablet 4 tablet (has no administration in time range)  diltiazem (TIAZAC) 24 hr capsule 180 mg (has no administration in time range)  losartan (COZAAR) tablet 12.5 mg (has no administration in time range)  sodium chloride flush (NS) 0.9 % injection 3 mL (has no administration in time range)  sodium chloride flush (NS) 0.9 % injection 3 mL (has no administration  in time range)  0.9 %  sodium chloride infusion (has no administration in time range)  acetaminophen (TYLENOL) tablet 650 mg (has no administration in time range)    Or  acetaminophen (TYLENOL) suppository 650 mg (has no administration in time range)  ondansetron (ZOFRAN) tablet 4 mg (has no administration in time range)    Or  ondansetron (ZOFRAN) injection 4 mg (has no administration in time range)  clindamycin (CLEOCIN) IVPB 900 mg (has no administration in time range)    Mobility walks with device Moderate fall risk   Focused Assessments    R Recommendations: See Admitting Provider Note  Report given to:   Additional Notes:

## 2018-04-17 NOTE — H&P (Signed)
History and Physical    Nicholas Hardin KDT:[id number] DOB: 08-31-1945 DOA: 04/17/2018  PCP: Sherren Mocha, MD  Patient coming from: Home  Chief Complaint: Confusion  HPI: Nicholas Hardin is a 73 y.o. male with medical history significant of disability, diabetes, hypertension brought in by family members because of confusion that is uncharacteristic for his baseline.  There is no note of fevers or chills at home.  Family is not available but there is no report of nausea vomiting or diarrhea or cough.  Patient really cannot provide any useful information he does appear comfortable.  Patient be referred for admission for altered mental status of unclear etiology.  Review of Systems: As per HPI otherwise 10 point review of systems negative but highly unreliable  Past Medical History:  Diagnosis Date   Cataract    Cognitive deficits    Depression    Diabetes mellitus without complication (HCC)    Hyperlipidemia    Hypertension     Past Surgical History:  Procedure Laterality Date   CHOLECYSTECTOMY N/A 09/06/2012   Procedure: LAPAROSCOPIC CHOLECYSTECTOMY WITH INTRAOPERATIVE CHOLANGIOGRAM;  Surgeon: Robyne Askew, MD;  Location: [facility] OR;  Service: General;  Laterality: N/A;   DENTAL SURGERY     had all teeth removed   EYE SURGERY     gall blader N/A    09/06/2012     reports that he has never smoked. He has never used smokeless tobacco. He reports that he does not drink alcohol or use drugs.  Allergies  Allergen Reactions   Sulfa Antibiotics Hives    Family History  Problem Relation Age of Onset   Cancer Father    Heart attack Paternal Grandfather    Kidney disease Paternal Aunt     Prior to Admission medications   Medication Sig Start Date End Date Taking? Authorizing Provider  atorvastatin (LIPITOR) 10 MG tablet Take 10 mg by mouth daily.  09/04/16  Yes [provider]  diltiazem (TIAZAC) 180 MG 24 hr capsule TAKE 1 CAPSULE BY MOUTH EVERY DAY 01/19/18  Yes  Sherren Mocha, MD  losartan (COZAAR) 25 MG tablet TAKE 1/2 TABLET BY MOUTH ONCE DAILY 04/04/18  Yes Sherren Mocha, MD  metFORMIN (GLUCOPHAGE) 1000 MG tablet Take 1 tablet (1,000 mg total) by mouth 2 (two) times daily with a meal. 04/16/17  Yes Sherren Mocha, MD  metoprolol tartrate (LOPRESSOR) 25 MG tablet Take 0.5 tablets (12.5 mg total) by mouth 2 (two) times daily. Patient taking differently: Take 25 mg by mouth 3 (three) times daily.  07/31/15  Yes Sherren Mocha, MD  Multiple Vitamin (MULTIVITAMIN) tablet Take 1 tablet by mouth daily.   Yes [provider]  rivaroxaban (XARELTO) 20 MG TABS tablet Take 1 tablet (20 mg total) by mouth daily with supper. 07/31/15  Yes Sherren Mocha, MD  senna-docusate (SENOKOT-S) 8.6-50 MG tablet Take 4 tablets by mouth at bedtime. 04/16/17  Yes Sherren Mocha, MD    Physical Exam: Vitals:   04/17/18 1630 04/17/18 1814 04/17/18 1830 04/17/18 1900  BP: 106/77 123/71 125/68 127/63  Pulse:  83 82 84  Resp: 17 14 14 12   Temp:      TempSrc:      SpO2:  98% 99% 98%      Constitutional: NAD, calm, comfortable mentally challenged Vitals:   04/17/18 1630 04/17/18 1814 04/17/18 1830 04/17/18 1900  BP: 106/77 123/71 125/68 127/63  Pulse:  83 82 84  Resp: 17 14  14 12  Temp:      TempSrc:      SpO2:  98% 99% 98%   Eyes: PERRL, lids and conjunctivae normal ENMT: Mucous membranes are moist. Posterior pharynx clear of any exudate or lesions.Normal dentition.  Neck: normal, supple, no masses, no thyromegaly Respiratory: clear to auscultation bilaterally, no wheezing, no crackles. Normal respiratory effort. No accessory muscle use.  Cardiovascular: Regular rate and rhythm, no murmurs / rubs / gallops. No extremity edema. 2+ pedal pulses. No carotid bruits.  Abdomen: no tenderness, no masses palpated. No hepatosplenomegaly. Bowel sounds positive.  Musculoskeletal: no clubbing / cyanosis. No joint deformity upper and lower extremities. Good ROM, no contractures. Normal  muscle tone.  Skin: Wound with surrounding erythema to anterior right shin  No induration or fluctuance Neurologic: CN 2-12 grossly intact. Sensation intact, DTR normal. Strength 5/5 in all 4.  Psychiatric: Not normal judgment and insight. Alert and oriented x 1. Normal mood nonagitated   Labs on Admission: I have personally reviewed following labs and imaging studies  CBC: Recent Labs  Lab 04/17/18 1653  WBC 12.3*  NEUTROABS 10.0*  HGB 11.1*  HCT 35.4*  MCV 88.9  PLT 278   Basic Metabolic Panel: Recent Labs  Lab 04/17/18 1653  NA 140  K 3.6  CL 105  CO2 23  GLUCOSE 173*  BUN 13  CREATININE 0.80  CALCIUM 9.2   GFR: CrCl cannot be calculated (Unknown ideal weight.). Liver Function Tests: Recent Labs  Lab 04/17/18 1653  AST 18  ALT 13  ALKPHOS 64  BILITOT 0.8  PROT 6.4*  ALBUMIN 3.6   No results for input(s): LIPASE, AMYLASE in the last 168 hours. Recent Labs  Lab 04/17/18 1653  AMMONIA 15   Coagulation Profile: No results for input(s): INR, PROTIME in the last 168 hours. Cardiac Enzymes: No results for input(s): CKTOTAL, CKMB, CKMBINDEX, TROPONINI in the last 168 hours. BNP (last 3 results) No results for input(s): PROBNP in the last 8760 hours. HbA1C: No results for input(s): HGBA1C in the last 72 hours. CBG: No results for input(s): GLUCAP in the last 168 hours. Lipid Profile: No results for input(s): CHOL, HDL, LDLCALC, TRIG, CHOLHDL, LDLDIRECT in the last 72 hours. Thyroid Function Tests: No results for input(s): TSH, T4TOTAL, FREET4, T3FREE, THYROIDAB in the last 72 hours. Anemia Panel: No results for input(s): VITAMINB12, FOLATE, FERRITIN, TIBC, IRON, RETICCTPCT in the last 72 hours. Urine analysis:    Component Value Date/Time   COLORURINE YELLOW 04/17/2018 1653   APPEARANCEUR CLEAR 04/17/2018 1653   LABSPEC 1.018 04/17/2018 1653   PHURINE 5.0 04/17/2018 1653   GLUCOSEU NEGATIVE 04/17/2018 1653   HGBUR NEGATIVE 04/17/2018 1653    BILIRUBINUR NEGATIVE 04/17/2018 1653   BILIRUBINUR negative 03/25/2015 1023   BILIRUBINUR neg 05/24/2014 0951   KETONESUR NEGATIVE 04/17/2018 1653   PROTEINUR 30 (A) 04/17/2018 1653   UROBILINOGEN 0.2 03/25/2015 1023   UROBILINOGEN 0.2 01/10/2014 2349   NITRITE NEGATIVE 04/17/2018 1653   LEUKOCYTESUR NEGATIVE 04/17/2018 1653   Sepsis Labs: !!!!!!!!!!!!!!!!!!!!!!!!!!!!!!!!!!!!!!!!!!!! (procalcitonin:4,lacticidven:4) ) Recent Results (from the past 240 hour(s))  Gram stain     Status: None   Collection Time: 04/17/18  4:53 PM  Result Value Ref Range Status   Specimen Description URINE, CLEAN CATCH  Final   Special Requests NONE  Final   Gram Stain   Final    WBC PRESENT,BOTH PMN AND MONONUCLEAR GRAM POSITIVE COCCI GRAM VARIABLE ROD Gram Stain Report Called to,Read Back By and Verified With: M  Benna Dunks RN [phone] 04/17/18 A BROWNING Performed at [facility], [address], [place], [place] [address]    Report Status 04/17/2018 FINAL  Final     Radiological Exams on Admission: Ct Head Wo Contrast  Result Date: 04/17/2018 CLINICAL DATA:  Altered mental status, repetitive questioning EXAM: CT HEAD WITHOUT CONTRAST TECHNIQUE: Contiguous axial images were obtained from the base of the skull through the vertex without intravenous contrast. COMPARISON:  02/02/2013 FINDINGS: Brain: No evidence of acute infarction, hemorrhage, extra-axial collection, ventriculomegaly, or mass effect. Generalized cerebral atrophy. Periventricular white matter low attenuation likely secondary to microangiopathy. Vascular: Cerebrovascular atherosclerotic calcifications are noted. Skull: Negative for fracture or focal lesion. Sinuses/Orbits: Visualized portions of the orbits are unremarkable. Visualized portions of the paranasal sinuses and mastoid air cells are unremarkable. Other: None. IMPRESSION: 1. No acute intracranial pathology. 2. Chronic microvascular disease and cerebral atrophy. Electronically  Signed   By: Elige Ko   On: 04/17/2018 18:22   Dg Chest Portable 1 View  Result Date: 04/17/2018 CLINICAL DATA:  Altered mental status. Dizziness. Headache. Fever. EXAM: PORTABLE CHEST 1 VIEW COMPARISON:  Radiographs 01/10/2014 and 08/30/2012. FINDINGS: 1645 hours. There is stable cardiomegaly and aortic atherosclerosis. The lungs are clear. There is no pleural effusion or pneumothorax. No acute osseous findings are evident. Telemetry leads overlie the chest. IMPRESSION: No active cardiopulmonary process.  Cardiomegaly. Electronically Signed   By: Carey Bullocks M.D.   On: 04/17/2018 17:16   Old chart reviewed Case cussed with EDP Chest x-ray reviewed no edema or infiltrate  Assessment/Plan 73 year old male with acute metabolic encephalopathy likely secondary to developing cellulitis to his right lower extremity Principal Problem:   Acute metabolic encephalopathy-likely due to infection treat infection  Active Problems:   Cellulitis-placed on IV clindamycin.  Appearance looks like it is just beginning.  No fluctuance or induration but there is an opened small sore to area with surrounding erythema    Type 2 diabetes mellitus with peripheral circulatory disorder (HCC)-hold metformin    Mental disability-stable    Essential hypertension- continue home meds    Atrial fibrillation (HCC)-continue Xarelto     DVT prophylaxis: Xarelto Code Status: Full Family Communication: None Disposition Plan: 1 to 2 days Consults called: None Admission status: Observation   Anokhi Shannon A MD [facility]  If 7PM-7AM, please contact night-coverage [email or web address] Password [facility]  04/17/2018, 8:12 PM

## 2018-04-18 ENCOUNTER — Other Ambulatory Visit: Payer: Self-pay | Admitting: Family Medicine

## 2018-04-18 LAB — BASIC METABOLIC PANEL
Anion gap: 9 (ref 5–15)
BUN: 10 mg/dL (ref 8–23)
CHLORIDE: 105 mmol/L (ref 98–111)
CO2: 26 mmol/L (ref 22–32)
Calcium: 9.1 mg/dL (ref 8.9–10.3)
Creatinine, Ser: 0.89 mg/dL (ref 0.61–1.24)
GFR calc Af Amer: >60 mL/min (ref >60)
GFR calc non Af Amer: >60 mL/min (ref >60)
Glucose, Bld: 128 mg/dL — ABNORMAL HIGH (ref 70–99)
Potassium: 3.4 mmol/L — ABNORMAL LOW (ref 3.5–5.1)
Sodium: 140 mmol/L (ref 135–145)

## 2018-04-18 LAB — CBC
HEMATOCRIT: 33.1 % — AB (ref 39.0–52.0)
Hemoglobin: 10.7 g/dL — ABNORMAL LOW (ref 13.0–17.0)
MCH: 27.9 pg (ref 26.0–34.0)
MCHC: 32.3 g/dL (ref 30.0–36.0)
MCV: 86.4 fL (ref 80.0–100.0)
Platelets: 262 10*3/uL (ref 150–400)
RBC: 3.83 MIL/uL — ABNORMAL LOW (ref 4.22–5.81)
RDW: 12.9 % (ref 11.5–15.5)
WBC: 10.3 10*3/uL (ref 4.0–10.5)
nRBC: 0 % (ref 0.0–0.2)

## 2018-04-18 LAB — INFLUENZA PANEL BY PCR (TYPE A & B)
Influenza A By PCR: NEGATIVE
Influenza B By PCR: NEGATIVE

## 2018-04-18 LAB — HEMOGLOBIN A1C
HEMOGLOBIN A1C: 6.8 % — AB (ref 4.8–5.6)
Mean Plasma Glucose: 148.46 mg/dL

## 2018-04-18 MED ORDER — POTASSIUM CHLORIDE CRYS ER 20 MEQ PO TBCR
40.0000 meq | EXTENDED_RELEASE_TABLET | Freq: Once | ORAL | Status: AC
  Administered 2018-04-18: 40 meq via ORAL
  Filled 2018-04-18: qty 2

## 2018-04-18 NOTE — Progress Notes (Signed)
Progress Note    Nicholas Hardin  PPI:[id number] DOB: [birth year]  DOA: 04/17/2018 PCP: Sherren Mocha, MD    Brief Narrative:   Medical records reviewed and are as summarized below:  Nicholas Hardin is an 73 y.o. male with medical history significant of disability, diabetes, hypertension brought in by family members because of confusion that is uncharacteristic for his baseline.   Assessment/Plan:   Principal Problem:   Acute metabolic encephalopathy Active Problems:   Type 2 diabetes mellitus with peripheral circulatory disorder (HCC)   Mental disability   Essential hypertension   Atrial fibrillation (HCC)   Cellulitis  Acute metabolic encephalopathy -likely due to infection  -treating  Infection -unable to determine baseline as could not reach family    Cellulitis -placed on IV clindamycin -appears to have small tear on right shin    Type 2 diabetes mellitus with peripheral circulatory disorder (HCC) -hold metformin -check HgbA1c -add SSI if needed    Mental disability -unsure of baseline -lives with niece and nephew    Essential hypertension - continue home meds    Atrial fibrillation (HCC) -continue Xarelto   Hypokalemia -replete    Family Communication/Anticipated D/C date and plan/Code Status   DVT prophylaxis: Lovenox ordered. Code Status: Full Code.  Family Communication: called [name] on cell- NA Disposition Plan: PT/OT eval   Medical Consultants:    None.    Subjective:   In bed, no complaints  Objective:    Vitals:   04/17/18 2115 04/18/18 0032 04/18/18 0400 04/18/18 0900  BP: (!) 128/92 124/67 138/72 128/76  Pulse: 86 73 86 78  Resp: 15 18 18 17   Temp: 98.4 F (36.9 C) 98.2 F (36.8 C) 98.6 F (37 C) 97.7 F (36.5 C)  TempSrc: Oral Oral Oral Oral  SpO2: 96% 98% 98% 98%    Intake/Output Summary (Last 24 hours) at 04/18/2018 1159 Last data filed at 04/18/2018 0533 Gross per 24 hour  Intake 420 ml  Output -  Net  420 ml   There were no vitals filed for this visit.  Exam: In bed, NAD Small sore on right shin with minimal redness rrr No wheezing  Data Reviewed:   I have personally reviewed following labs and imaging studies:  Labs: Labs show the following:   Basic Metabolic Panel: Recent Labs  Lab 04/17/18 1653 04/18/18 0423  NA 140 140  K 3.6 3.4*  CL 105 105  CO2 23 26  GLUCOSE 173* 128*  BUN 13 10  CREATININE 0.80 0.89  CALCIUM 9.2 9.1   GFR CrCl cannot be calculated (Unknown ideal weight.). Liver Function Tests: Recent Labs  Lab 04/17/18 1653  AST 18  ALT 13  ALKPHOS 64  BILITOT 0.8  PROT 6.4*  ALBUMIN 3.6   No results for input(s): LIPASE, AMYLASE in the last 168 hours. Recent Labs  Lab 04/17/18 1653  AMMONIA 15   Coagulation profile No results for input(s): INR, PROTIME in the last 168 hours.  CBC: Recent Labs  Lab 04/17/18 1653 04/18/18 0423  WBC 12.3* 10.3  NEUTROABS 10.0*  --   HGB 11.1* 10.7*  HCT 35.4* 33.1*  MCV 88.9 86.4  PLT 278 262   Cardiac Enzymes: No results for input(s): CKTOTAL, CKMB, CKMBINDEX, TROPONINI in the last 168 hours. BNP (last 3 results) No results for input(s): PROBNP in the last 8760 hours. CBG: No results for input(s): GLUCAP in the last 168 hours. D-Dimer: No results for input(s): DDIMER in the  last 72 hours. Hgb A1c: No results for input(s): HGBA1C in the last 72 hours. Lipid Profile: No results for input(s): CHOL, HDL, LDLCALC, TRIG, CHOLHDL, LDLDIRECT in the last 72 hours. Thyroid function studies: No results for input(s): TSH, T4TOTAL, T3FREE, THYROIDAB in the last 72 hours.  Invalid input(s): FREET3 Anemia work up: No results for input(s): VITAMINB12, FOLATE, FERRITIN, TIBC, IRON, RETICCTPCT in the last 72 hours. Sepsis Labs: Recent Labs  Lab 04/17/18 1653 04/18/18 0423  WBC 12.3* 10.3    Microbiology Recent Results (from the past 240 hour(s))  Gram stain     Status: None   Collection Time:  04/17/18  4:53 PM  Result Value Ref Range Status   Specimen Description URINE, CLEAN CATCH  Final   Special Requests NONE  Final   Gram Stain   Final    WBC PRESENT,BOTH PMN AND MONONUCLEAR GRAM POSITIVE COCCI GRAM VARIABLE ROD Gram Stain Report Called to,Read Back By and Verified With: Higinio RogerM BARBER RN [phone] 04/17/18 A BROWNING Performed at [facility], [address] [place], [place] [address]    Report Status 04/17/2018 FINAL  Final    Procedures and diagnostic studies:  Ct Head Wo Contrast  Result Date: 04/17/2018 CLINICAL DATA:  Altered mental status, repetitive questioning EXAM: CT HEAD WITHOUT CONTRAST TECHNIQUE: Contiguous axial images were obtained from the base of the skull through the vertex without intravenous contrast. COMPARISON:  02/02/2013 FINDINGS: Brain: No evidence of acute infarction, hemorrhage, extra-axial collection, ventriculomegaly, or mass effect. Generalized cerebral atrophy. Periventricular white matter low attenuation likely secondary to microangiopathy. Vascular: Cerebrovascular atherosclerotic calcifications are noted. Skull: Negative for fracture or focal lesion. Sinuses/Orbits: Visualized portions of the orbits are unremarkable. Visualized portions of the paranasal sinuses and mastoid air cells are unremarkable. Other: None. IMPRESSION: 1. No acute intracranial pathology. 2. Chronic microvascular disease and cerebral atrophy. Electronically Signed   By: Elige KoHetal  Patel   On: 04/17/2018 18:22   Dg Chest Portable 1 View  Result Date: 04/17/2018 CLINICAL DATA:  Altered mental status. Dizziness. Headache. Fever. EXAM: PORTABLE CHEST 1 VIEW COMPARISON:  Radiographs 01/10/2014 and 08/30/2012. FINDINGS: 1645 hours. There is stable cardiomegaly and aortic atherosclerosis. The lungs are clear. There is no pleural effusion or pneumothorax. No acute osseous findings are evident. Telemetry leads overlie the chest. IMPRESSION: No active cardiopulmonary process.  Cardiomegaly.  Electronically Signed   By: Carey BullocksWilliam  Veazey M.D.   On: 04/17/2018 17:16    Medications:   . atorvastatin  10 mg Oral Daily  . diltiazem  180 mg Oral Daily  . losartan  12.5 mg Oral Daily  . metoprolol tartrate  25 mg Oral TID  . multivitamin with minerals  1 tablet Oral Daily  . rivaroxaban  20 mg Oral Q supper  . senna-docusate  4 tablet Oral QHS  . sodium chloride flush  3 mL Intravenous Q12H   Continuous Infusions: . sodium chloride    . clindamycin (CLEOCIN) IV 900 mg (04/18/18 0532)     LOS: 0 days   Joseph ArtJessica U Anayelli Lai  [facility]   How to contact the [facility] Attending or Consulting provider 7A - 7P or covering provider during after hours 7P -7A, for this patient?  1. Check the care team in [facility] and look for a) attending/consulting [facility] provider listed and b) the [facility] team listed 2. Log into [email or web address] and use [place]'s universal password to access. If you do not have the password, please contact the hospital operator. 3. Locate the [facility]  provider you are looking for under [facility] and page to a number that you can be directly reached. 4. If you still have difficulty reaching the provider, please page the [facility] (Director on Call) for the Hospitalists listed on amion for assistance.  04/18/2018, 11:59 AM

## 2018-04-18 NOTE — Evaluation (Signed)
Physical Therapy Evaluation Patient Details Name: DASHIELL CHEVERE MRN: [id number] DOB: [birth year] Today's Date: 04/18/2018   History of Present Illness  Mr. Coady is a 73 yo male who presented to the ED after an episode of altered mental status at home; family reports suspected fall. PMH: mental disability, DM, HTN, A-fib, cellulitis.  Clinical Impression  Pt received in bed, willing to participate in therapy. He is a pleasant man, but confused, with perseveration on topics. A&Ox3, but unable to state why he is in the hospital. Spoke to pt's cousin's wife, Waynetta Sandy, on the phone, who reports that she suspects pt fell in the home before his episode of confusion that brought him to the hospital. She reports concern with safety of pt being home alone during the day, while she and her husband are at work. Mobility assessment today limited by pt reporting dizziness upon standing (see details below). Recommending SNF at this time for post-acute rehab to improve pt's independence and safety with functional mobility. Due to pt's recent fall, limited mobility and cognitive deficits, believe that pt is at high risk for falls with serious injury at home. Pt will continue to benefit from skilled acute PT services to progress his functional mobility safely and allow for safe DC.    Follow Up Recommendations SNF    Equipment Recommendations  Other (comment)(TBD based on mobility progression)    Recommendations for Other Services       Precautions / Restrictions Precautions Precautions: Fall Restrictions Weight Bearing Restrictions: No      Mobility  Bed Mobility Overal bed mobility: Needs Assistance Bed Mobility: Sit to Supine;Supine to Sit     Supine to sit: Min assist;HOB elevated Sit to supine: Min guard;HOB elevated   General bed mobility comments: MinA for LE movement, manual cuing for encouragement for pt to sit EOB  Transfers Overall transfer level: Needs assistance Equipment used:  None Transfers: Sit to/from Stand;Lateral/Scoot Transfers Sit to Stand: Min guard        Lateral/Scoot Transfers: Modified independent (Device/Increase time) General transfer comment: Close guard during sit to stand without AD, pt able to scoot laterally in bed without assistance  Ambulation/Gait             General Gait Details: unable to assess due to reports of dizziness upon standing  Stairs            Wheelchair Mobility    Modified Rankin (Stroke Patients Only)       Balance Overall balance assessment: Needs assistance;History of Falls Sitting-balance support: Feet supported;No upper extremity supported Sitting balance-Leahy Scale: Fair         Standing balance comment: unable to assess well due to patient's dizziness                             Pertinent Vitals/Pain Pain Assessment: Faces Faces Pain Scale: No hurt    Home Living Family/patient expects to be discharged to:: Private residence Living Arrangements: Other relatives(cousin and cousin's wife) Available Help at Discharge: Family;Available PRN/intermittently Type of Home: House Home Access: Ramped entrance     Home Layout: One level Home Equipment: Toilet riser;Tub bench;Cane - single point Additional Comments: Pt's cousin and cousin's wife both work during the day    Prior Function Level of Independence: Needs assistance   Gait / Transfers Assistance Needed: uses cane for ambulation; drags R leg per cousin's wife  ADL's / Homemaking Assistance Needed: assistance with bathing and  dressing        Hand Dominance        Extremity/Trunk Assessment   Upper Extremity Assessment Upper Extremity Assessment: Overall WFL for tasks assessed    Lower Extremity Assessment Lower Extremity Assessment: Overall WFL for tasks assessed    Cervical / Trunk Assessment Cervical / Trunk Assessment: Kyphotic  Communication   Communication: HOH  Cognition Arousal/Alertness:  Awake/alert Behavior During Therapy: WFL for tasks assessed/performed Overall Cognitive Status: History of cognitive impairments - at baseline                                 General Comments: Pt with mental disability at baseline, but family reports perseveration on words is not baseline for pt. Pt perseverating heavily. Able to state his needs.      General Comments General comments (skin integrity, edema, etc.): Pt A&Ox3, perseverates on repeating his name and the current year after being asked    Exercises     Assessment/Plan    PT Assessment Patient needs continued PT services  PT Problem List Decreased strength;Decreased balance;Decreased cognition;Decreased knowledge of precautions;Decreased mobility;Decreased knowledge of use of DME;Decreased activity tolerance;Decreased coordination;Decreased safety awareness       PT Treatment Interventions DME instruction;Balance training;Functional mobility training;Patient/family education;Gait training;Therapeutic activities;Neuromuscular re-education;Therapeutic exercise;Cognitive remediation    PT Goals (Current goals can be found in the Care Plan section)  Acute Rehab PT Goals Patient Stated Goal: pt did not state goal PT Goal Formulation: Patient unable to participate in goal setting Time For Goal Achievement: 05/02/18 Potential to Achieve Goals: Fair    Frequency Min 2X/week   Barriers to discharge Decreased caregiver support caregivers not home during the day    Co-evaluation               AM-PAC PT "6 Clicks" Mobility  Outcome Measure Help needed turning from your back to your side while in a flat bed without using bedrails?: None Help needed moving from lying on your back to sitting on the side of a flat bed without using bedrails?: A Little Help needed moving to and from a bed to a chair (including a wheelchair)?: A Little Help needed standing up from a chair using your arms (e.g., wheelchair or  bedside chair)?: A Little Help needed to walk in hospital room?: A Lot Help needed climbing 3-5 steps with a railing? : A Lot 6 Click Score: 17    End of Session   Activity Tolerance: Other (comment)(limited by dizziness upon standing) Patient left: in bed;with call bell/phone within reach;with bed alarm set Nurse Communication: Mobility status PT Visit Diagnosis: History of falling (Z91.81);Muscle weakness (generalized) (M62.81);Other abnormalities of gait and mobility (R26.89)    Time:  -      Charges:              Halina Andreas, SPT   Halina Andreas 04/18/2018, 12:15 PM

## 2018-04-18 NOTE — NC FL2 (Addendum)
[place] MEDICAID FL2 LEVEL OF CARE SCREENING TOOL     IDENTIFICATION  Patient Name: Nicholas Hardin Birthdate: 1945/10/24 Sex: male Admission Date (Current Location): 04/17/2018  [facility] and [place] Number:  Producer, television/film/video and Address:  [facility] [place], [address] [place], [place] [address]      Provider Number: [id number]  Attending Physician Name and Address:  Joseph Art, DO  Relative Name and Phone Number:       Current Level of Care: Hospital Recommended Level of Care: Skilled Nursing Facility Prior Approval Number:    Date Approved/Denied:  04/19/2018 PASRR Number: [id number]   Discharge Plan: SNF    Current Diagnoses: Patient Active Problem List   Diagnosis Date Noted  . Acute metabolic encephalopathy 04/17/2018  . Cellulitis 04/17/2018  . Atrial fibrillation (HCC) 03/15/2016  . Metabolic syndrome 05/24/2014  . Diabetes mellitus type 2, uncomplicated (HCC) 01/10/2014  . Essential hypertension 01/10/2014  . Hyperlipidemia LDL goal <70 09/21/2013  . Mental disability 06/15/2013  . Type 2 diabetes mellitus with peripheral circulatory disorder (HCC) 12/15/2012  . Vitamin B12 deficiency 12/15/2012  . 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 Essential hypertension, benign 12/15/2012  . Polypharmacy 12/15/2012    Orientation RESPIRATION BLADDER Height & Weight     Self  Normal Incontinent Weight:   Height:     BEHAVIORAL SYMPTOMS/MOOD NEUROLOGICAL BOWEL NUTRITION STATUS      Continent Diet(see DC summary)  AMBULATORY STATUS COMMUNICATION OF NEEDS Skin   Limited Assist Verbally Normal                       Personal Care Assistance Level of Assistance  Bathing, Feeding, Dressing Bathing Assistance: Limited assistance Feeding assistance: Limited assistance Dressing Assistance: Limited assistance     Functional Limitations Info  Sight, Hearing, Speech Sight Info:  Adequate Hearing Info: Adequate Speech Info: Adequate    SPECIAL CARE FACTORS FREQUENCY  PT (By licensed PT), OT (By licensed OT)     PT Frequency: 5x/wk OT Frequency: 5x/wk            Contractures Contractures Info: Not present    Additional Factors Info  Code Status, Allergies Code Status Info: Full Allergies Info: Sulfa Antibiotics           Current Medications (04/18/2018):  This is the current hospital active medication list Current Facility-Administered Medications  Medication Dose Route Frequency Provider Last Rate Last Dose  . 0.9 %  sodium chloride infusion  250 mL Intravenous PRN Haydee Monica, MD      . acetaminophen (TYLENOL) tablet 650 mg  650 mg Oral Q6H PRN Haydee Monica, MD       Or  . acetaminophen (TYLENOL) suppository 650 mg  650 mg Rectal Q6H PRN Haydee Monica, MD      . atorvastatin (LIPITOR) tablet 10 mg  10 mg Oral Daily Tarry Kos A, MD   10 mg at 04/18/18 1031  . clindamycin (CLEOCIN) IVPB 900 mg  900 mg Intravenous Q8H Tarry Kos A, MD 100 mL/hr at 04/18/18 1306 900 mg at 04/18/18 1306  . diltiazem (CARDIZEM CD) 24 hr capsule 180 mg  180 mg Oral Daily Tarry Kos A, MD   180 mg at 04/18/18 1031  . losartan (COZAAR) tablet 12.5 mg  12.5 mg Oral Daily Tarry Kos A, MD   12.5 mg at 04/18/18 1032  . metoprolol tartrate (LOPRESSOR) tablet  25 mg  25 mg Oral TID Haydee Monica, MD   25 mg at 04/18/18 1032  . multivitamin with minerals tablet 1 tablet  1 tablet Oral Daily Haydee Monica, MD   1 tablet at 04/18/18 1031  . ondansetron (ZOFRAN) tablet 4 mg  4 mg Oral Q6H PRN Haydee Monica, MD       Or  . ondansetron [facility]) injection 4 mg  4 mg Intravenous Q6H PRN Haydee Monica, MD      . rivaroxaban Carlena Hurl) tablet 20 mg  20 mg Oral Q supper Haydee Monica, MD   20 mg at 04/17/18 2241  . senna-docusate (Senokot-S) tablet 4 tablet  4 tablet Oral QHS Haydee Monica, MD   4 tablet at 04/17/18 2215  . sodium chloride flush (NS) 0.9 %  injection 3 mL  3 mL Intravenous Q12H Tarry Kos A, MD   3 mL at 04/18/18 1034  . sodium chloride flush (NS) 0.9 % injection 3 mL  3 mL Intravenous PRN Haydee Monica, MD         Discharge Medications: Please see discharge summary for a list of discharge medications.  Relevant Imaging Results:  Relevant Lab Results:   Additional Information SS#: [id number]  Baldemar Lenis, LCSW

## 2018-04-18 NOTE — Telephone Encounter (Signed)
Requested medication (s) are due for refill today: yes  Requested medication (s) are on the active medication list: yes  Last refill:  01/19/18  Future visit scheduled: no  Notes to clinic:  Former pt of Dr Norberto Sorenson    Requested Prescriptions  Pending Prescriptions Disp Refills   diltiazem (TIAZAC) 180 MG 24 hr capsule [Pharmacy Med Name: DILTIAZEM ER 180MG  CAPSULES (24 HR)] 90 capsule 0    Sig: TAKE 1 CAPSULE BY MOUTH EVERY DAY     Cardiovascular:  Calcium Channel Blockers Failed - 04/18/2018  3:21 AM      Failed - Valid encounter within last 6 months    Recent Outpatient Visits          1 year ago Type 2 diabetes mellitus with peripheral circulatory disorder [facility])   [facility], Levell July, MD   1 year ago Essential hypertension, benign   [facility], Levell July, MD   2 years ago Type 2 diabetes mellitus without complication, without long-term current use of insulin [facility])   [facility], Levell July, MD   3 years ago Medicare annual wellness visit, subsequent   [facility], Levell July, MD   3 years ago Metabolic syndrome   [facility], Levell July, MD             Passed - Last BP in normal range    BP Readings from Last 1 Encounters:  04/18/18 128/76

## 2018-04-19 DIAGNOSIS — L03115 Cellulitis of right lower limb: Secondary | ICD-10-CM | POA: Diagnosis not present

## 2018-04-19 DIAGNOSIS — M2550 Pain in unspecified joint: Secondary | ICD-10-CM | POA: Diagnosis not present

## 2018-04-19 DIAGNOSIS — I482 Chronic atrial fibrillation, unspecified: Secondary | ICD-10-CM | POA: Diagnosis present

## 2018-04-19 DIAGNOSIS — R410 Disorientation, unspecified: Secondary | ICD-10-CM | POA: Diagnosis present

## 2018-04-19 DIAGNOSIS — I499 Cardiac arrhythmia, unspecified: Secondary | ICD-10-CM | POA: Diagnosis not present

## 2018-04-19 DIAGNOSIS — Z841 Family history of disorders of kidney and ureter: Secondary | ICD-10-CM | POA: Diagnosis not present

## 2018-04-19 DIAGNOSIS — M6281 Muscle weakness (generalized): Secondary | ICD-10-CM | POA: Diagnosis not present

## 2018-04-19 DIAGNOSIS — Z79899 Other long term (current) drug therapy: Secondary | ICD-10-CM | POA: Diagnosis not present

## 2018-04-19 DIAGNOSIS — R4182 Altered mental status, unspecified: Secondary | ICD-10-CM | POA: Diagnosis present

## 2018-04-19 DIAGNOSIS — Z7984 Long term (current) use of oral hypoglycemic drugs: Secondary | ICD-10-CM | POA: Diagnosis not present

## 2018-04-19 DIAGNOSIS — I4891 Unspecified atrial fibrillation: Secondary | ICD-10-CM | POA: Diagnosis not present

## 2018-04-19 DIAGNOSIS — E1151 Type 2 diabetes mellitus with diabetic peripheral angiopathy without gangrene: Secondary | ICD-10-CM | POA: Diagnosis present

## 2018-04-19 DIAGNOSIS — Z7401 Bed confinement status: Secondary | ICD-10-CM | POA: Diagnosis not present

## 2018-04-19 DIAGNOSIS — I10 Essential (primary) hypertension: Secondary | ICD-10-CM

## 2018-04-19 DIAGNOSIS — L0390 Cellulitis, unspecified: Secondary | ICD-10-CM | POA: Diagnosis not present

## 2018-04-19 DIAGNOSIS — F79 Unspecified intellectual disabilities: Secondary | ICD-10-CM

## 2018-04-19 DIAGNOSIS — Z7901 Long term (current) use of anticoagulants: Secondary | ICD-10-CM | POA: Diagnosis not present

## 2018-04-19 DIAGNOSIS — E1143 Type 2 diabetes mellitus with diabetic autonomic (poly)neuropathy: Secondary | ICD-10-CM | POA: Diagnosis not present

## 2018-04-19 DIAGNOSIS — Z882 Allergy status to sulfonamides status: Secondary | ICD-10-CM | POA: Diagnosis not present

## 2018-04-19 DIAGNOSIS — E785 Hyperlipidemia, unspecified: Secondary | ICD-10-CM | POA: Diagnosis present

## 2018-04-19 DIAGNOSIS — R2681 Unsteadiness on feet: Secondary | ICD-10-CM | POA: Diagnosis not present

## 2018-04-19 DIAGNOSIS — G9341 Metabolic encephalopathy: Secondary | ICD-10-CM | POA: Diagnosis not present

## 2018-04-19 DIAGNOSIS — Z8249 Family history of ischemic heart disease and other diseases of the circulatory system: Secondary | ICD-10-CM | POA: Diagnosis not present

## 2018-04-19 DIAGNOSIS — R2689 Other abnormalities of gait and mobility: Secondary | ICD-10-CM | POA: Diagnosis not present

## 2018-04-19 DIAGNOSIS — R1110 Vomiting, unspecified: Secondary | ICD-10-CM | POA: Diagnosis not present

## 2018-04-19 DIAGNOSIS — R1312 Dysphagia, oropharyngeal phase: Secondary | ICD-10-CM | POA: Diagnosis not present

## 2018-04-19 DIAGNOSIS — E876 Hypokalemia: Secondary | ICD-10-CM | POA: Diagnosis not present

## 2018-04-19 LAB — CBC
HCT: 34.7 % — ABNORMAL LOW (ref 39.0–52.0)
Hemoglobin: 11.3 g/dL — ABNORMAL LOW (ref 13.0–17.0)
MCH: 28 pg (ref 26.0–34.0)
MCHC: 32.6 g/dL (ref 30.0–36.0)
MCV: 86.1 fL (ref 80.0–100.0)
NRBC: 0 % (ref 0.0–0.2)
Platelets: 295 10*3/uL (ref 150–400)
RBC: 4.03 MIL/uL — ABNORMAL LOW (ref 4.22–5.81)
RDW: 12.9 % (ref 11.5–15.5)
WBC: 10.5 10*3/uL (ref 4.0–10.5)

## 2018-04-19 LAB — BASIC METABOLIC PANEL
ANION GAP: 9 (ref 5–15)
BUN: 7 mg/dL — ABNORMAL LOW (ref 8–23)
CALCIUM: 9.4 mg/dL (ref 8.9–10.3)
CO2: 27 mmol/L (ref 22–32)
Chloride: 106 mmol/L (ref 98–111)
Creatinine, Ser: 0.9 mg/dL (ref 0.61–1.24)
GFR calc Af Amer: >60 mL/min (ref >60)
GFR calc non Af Amer: >60 mL/min (ref >60)
Glucose, Bld: 170 mg/dL — ABNORMAL HIGH (ref 70–99)
Potassium: 3.5 mmol/L (ref 3.5–5.1)
Sodium: 142 mmol/L (ref 135–145)

## 2018-04-19 LAB — GLUCOSE, CAPILLARY
Glucose-Capillary: 188 mg/dL — ABNORMAL HIGH (ref 70–99)
Glucose-Capillary: 174 mg/dL — ABNORMAL HIGH (ref 70–99)
Glucose-Capillary: 260 mg/dL — ABNORMAL HIGH (ref 70–99)

## 2018-04-19 LAB — MAGNESIUM: Magnesium: 2 mg/dL (ref 1.7–2.4)

## 2018-04-19 MED ORDER — POTASSIUM CHLORIDE CRYS ER 20 MEQ PO TBCR
40.0000 meq | EXTENDED_RELEASE_TABLET | Freq: Once | ORAL | Status: AC
  Administered 2018-04-19: 40 meq via ORAL
  Filled 2018-04-19: qty 2

## 2018-04-19 MED ORDER — CLINDAMYCIN HCL 300 MG PO CAPS
300.0000 mg | ORAL_CAPSULE | Freq: Three times a day (TID) | ORAL | Status: DC
  Administered 2018-04-20 – 2018-04-22 (×8): 300 mg via ORAL
  Filled 2018-04-19 (×8): qty 1

## 2018-04-19 MED ORDER — INSULIN ASPART 100 UNIT/ML ~~LOC~~ SOLN
0.0000 [IU] | Freq: Three times a day (TID) | SUBCUTANEOUS | Status: DC
  Administered 2018-04-19 – 2018-04-20 (×4): 2 [IU] via SUBCUTANEOUS
  Administered 2018-04-20: 3 [IU] via SUBCUTANEOUS
  Administered 2018-04-21: 2 [IU] via SUBCUTANEOUS
  Administered 2018-04-21: 3 [IU] via SUBCUTANEOUS
  Administered 2018-04-21: 2 [IU] via SUBCUTANEOUS
  Administered 2018-04-22: 5 [IU] via SUBCUTANEOUS

## 2018-04-19 MED ORDER — CLINDAMYCIN PHOSPHATE 900 MG/50ML IV SOLN
900.0000 mg | Freq: Three times a day (TID) | INTRAVENOUS | Status: AC
  Administered 2018-04-19: 900 mg via INTRAVENOUS
  Filled 2018-04-19: qty 50

## 2018-04-19 NOTE — Care Management Obs Status (Signed)
MEDICARE OBSERVATION STATUS NOTIFICATION   Patient Details  Name: Nicholas Hardin MRN: [id number] Date of Birth: Feb 09, 1945   Medicare Observation Status Notification Given:  Yes    Kermit Balo, RN 04/18/2018, [phone]

## 2018-04-19 NOTE — Evaluation (Signed)
Occupational Therapy Evaluation Patient Details Name: Nicholas Hardin MRN: [id number] DOB: July 21, 1945 Today's Date: 04/19/2018    History of Present Illness Nicholas Hardin is a 73 yo male who presented to the ED after an episode of altered mental status at home; family reports suspected fall. PMH: mental disability, DM, HTN, A-fib, cellulitis.   Clinical Impression   Pt admitted for above and limited by problem list below, including safety, impaired balance and generalized weakness.  Pt reports he lives with family who work during the day, he reports he was able to complete self care without assist (by PT spoke to cousin and reports he needs assist)--noted baseline cognitive impairments.  Today, pt able to follow simple 1 step commands but presents with poor safety awareness and memory.  He completes transfers with min assist, toileting with total assist, UB ADLs with min assist and LB ADLs with mod-max assist; pt requires +2 for safety during mobility as pt attempt to carry walker and cane during session.  Patient will benefit from continued OT services while admitted and after dc at SNF in order to optimize independence and safety with ADLs/mobility.      Follow Up Recommendations  SNF;Supervision/Assistance - 24 hour    Equipment Recommendations  Other (comment)(TBD at next venue of care)    Recommendations for Other Services       Precautions / Restrictions Precautions Precautions: Fall Restrictions Weight Bearing Restrictions: No      Mobility Bed Mobility Overal bed mobility: Needs Assistance Bed Mobility: Supine to Sit     Supine to sit: Min guard;HOB elevated     General bed mobility comments: min guard for safety   Transfers Overall transfer level: Needs assistance Equipment used: None Transfers: Sit to/from Stand Sit to Stand: Min assist         General transfer comment: min assist for safety and balance, pt attempts to use RW/cane but carries instead of using  functionally     Balance Overall balance assessment: Needs assistance;History of Falls Sitting-balance support: Feet supported;No upper extremity supported Sitting balance-Leahy Scale: Fair     Standing balance support: No upper extremity supported;During functional activity Standing balance-Leahy Scale: Poor Standing balance comment: reliant on UE and external support                            ADL either performed or assessed with clinical judgement   ADL Overall ADL's : Needs assistance/impaired     Grooming: Minimal assistance;Sitting   Upper Body Bathing: Minimal assistance;Sitting   Lower Body Bathing: Maximal assistance;Sit to/from stand   Upper Body Dressing : Moderate assistance;Sitting   Lower Body Dressing: Maximal assistance;Sit to/from stand   Toilet Transfer: Minimal assistance;+2 for safety/equipment;BSC;Cueing for safety;Cueing for sequencing;Ambulation Toilet Transfer Details (indicate cue type and reason): poor safety awarness and poor use of DME (cane and RW)  Toileting- Clothing Manipulation and Hygiene: Total assistance;Sit to/from stand Toileting - Clothing Manipulation Details (indicate cue type and reason): assist for hygiene after soiled BM in bed     Functional mobility during ADLs: Minimal assistance;Cueing for safety;Cueing for sequencing(attempted cane and RW, pt carried both) General ADL Comments: pt limited by impaired balance, cognition and safety      Vision   Additional Comments: further asssement required, noted dysconjugate with L eye towards L      Perception     Praxis      Pertinent Vitals/Pain Pain Assessment: Faces Faces Pain Scale:  No hurt     Hand Dominance     Extremity/Trunk Assessment Upper Extremity Assessment Upper Extremity Assessment: Overall WFL for tasks assessed   Lower Extremity Assessment Lower Extremity Assessment: Defer to PT evaluation   Cervical / Trunk Assessment Cervical / Trunk  Assessment: Kyphotic   Communication Communication Communication: HOH   Cognition Arousal/Alertness: Awake/alert Behavior During Therapy: WFL for tasks assessed/performed Overall Cognitive Status: History of cognitive impairments - at baseline                                 General Comments: pt with mental distability at baseline, able to follow 1 step commands and oriented to person, place; decreased safety awareness throughout session    General Comments       Exercises     Shoulder Instructions      Home Living Family/patient expects to be discharged to:: Private residence Living Arrangements: Other relatives(cousin and cousin's wife) Available Help at Discharge: Family;Available PRN/intermittently(work during the day ) Type of Home: House Home Access: Ramped entrance     Home Layout: One level     Bathroom Shower/Tub: Chief Strategy Officer: Standard     Home Equipment: Toilet riser;Tub bench;Cane - single point   Additional Comments: Pt's cousin and cousin's wife both work during the day      Prior Functioning/Environment Level of Independence: Needs assistance  Gait / Transfers Assistance Needed: uses cane for ambulation; drags R leg per cousin's wife ADL's / Homemaking Assistance Needed: assistance with bathing and dressing- per PT eval and speaking to cousin but pt reports no assist needed            OT Problem List: Decreased strength;Impaired balance (sitting and/or standing);Decreased coordination;Decreased cognition;Decreased safety awareness;Decreased activity tolerance;Decreased knowledge of use of DME or AE;Decreased knowledge of precautions      OT Treatment/Interventions: Self-care/ADL training;Therapeutic exercise;DME and/or AE instruction;Therapeutic activities;Balance training;Patient/family education    OT Goals(Current goals can be found in the care plan section) Acute Rehab OT Goals Patient Stated Goal: pt did  not state goal Time For Goal Achievement: 05/03/18 Potential to Achieve Goals: Good  OT Frequency: Min 2X/week   Barriers to D/C:            Co-evaluation              AM-PAC OT "6 Clicks" Daily Activity     Outcome Measure Help from another person eating meals?: A Little Help from another person taking care of personal grooming?: A Little Help from another person toileting, which includes using toliet, bedpan, or urinal?: Total Help from another person bathing (including washing, rinsing, drying)?: A Lot Help from another person to put on and taking off regular upper body clothing?: A Little Help from another person to put on and taking off regular lower body clothing?: A Lot 6 Click Score: 14   End of Session Equipment Utilized During Treatment: Gait belt;Rolling walker;Other (comment)(cane) Nurse Communication: Mobility status;Precautions  Activity Tolerance: Patient tolerated treatment well Patient left: in chair;with call bell/phone within reach;with chair alarm set;with nursing/sitter in room  OT Visit Diagnosis: Other abnormalities of gait and mobility (R26.89);Other symptoms and signs involving cognitive function;Muscle weakness (generalized) (M62.81)                Time: 1517-6160 OT Time Calculation (min): 27 min Charges:  OT General Charges $OT Visit: 1 Visit OT Evaluation $OT Eval Moderate Complexity:  1 Mod OT Treatments $Self Care/Home Management : 8-22 mins  Chancy Milroy, OT Acute Rehabilitation Services Pager [phone] Office [phone]    Chancy Milroy 04/19/2018, 10:17 AM

## 2018-04-19 NOTE — Progress Notes (Signed)
[facility] PROGRESS NOTE  Nicholas Hardin QQV:[id number] DOB: 11-17-1945 DOA: 04/17/2018 PCP: Sherren Mocha, MD  Assessment/Plan:  Acute metabolic encephalopathy. Appears stable at baseline -likely due to infection  -cleocin day #3  Cellulitis. Family reports right shin erythema "off and on for years". No swelling or tenderness. Afebrile and non-toxic appearing. -placed on IV clindamycin as noted above -appears to have small tear on right shin  Type 2 diabetes mellitus with peripheral circulatory disorder. Fair control (HCC) -holding metformin -HgbA1c 6.8 -added SSI this am  Mental disability -unsure of baseline but able to answer questions and follow commands -Updated nephew who indicates placement is desired  Essential hypertension. Fair control -continue home meds  Atrial fibrillation (HCC) rate controlled -continue Xarelto  Hypokalemia. Low end of normal -replete again -recheck in am -check in am   Cellulitis and underlying comorbidity put him at increased risk for adverse outcome and cannot be safely treated as OP. Will require IV antibiotics and close monitoring for response to treatment    Code Status: full Family Communication: spoke with nephew who indicates placement desired Disposition Plan: to be determined   Consultants:  none  Procedures:  none  Antibiotics:  Cleocin 3/16 >>>  HPI/Subjective: Nicholas Hardin is an 73 y.o. male with medical history significant ofdisability, diabetes, hypertension brought in by family members late evening 04/17/18 because of confusion that was uncharacteristic for his baseline. found to have cellulitis  Objective: Vitals:   04/19/18 0319 04/19/18 0809  BP: 133/85 134/75  Pulse: 86 98  Resp: 20 16  Temp: 97.9 F (36.6 C) (!) 97.5 F (36.4 C)  SpO2: 93% 96%    Intake/Output Summary (Last 24 hours) at 04/19/2018 1004 Last data filed at 04/19/2018 0400 Gross per 24 hour  Intake 583 ml   Output 1200 ml  Net -617 ml   There were no vitals filed for this visit.  Exam:   General:  Sitting on side of bed talking to OT therapist smiling in no acute distress  Cardiovascular: irregularly irregular no mgr trace LE edema on right. Right shin with erythema, heat swelling. Dime size skin tear. No odor or drainage  Respiratory: normal effort BS clear bilaterally no wheeze  Abdomen: obese soft +BS no guarding or rebounding   Musculoskeletal: joints without swelling/erythema   Data Reviewed: Basic Metabolic Panel: Recent Labs  Lab 04/17/18 1653 04/18/18 0423 04/19/18 0413  NA 140 140 142  K 3.6 3.4* 3.5  CL 105 105 106  CO2 23 26 27   GLUCOSE 173* 128* 170*  BUN 13 10 7*  CREATININE 0.80 0.89 0.90  CALCIUM 9.2 9.1 9.4   Liver Function Tests: Recent Labs  Lab 04/17/18 1653  AST 18  ALT 13  ALKPHOS 64  BILITOT 0.8  PROT 6.4*  ALBUMIN 3.6   No results for input(s): LIPASE, AMYLASE in the last 168 hours. Recent Labs  Lab 04/17/18 1653  AMMONIA 15   CBC: Recent Labs  Lab 04/17/18 1653 04/18/18 0423 04/19/18 0413  WBC 12.3* 10.3 10.5  NEUTROABS 10.0*  --   --   HGB 11.1* 10.7* 11.3*  HCT 35.4* 33.1* 34.7*  MCV 88.9 86.4 86.1  PLT 278 262 295   Cardiac Enzymes: No results for input(s): CKTOTAL, CKMB, CKMBINDEX, TROPONINI in the last 168 hours. BNP (last 3 results) No results for input(s): BNP in the last 8760 hours.  ProBNP (last 3 results) No results for input(s): PROBNP in the last 8760 hours.  CBG: No  results for input(s): GLUCAP in the last 168 hours.  Recent Results (from the past 240 hour(s))  Gram stain     Status: None   Collection Time: 04/17/18  4:53 PM  Result Value Ref Range Status   Specimen Description URINE, CLEAN CATCH  Final   Special Requests NONE  Final   Gram Stain   Final    WBC PRESENT,BOTH PMN AND MONONUCLEAR GRAM POSITIVE COCCI GRAM VARIABLE ROD Gram Stain Report Called to,Read Back By and Verified With: Higinio Roger RN [phone] 04/17/18 A BROWNING Performed at [facility], [address] [place], [place] [address]    Report Status 04/17/2018 FINAL  Final     Studies: Ct Head Wo Contrast  Result Date: 04/17/2018 CLINICAL DATA:  Altered mental status, repetitive questioning EXAM: CT HEAD WITHOUT CONTRAST TECHNIQUE: Contiguous axial images were obtained from the base of the skull through the vertex without intravenous contrast. COMPARISON:  02/02/2013 FINDINGS: Brain: No evidence of acute infarction, hemorrhage, extra-axial collection, ventriculomegaly, or mass effect. Generalized cerebral atrophy. Periventricular white matter low attenuation likely secondary to microangiopathy. Vascular: Cerebrovascular atherosclerotic calcifications are noted. Skull: Negative for fracture or focal lesion. Sinuses/Orbits: Visualized portions of the orbits are unremarkable. Visualized portions of the paranasal sinuses and mastoid air cells are unremarkable. Other: None. IMPRESSION: 1. No acute intracranial pathology. 2. Chronic microvascular disease and cerebral atrophy. Electronically Signed   By: Elige Ko   On: 04/17/2018 18:22   Dg Chest Portable 1 View  Result Date: 04/17/2018 CLINICAL DATA:  Altered mental status. Dizziness. Headache. Fever. EXAM: PORTABLE CHEST 1 VIEW COMPARISON:  Radiographs 01/10/2014 and 08/30/2012. FINDINGS: 1645 hours. There is stable cardiomegaly and aortic atherosclerosis. The lungs are clear. There is no pleural effusion or pneumothorax. No acute osseous findings are evident. Telemetry leads overlie the chest. IMPRESSION: No active cardiopulmonary process.  Cardiomegaly. Electronically Signed   By: Carey Bullocks M.D.   On: 04/17/2018 17:16    Scheduled Meds: . atorvastatin  10 mg Oral Daily  . diltiazem  180 mg Oral Daily  . losartan  12.5 mg Oral Daily  . metoprolol tartrate  25 mg Oral TID  . multivitamin with minerals  1 tablet Oral Daily  . rivaroxaban  20 mg Oral Q supper   . senna-docusate  4 tablet Oral QHS  . sodium chloride flush  3 mL Intravenous Q12H   Continuous Infusions: . sodium chloride    . clindamycin (CLEOCIN) IV 900 mg (04/19/18 0556)    Principal Problem:   Acute metabolic encephalopathy Active Problems:   Type 2 diabetes mellitus with peripheral circulatory disorder (HCC)   Mental disability   Essential hypertension   Atrial fibrillation (HCC)   Cellulitis    Time spent: 45 minutes    [facility] M NP  [facility]  If 7PM-7AM, please contact night-coverage at [email or web address], password [facility] 04/19/2018, 10:04 AM  LOS: 0 days

## 2018-04-19 NOTE — Progress Notes (Signed)
Patient with CBG of 260, sugar was checked within 10 mins of patient finishing his dinner.

## 2018-04-19 NOTE — Progress Notes (Signed)
Received phone call from pts cousin in law that they are working toward getting him medicaid. CM went over that finding him an ALF early in the rehab stay is better so they will have him a place to transition once he is done with rehab. She voiced understanding.  CM provided her choice of the SNF rehabs that offered a bed and she selected [facility]. CSW updated.

## 2018-04-19 NOTE — TOC Initial Note (Addendum)
Transition of Care [facility]) - Initial/Assessment Note    Patient Details  Name: Nicholas Hardin MRN: [id number] Date of Birth: 1945-08-09  Transition of [facility]) CM/SW Contact:    Kermit Balo, RN Phone Number: 04/19/2018, 9:09 AM  Clinical Narrative:                 CM spoke to patients cousin, Jillyn Hidden on the phone and they are interested in having patient go to SNF rehab. They are agreeable to him being faxed out in [facility]. They are working on LT placement in ALF after rehab. Gary's wife is attempting to obtain Medicaid for the patient this week.   Expected Discharge Plan: Skilled Nursing Facility Barriers to Discharge: Continued Medical Work up   Patient Goals and CMS Choice Patient states their goals for this hospitalization and ongoing recovery are:: pt unable to state      Expected Discharge Plan and Services Expected Discharge Plan: Skilled Nursing Facility Discharge Planning Services: CM Consult   Living arrangements for the past 2 months: (has a ramp)                          Prior Living Arrangements/Services Living arrangements for the past 2 months: (has a ramp) Lives with:: Other (Comment)(Cousin and his wife)   Do you feel safe going back to the place where you live?: (unable to assess)      Need for Family Participation in Patient Care: Yes (Comment)(pt with confusion) Care giver support system in place?: Yes (comment)(cousin Jillyn Hidden and his wife) Current home services: DME(cane, walker, shower chair) Criminal Activity/Legal Involvement Pertinent to Current Situation/Hospitalization: No - Comment as needed  Activities of Daily Living      Permission Sought/Granted                  Emotional Assessment Appearance:: Appears stated age Attitude/Demeanor/Rapport: Unable to Assess(pt confused) Affect (typically observed): Happy, Pleasant Orientation: : (confused) Alcohol / Substance Use: Never Used Psych Involvement: No (comment)  Admission  diagnosis:  Altered mental status, unspecified altered mental status type [R41.82] Patient Active Problem List   Diagnosis Date Noted  . Acute metabolic encephalopathy 04/17/2018  . Cellulitis 04/17/2018  . Atrial fibrillation (HCC) 03/15/2016  . Metabolic syndrome 05/24/2014  . Diabetes mellitus type 2, uncomplicated (HCC) 01/10/2014  . Essential hypertension 01/10/2014  . Hyperlipidemia LDL goal <70 09/21/2013  . Mental disability 06/15/2013  . Type 2 diabetes mellitus with peripheral circulatory disorder (HCC) 12/15/2012  . Vitamin B12 deficiency 12/15/2012  . 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 Essential hypertension, benign 12/15/2012  . Polypharmacy 12/15/2012   PCP:  Sherren Mocha, MD Pharmacy:   [facility] [phone] [place], [place] - [phone] AT [facility] [address] [facility] [address] [place] [address] Phone: [phone] Fax: [phone]     Social Determinants of Health (SDOH) Interventions    Readmission Risk Interventions  No flowsheet data found.

## 2018-04-19 NOTE — Discharge Instructions (Signed)

## 2018-04-20 LAB — GLUCOSE, CAPILLARY
Glucose-Capillary: 238 mg/dL — ABNORMAL HIGH (ref 70–99)
Glucose-Capillary: 195 mg/dL — ABNORMAL HIGH (ref 70–99)
Glucose-Capillary: 164 mg/dL — ABNORMAL HIGH (ref 70–99)
Glucose-Capillary: 204 mg/dL — ABNORMAL HIGH (ref 70–99)

## 2018-04-20 LAB — BASIC METABOLIC PANEL
Anion gap: 12 (ref 5–15)
BUN: 10 mg/dL (ref 8–23)
CO2: 20 mmol/L — ABNORMAL LOW (ref 22–32)
Calcium: 8.6 mg/dL — ABNORMAL LOW (ref 8.9–10.3)
Chloride: 105 mmol/L (ref 98–111)
Creatinine, Ser: 0.97 mg/dL (ref 0.61–1.24)
GFR calc Af Amer: >60 mL/min (ref >60)
GFR calc non Af Amer: >60 mL/min (ref >60)
Glucose, Bld: 242 mg/dL — ABNORMAL HIGH (ref 70–99)
Potassium: 3.2 mmol/L — ABNORMAL LOW (ref 3.5–5.1)
Sodium: 137 mmol/L (ref 135–145)

## 2018-04-20 MED ORDER — SACCHAROMYCES BOULARDII 250 MG PO CAPS
250.0000 mg | ORAL_CAPSULE | Freq: Two times a day (BID) | ORAL | Status: DC
  Administered 2018-04-20 – 2018-04-22 (×5): 250 mg via ORAL
  Filled 2018-04-20 (×5): qty 1

## 2018-04-20 MED ORDER — POTASSIUM CHLORIDE 10 MEQ/100ML IV SOLN
10.0000 meq | INTRAVENOUS | Status: AC
  Administered 2018-04-20 (×2): 10 meq via INTRAVENOUS
  Filled 2018-04-20 (×4): qty 100

## 2018-04-20 NOTE — Progress Notes (Signed)
Physical Therapy Treatment Patient Details Name: Nicholas Hardin MRN: [id number] DOB: [birth year] Today's Date: 04/20/2018    History of Present Illness Nicholas Hardin is a 73 yo male who presented to the ED after an episode of altered mental status at home; family reports suspected fall. PMH: mental disability, DM, HTN, A-fib, cellulitis.    PT Comments    Pt is min guard for bed mobility and min A for transfers to help balance initially. Pt able to stand and perform gait much easier with RW vs SPC. Pt remains limited d/t cognitive deficits and impulsiveness during activities. Pt vitals normal throughout (BP supine 114/69, EOB 122/90, post-gait 120/73). Plan to DC to SNF remains appropriate at this time. Plan to progress gait and transfer training.   Follow Up Recommendations  SNF     Equipment Recommendations  Other (comment)    Recommendations for Other Services       Precautions / Restrictions Precautions Precautions: Fall Restrictions Weight Bearing Restrictions: No    Mobility  Bed Mobility Overal bed mobility: Needs Assistance Bed Mobility: Supine to Sit     Supine to sit: Min guard;HOB elevated     General bed mobility comments: min guard for safety   Transfers Overall transfer level: Needs assistance Equipment used: Rolling walker (2 wheeled) Transfers: Sit to/from Stand Sit to Stand: Min assist         General transfer comment: min assist for safety and balance with cues for hand placement.   Ambulation/Gait Ambulation/Gait assistance: Min assist Gait Distance (Feet): 100 Feet Assistive device: Rolling walker (2 wheeled) Gait Pattern/deviations: Decreased step length - right;Step-to pattern;Drifts right/left Gait velocity: decreased   General Gait Details: Pt was unable to safely use cane upon standing and stated dizziness. Pt educated on use of RW and he stated he was more steady and not so dizzy. Pt required min A for walker safety and negotiating obstacles  as he drifts left and right and allows the walker to get out too far away.    Stairs             Wheelchair Mobility    Modified Rankin (Stroke Patients Only)       Balance Overall balance assessment: Needs assistance;History of Falls Sitting-balance support: Feet supported;No upper extremity supported Sitting balance-Leahy Scale: Fair     Standing balance support: No upper extremity supported;During functional activity Standing balance-Leahy Scale: Poor Standing balance comment: reliant on UE and external support                             Cognition Arousal/Alertness: Awake/alert Behavior During Therapy: WFL for tasks assessed/performed Overall Cognitive Status: History of cognitive impairments - at baseline                                 General Comments: pt with mental distability at baseline, able to follow 1 step commands and oriented to person, place; decreased safety awareness throughout session       Exercises      General Comments        Pertinent Vitals/Pain Pain Assessment: No/denies pain    Home Living                      Prior Function            PT Goals (current goals can now be found  in the care plan section) Acute Rehab PT Goals Patient Stated Goal: to get into chair PT Goal Formulation: With patient Time For Goal Achievement: 05/02/18 Potential to Achieve Goals: Fair    Frequency    Min 2X/week      PT Plan      Co-evaluation              AM-PAC PT "6 Clicks" Mobility   Outcome Measure  Help needed turning from your back to your side while in a flat bed without using bedrails?: None Help needed moving from lying on your back to sitting on the side of a flat bed without using bedrails?: A Little Help needed moving to and from a bed to a chair (including a wheelchair)?: A Little Help needed standing up from a chair using your arms (e.g., wheelchair or bedside chair)?: A  Little Help needed to walk in hospital room?: A Lot Help needed climbing 3-5 steps with a railing? : A Lot 6 Click Score: 17    End of Session Equipment Utilized During Treatment: Gait belt Activity Tolerance: Patient tolerated treatment well Patient left: in bed;with call bell/phone within reach;with bed alarm set Nurse Communication: Mobility status PT Visit Diagnosis: History of falling (Z91.81);Muscle weakness (generalized) (M62.81);Other abnormalities of gait and mobility (R26.89)     Time: 0102-7253 PT Time Calculation (min) (ACUTE ONLY): 30 min  Charges:  $Gait Training: 8-22 mins $Therapeutic Activity: 8-22 mins                     Margarita Mail, SPTA   Margarita Mail 04/20/2018, 4:49 PM

## 2018-04-20 NOTE — Progress Notes (Addendum)
[facility] PROGRESS NOTE  Nicholas Hardin QVZ:[id number] DOB: Jun 27, 1945 DOA: 04/17/2018  PCP: Sherren Mocha, MD  Brief History/Interval Summary: 73 y.o.malewith medical history significant ofdisability, diabetes, hypertension brought in by family members late evening 04/17/18 because of confusion that was uncharacteristic for his baseline. He was found to have cellulitis of his right lower extremity.  Reason for Visit: Right lower extremity cellulitis.  Acute metabolic encephalopathy.  Consultants: None  Procedures: None  Antibiotics: Clindamycin  Subjective/Interval History: Patient somewhat lethargic.  As per nursing staff he was awake all night and went to sleep this morning.  ROS: Unable to do at this time due to his encephalopathy  Objective:  Vital Signs  Vitals:   04/19/18 2350 04/20/18 0331 04/20/18 0745 04/20/18 1138  BP: 125/70 136/82 135/67 113/74  Pulse: 91 66 (!) 101 97  Resp: 16 16 16 14   Temp: 98.2 F (36.8 C) 98 F (36.7 C) (!) 97.5 F (36.4 C) 98.6 F (37 C)  TempSrc: Oral Oral Oral Oral  SpO2: 96% 100% 100% 100%    Intake/Output Summary (Last 24 hours) at 04/20/2018 1218 Last data filed at 04/20/2018 0734 Gross per 24 hour  Intake 460 ml  Output 550 ml  Net -90 ml   There were no vitals filed for this visit.  General appearance: Somnolent.  Arousable.  No distress. Resp: Clear to auscultation bilaterally.  Normal effort Cardio: S1-S2 is normal regular.  No S3-S4.  No rubs murmurs or bruit GI: Abdomen is soft.  Nontender nondistended.  Bowel sounds are present normal.  No masses organomegaly Extremities: Mild erythema noted over the right leg anteriorly.  No fluctuance noted.  No significant swelling. Neurologic: Noted to be moving all his extremities.   Lab Results:  Data Reviewed: I have personally reviewed following labs and imaging studies  CBC: Recent Labs  Lab 04/17/18 1653 04/18/18 0423 04/19/18 0413  WBC 12.3* 10.3  10.5  NEUTROABS 10.0*  --   --   HGB 11.1* 10.7* 11.3*  HCT 35.4* 33.1* 34.7*  MCV 88.9 86.4 86.1  PLT 278 262 295    Basic Metabolic Panel: Recent Labs  Lab 04/17/18 1653 04/18/18 0423 04/19/18 0413 04/20/18 0440  NA 140 140 142 137  K 3.6 3.4* 3.5 3.2*  CL 105 105 106 105  CO2 23 26 27  20*  GLUCOSE 173* 128* 170* 242*  BUN 13 10 7* 10  CREATININE 0.80 0.89 0.90 0.97  CALCIUM 9.2 9.1 9.4 8.6*  MG  --   --  2.0  --     GFR: CrCl cannot be calculated (Unknown ideal weight.).  Liver Function Tests: Recent Labs  Lab 04/17/18 1653  AST 18  ALT 13  ALKPHOS 64  BILITOT 0.8  PROT 6.4*  ALBUMIN 3.6     Recent Labs  Lab 04/17/18 1653  AMMONIA 15     HbA1C: Recent Labs    04/18/18 0423  HGBA1C 6.8*    CBG: Recent Labs  Lab 04/19/18 1119 04/19/18 1610 04/19/18 2110 04/20/18 0622  GLUCAP 188* 174* 260* 238*      Recent Results (from the past 240 hour(s))  Gram stain     Status: None   Collection Time: 04/17/18  4:53 PM  Result Value Ref Range Status   Specimen Description URINE, CLEAN CATCH  Final   Special Requests NONE  Final   Gram Stain   Final    WBC PRESENT,BOTH PMN AND MONONUCLEAR GRAM POSITIVE COCCI GRAM VARIABLE ROD  Gram Stain Report Called to,Read Back By and Verified With: Higinio Roger RN [phone] 04/17/18 A BROWNING Performed at [facility], [address], [place], [place] [address]    Report Status 04/17/2018 FINAL  Final      Radiology Studies: No results found.   Medications:  Scheduled: . atorvastatin  10 mg Oral Daily  . clindamycin  300 mg Oral Q8H  . diltiazem  180 mg Oral Daily  . insulin aspart  0-9 Units Subcutaneous TID WC  . losartan  12.5 mg Oral Daily  . metoprolol tartrate  25 mg Oral TID  . multivitamin with minerals  1 tablet Oral Daily  . rivaroxaban  20 mg Oral Q supper  . senna-docusate  4 tablet Oral QHS  . sodium chloride flush  3 mL Intravenous Q12H   Continuous: . sodium chloride    .  potassium chloride 10 mEq (04/20/18 1206)   LKH:VFMBBU chloride, acetaminophen **OR** acetaminophen, ondansetron **OR** ondansetron (ZOFRAN) IV, sodium chloride flush    Assessment/Plan:  Acute metabolic encephalopathy Most likely triggered by infection.  CT scan of the head did not show any acute findings.  Patient is very somnolent this morning.  Apparently he did not sleep last night.  Continue to monitor for now.  Right lower extremity cellulitis Patient was placed on clindamycin.  Appears to have some improvement.  Changed to oral clindamycin.  We will add probiotics.  Vomiting x1 Patient had one episode of emesis this morning.  No blood was noted.  His abdomen is benign.  Continue to monitor for now.  Antiemetics as needed.  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  Holding metformin.  HbA1c 6.8.  SSI.  History of mental disability Baseline is unknown.  Patient apparently was living with his cousin.  History of essential hypertension Continue home medications.  Monitor blood pressures closely.  Chronic atrial fibrillation, rate controlled Noted to be on rivaroxaban which will be continued.  Hypokalemia Potassium level noted to be 3.2 today.  This will be repleted.  Magnesium was normal yesterday.   DVT Prophylaxis: On rivaroxaban    Code Status: Full code Family Communication: No family at bedside Disposition Plan: Patient will need to go to skilled nursing facility for rehab.  Not medically ready yet .    LOS: 1 day   Leilah Polimeni [employer] on [email or web address]  04/20/2018, 12:18 PM

## 2018-04-20 NOTE — Progress Notes (Signed)
Inpatient Diabetes Program Recommendations  AACE/ADA: New Consensus Statement on Inpatient Glycemic Control (2015)  Target Ranges:  Prepandial:   less than 140 mg/dL      Peak postprandial:   less than 180 mg/dL (1-2 hours)      Critically ill patients:  140 - 180 mg/dL   Lab Results  Component Value Date   GLUCAP 238 (H) 04/20/2018   HGBA1C 6.8 (H) 04/18/2018   Results for Nicholas Hardin, Nicholas "[name]" (MRN [id number]) as of 04/20/2018 09:17  Ref. Range 04/19/2018 11:19 04/19/2018 16:10 04/19/2018 21:10 04/20/2018 06:22  Glucose-Capillary Latest Ref Range: 70 - 99 mg/dL 940 (H) 768 (H) 088 (H) 238 (H)   Review of Glycemic Control  Diabetes history: Type 2 Outpatient Diabetes medications: Metformin Current orders for Inpatient glycemic control: Novolog SENSITIVE correction scale TID & HS  Inpatient Diabetes Program Recommendations:   Noted that CBGs have been greater than 180 mg/dl last night and this am. Recommend increasing Novolog correction scale to MODERATE TID & HS if blood sugars continue to be elevated. Will continue to monitor blood sugars while in the hospital.  Smith Mince RN BSN CDE Diabetes Coordinator Pager: [phone]  8am-5pm

## 2018-04-20 NOTE — Progress Notes (Signed)
Pt found with undigested vomit laying in bed. Pt has not eaten since dinner last night. Vital signs stable. Pt complaining of small stomach ache. Dr. Rito Ehrlich notified. Nurse will continue to monitor. Tyron Russell Damoney Julia

## 2018-04-21 DIAGNOSIS — E876 Hypokalemia: Secondary | ICD-10-CM

## 2018-04-21 LAB — BASIC METABOLIC PANEL
ANION GAP: 7 (ref 5–15)
Anion gap: 7 (ref 5–15)
BUN: 11 mg/dL (ref 8–23)
BUN: 10 mg/dL (ref 8–23)
CO2: 25 mmol/L (ref 22–32)
CO2: 28 mmol/L (ref 22–32)
Calcium: 8.7 mg/dL — ABNORMAL LOW (ref 8.9–10.3)
Calcium: 9 mg/dL (ref 8.9–10.3)
Chloride: 106 mmol/L (ref 98–111)
Chloride: 105 mmol/L (ref 98–111)
Creatinine, Ser: 0.89 mg/dL (ref 0.61–1.24)
Creatinine, Ser: 0.98 mg/dL (ref 0.61–1.24)
GFR calc Af Amer: >60 mL/min (ref >60)
GFR calc Af Amer: >60 mL/min (ref >60)
GFR calc non Af Amer: >60 mL/min (ref >60)
GFR calc non Af Amer: >60 mL/min (ref >60)
Glucose, Bld: 196 mg/dL — ABNORMAL HIGH (ref 70–99)
Glucose, Bld: 203 mg/dL — ABNORMAL HIGH (ref 70–99)
POTASSIUM: 4 mmol/L (ref 3.5–5.1)
Potassium: 2.9 mmol/L — ABNORMAL LOW (ref 3.5–5.1)
Sodium: 138 mmol/L (ref 135–145)
Sodium: 140 mmol/L (ref 135–145)

## 2018-04-21 LAB — GLUCOSE, CAPILLARY
GLUCOSE-CAPILLARY: 214 mg/dL — AB (ref 70–99)
GLUCOSE-CAPILLARY: 375 mg/dL — AB (ref 70–99)
Glucose-Capillary: 192 mg/dL — ABNORMAL HIGH (ref 70–99)
Glucose-Capillary: 190 mg/dL — ABNORMAL HIGH (ref 70–99)

## 2018-04-21 LAB — CBC
HCT: 33.5 % — ABNORMAL LOW (ref 39.0–52.0)
Hemoglobin: 10.9 g/dL — ABNORMAL LOW (ref 13.0–17.0)
MCH: 28.1 pg (ref 26.0–34.0)
MCHC: 32.5 g/dL (ref 30.0–36.0)
MCV: 86.3 fL (ref 80.0–100.0)
NRBC: 0 % (ref 0.0–0.2)
Platelets: 278 10*3/uL (ref 150–400)
RBC: 3.88 MIL/uL — ABNORMAL LOW (ref 4.22–5.81)
RDW: 13.2 % (ref 11.5–15.5)
WBC: 11.7 10*3/uL — ABNORMAL HIGH (ref 4.0–10.5)

## 2018-04-21 LAB — MAGNESIUM: Magnesium: 2 mg/dL (ref 1.7–2.4)

## 2018-04-21 MED ORDER — CLINDAMYCIN HCL 300 MG PO CAPS: 300.0000 mg | ORAL_CAPSULE | Freq: Three times a day (TID) | ORAL | Status: AC

## 2018-04-21 MED ORDER — INSULIN GLARGINE 100 UNIT/ML ~~LOC~~ SOLN
9.0000 [IU] | Freq: Every day | SUBCUTANEOUS | Status: DC
  Administered 2018-04-21 – 2018-04-22 (×2): 9 [IU] via SUBCUTANEOUS
  Filled 2018-04-21 (×2): qty 0.09

## 2018-04-21 MED ORDER — SACCHAROMYCES BOULARDII 250 MG PO CAPS: 250.0000 mg | ORAL_CAPSULE | Freq: Two times a day (BID) | ORAL | Status: AC

## 2018-04-21 MED ORDER — INSULIN ASPART 100 UNIT/ML ~~LOC~~ SOLN
6.0000 [IU] | Freq: Once | SUBCUTANEOUS | Status: AC
  Administered 2018-04-21: 6 [IU] via SUBCUTANEOUS

## 2018-04-21 MED ORDER — POTASSIUM CHLORIDE CRYS ER 20 MEQ PO TBCR
40.0000 meq | EXTENDED_RELEASE_TABLET | ORAL | Status: AC
  Administered 2018-04-21 (×2): 40 meq via ORAL
  Filled 2018-04-21 (×2): qty 2

## 2018-04-21 NOTE — Progress Notes (Signed)
Occupational Therapy Treatment Patient Details Name: Nicholas Hardin MRN: [id number] DOB: June 01, 1945 Today's Date: 04/21/2018    History of present illness Mr. Haselhuhn is a 73 yo male who presented to the ED after an episode of altered mental status at home; family reports suspected fall. PMH: mental disability, DM, HTN, A-fib, cellulitis.   OT comments  Pt progressing towards acute OT goals. Eager to walk in the halls and sit up in the recliner. Reports walking is something he enjoys. Assist needed for LB ADLs and occasional steadying assist with walking. D/c plan remains appropriate.    Follow Up Recommendations  SNF    Equipment Recommendations  Other (comment)(TBD at next venue of care)    Recommendations for Other Services      Precautions / Restrictions Precautions Precautions: Fall Restrictions Weight Bearing Restrictions: No       Mobility Bed Mobility Overal bed mobility: Needs Assistance Bed Mobility: Supine to Sit     Supine to sit: Min guard;HOB elevated     General bed mobility comments: min guard for safety   Transfers Overall transfer level: Needs assistance Equipment used: Rolling walker (2 wheeled) Transfers: Sit to/from Stand Sit to Stand: Min guard;Min assist         General transfer comment: min assist for safety and balance with cues for hand placement.     Balance Overall balance assessment: Needs assistance;History of Falls Sitting-balance support: Feet supported;No upper extremity supported Sitting balance-Leahy Scale: Fair     Standing balance support: No upper extremity supported;During functional activity Standing balance-Leahy Scale: Poor Standing balance comment: reliant on external support                            ADL either performed or assessed with clinical judgement   ADL Overall ADL's : Needs assistance/impaired             Lower Body Bathing: Moderate assistance;Sit to/from stand           Toilet  Transfer: Minimal assistance;Ambulation;RW           Functional mobility during ADLs: Min guard;Minimal assistance;Rolling walker General ADL Comments: Pt completed LB bathing tasks as detailed above. Pt eager to walk in the halls. Walked 2 laps around nurses station (community distance) at decreased speed and extra effort, mostly min guard occasional min A on sharp turns.     Vision       Perception     Praxis      Cognition Arousal/Alertness: Awake/alert Behavior During Therapy: WFL for tasks assessed/performed Overall Cognitive Status: History of cognitive impairments - at baseline                                          Exercises     Shoulder Instructions       General Comments      Pertinent Vitals/ Pain       Pain Assessment: No/denies pain  Home Living                                          Prior Functioning/Environment              Frequency  Min 2X/week        Progress  Toward Goals  OT Goals(current goals can now be found in the care plan section)  Progress towards OT goals: Progressing toward goals  Acute Rehab OT Goals Patient Stated Goal: to walk and sit up in chair Time For Goal Achievement: 05/03/18 Potential to Achieve Goals: Good ADL Goals Pt Will Perform Grooming: standing;with supervision Pt Will Perform Upper Body Bathing: with supervision;sitting Pt Will Perform Lower Body Dressing: sit to/from stand;with min assist Pt Will Transfer to Toilet: with supervision;ambulating;bedside commode  Plan Discharge plan remains appropriate    Co-evaluation                 AM-PAC OT "6 Clicks" Daily Activity     Outcome Measure   Help from another person eating meals?: A Little Help from another person taking care of personal grooming?: A Little Help from another person toileting, which includes using toliet, bedpan, or urinal?: Total Help from another person bathing (including washing,  rinsing, drying)?: A Lot Help from another person to put on and taking off regular upper body clothing?: A Little Help from another person to put on and taking off regular lower body clothing?: A Lot 6 Click Score: 14    End of Session Equipment Utilized During Treatment: Rolling walker  OT Visit Diagnosis: Other abnormalities of gait and mobility (R26.89);Other symptoms and signs involving cognitive function;Muscle weakness (generalized) (M62.81)   Activity Tolerance Patient tolerated treatment well   Patient Left in chair;with call bell/phone within reach;with chair alarm set(with Lab Tech)   Nurse Communication          Time: 1771-1657 OT Time Calculation (min): 22 min  Charges: OT General Charges $OT Visit: 1 Visit OT Treatments $Self Care/Home Management : 8-22 mins  Raynald Kemp, OT Acute Rehabilitation Services Pager: [phone] Office: [phone]    Pilar Grammes 04/21/2018, 1:49 PM

## 2018-04-21 NOTE — Care Management Important Message (Signed)
Important Message  Patient Details  Name: Nicholas Hardin MRN: [id number] Date of Birth: 11-05-1945   Medicare Important Message Given:  Yes    Dorena Bodo 04/21/2018, 3:29 PM

## 2018-04-21 NOTE — Progress Notes (Signed)
Inpatient Diabetes Program Recommendations  AACE/ADA: New Consensus Statement on Inpatient Glycemic Control (2015)  Target Ranges:  Prepandial:   less than 140 mg/dL      Peak postprandial:   less than 180 mg/dL (1-2 hours)      Critically ill patients:  140 - 180 mg/dL   Results for PEJA, HARTTER" (MRN [id number]) as of 04/21/2018 10:02  Ref. Range 04/20/2018 06:22 04/20/2018 12:41 04/20/2018 17:04 04/20/2018 21:23  Glucose-Capillary Latest Ref Range: 70 - 99 mg/dL 382 (H)  3 units NOVOLOG  195 (H)  2 units NOVOLOG  164 (H)  2 units NOVOLOG  204 (H)   Results for LAKYN, AVE" (MRN [id number]) as of 04/21/2018 10:02  Ref. Range 04/21/2018 06:30  Glucose-Capillary Latest Ref Range: 70 - 99 mg/dL 419 (H)  3 units NOVOLOG     Home DM Meds: Metformin 1000 mg BID  Current Orders: Novolog Sensitive Correction Scale/ SSI (0-9 units) TID AC      MD- Please consider adding low dose basal insulin to pt's inpatient regimen while home oral DM meds are on hold:  Lantus 9 units Daily (0.1 units/kg based on weight of 91 kg)     --Will follow patient during hospitalization--  Ambrose Finland RN, MSN, CDE Diabetes Coordinator Inpatient Glycemic Control Team Team Pager: [phone] (8a-5p)

## 2018-04-21 NOTE — Discharge Summary (Signed)
[facility]  Physician Discharge Summary   Patient ID: Nicholas Hardin MRN: [id number] DOB/AGE: 71-Aug-1947 71 y.o.  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 Please check CBC and basic metabolic panel in 4 to 5 days    Home Health:NA  Equipment/Devices:NA   CODE STATUS: Full code  DISCHARGE CONDITION: fair  Diet recommendation: Modified carbohydrate  INITIAL HISTORY: 73 y.o.malewith medical history significant ofdisability, diabetes, hypertension brought in by family memberslate evening 3/16/20because of confusion that was uncharacteristic for his baseline. He was found to have cellulitis of his right lower extremity.    HOSPITAL COURSE:   Acute metabolic encephalopathy Most likely triggered by infection.  CT scan of the head did not show any acute findings.  Patient has improved.  Seems to be close to baseline though still somewhat deconditioned.    Right lower extremity cellulitis Patient was placed on clindamycin.  Edema has improved.  Changed over to oral clindamycin.  Continue probiotics.  Continue for 4 more days.   Vomiting x1 Patient had one episode of emesis on 3/19.  No further episodes noted.  Abdomen remains benign.    Diabetes mellitus type 2 with peripheral circulatory disorder HbA1c 6.8.  May continue metformin.  History of mental disability Patient apparently was living with his cousin.  However he is by himself for most part of the day while the cousin is at work.  History of essential hypertension Continue home medications.    Chronic atrial fibrillation, rate controlled Continue rivaroxaban.  Continue  diltiazem.  Hypokalemia Potassium has been corrected.  Magnesium is normal.  Patient seen by physical therapy and Occupational Therapy and skilled nursing facility is recommended for short-term rehab.  Patient is medically stable for discharge to skilled nursing facility at this time.   PERTINENT LABS:  The results of significant diagnostics from this hospitalization (including imaging, microbiology, ancillary and laboratory) are listed below for reference.    Microbiology: Recent Results (from the past 240 hour(s))  Gram stain     Status: None   Collection Time: 04/17/18  4:53 PM  Result Value Ref Range Status   Specimen Description URINE, CLEAN CATCH  Final   Special Requests NONE  Final   Gram Stain   Final    WBC PRESENT,BOTH PMN AND MONONUCLEAR GRAM POSITIVE COCCI GRAM VARIABLE ROD Gram Stain Report Called to,Read Back By and Verified With: Higinio Roger RN [phone] 04/17/18 A BROWNING Performed at [facility], [address], [place], [place] [address]    Report Status 04/17/2018 FINAL  Final     Labs: Basic Metabolic Panel: Recent Labs  Lab 04/19/18 0413 04/20/18 0440 04/21/18 0312 04/21/18 1339 04/22/18 0327  NA 142 137 138 140 138  K 3.5 3.2* 2.9* 4.0 3.7  CL 106 105 106 105 106  CO2 27 20* GLUCOSE 170* 242* 196* 203* 189*  BUN 7* CREATININE 0.90 0.97 0.89 0.98 0.77  CALCIUM 9.4 8.6* 8.7* 9.0 8.8*  MG 2.0  --  2.0  --   --    Liver Function Tests: Recent Labs  Lab 04/17/18 1653  AST 18  ALT 13  ALKPHOS 64  BILITOT 0.8  PROT 6.4*  ALBUMIN 3.6  Recent Labs  Lab 04/17/18 1653  AMMONIA 15   CBC: Recent Labs  Lab 04/17/18 1653 04/18/18 0423 04/19/18 0413 04/21/18 0312  WBC 12.3* 10.3 10.5 11.7*  NEUTROABS 10.0*  --   --   --   HGB 11.1* 10.7* 11.3* 10.9*  HCT 35.4* 33.1* 34.7* 33.5*  MCV 88.9 86.4 86.1 86.3  PLT 278 262 295 278    CBG: Recent Labs  Lab 04/21/18 1149 04/21/18 1742 04/21/18 2120  04/22/18 0607 04/22/18 1120  GLUCAP 192* 190* 375* 257* 157*     IMAGING STUDIES Ct Head Wo Contrast  Result Date: 04/17/2018 CLINICAL DATA:  Altered mental status, repetitive questioning EXAM: CT HEAD WITHOUT CONTRAST TECHNIQUE: Contiguous axial images were obtained from the base of the skull through the vertex without intravenous contrast. COMPARISON:  02/02/2013 FINDINGS: Brain: No evidence of acute infarction, hemorrhage, extra-axial collection, ventriculomegaly, or mass effect. Generalized cerebral atrophy. Periventricular white matter low attenuation likely secondary to microangiopathy. Vascular: Cerebrovascular atherosclerotic calcifications are noted. Skull: Negative for fracture or focal lesion. Sinuses/Orbits: Visualized portions of the orbits are unremarkable. Visualized portions of the paranasal sinuses and mastoid air cells are unremarkable. Other: None. IMPRESSION: 1. No acute intracranial pathology. 2. Chronic microvascular disease and cerebral atrophy. Electronically Signed   By: Elige Ko   On: 04/17/2018 18:22   Dg Chest Portable 1 View  Result Date: 04/17/2018 CLINICAL DATA:  Altered mental status. Dizziness. Headache. Fever. EXAM: PORTABLE CHEST 1 VIEW COMPARISON:  Radiographs 01/10/2014 and 08/30/2012. FINDINGS: 1645 hours. There is stable cardiomegaly and aortic atherosclerosis. The lungs are clear. There is no pleural effusion or pneumothorax. No acute osseous findings are evident. Telemetry leads overlie the chest. IMPRESSION: No active cardiopulmonary process.  Cardiomegaly. Electronically Signed   By: Carey Bullocks M.D.   On: 04/17/2018 17:16    DISCHARGE EXAMINATION: Vitals:   04/21/18 2355 04/22/18 0352 04/22/18 0828 04/22/18 1118  BP: 116/73 121/76 118/61 125/74  Pulse: 88 82 86 83  Resp: 18 18 16 16   Temp: 97.6 F (36.4 C) 97.7 F (36.5 C) 97.7 F (36.5 C) (!) 97.4 F (36.3 C)  TempSrc: Oral Oral Oral Oral  SpO2: 96% 97% 100% 97%   General appearance:  Awake alert.  In no distress Resp: Clear to auscultation bilaterally.  Normal effort Cardio: S1-S2 is normal regular.  No S3-S4.  No rubs murmurs or bruit GI: Abdomen is soft.  Nontender nondistended.  Bowel sounds are present normal.  No masses organomegaly Extremities: No edema.  Full range of motion of lower extremities.  Improved erythema over the right lower extremity.    DISPOSITION: SNF  Discharge Instructions    Call MD for:  difficulty breathing, headache or visual disturbances   Complete by:  As directed    Call MD for:  extreme fatigue   Complete by:  As directed    Call MD for:  persistant dizziness or light-headedness   Complete by:  As directed    Call MD for:  persistant nausea and vomiting   Complete by:  As directed    Call MD for:  severe uncontrolled pain   Complete by:  As directed    Call MD for:  temperature >100.4   Complete by:  As directed    Discharge instructions   Complete by:  As directed    Please review instructions on the discharge summary.  You were cared for by a hospitalist during your hospital stay. If you have any questions about your discharge  medications or the care you received while you were in the hospital after you are discharged, you can call the unit and asked to speak with the hospitalist on call if the hospitalist that took care of you is not available. Once you are discharged, your primary care physician will handle any further medical issues. Please note that NO REFILLS for any discharge medications will be authorized once you are discharged, as it is imperative that you return to your primary care physician (or establish a relationship with a primary care physician if you do not have one) for your aftercare needs so that they can reassess your need for medications and monitor your lab values. If you do not have a primary care physician, you can call [phone] for a physician referral.   Increase activity slowly   Complete by:  As directed          Allergies as of 04/22/2018      Reactions   Sulfa Antibiotics Hives      Medication List    TAKE these medications   atorvastatin 10 MG tablet Commonly known as:  LIPITOR Take 10 mg by mouth daily.   clindamycin 300 MG capsule Commonly known as:  CLEOCIN Take 1 capsule (300 mg total) by mouth every 8 (eight) hours for 4 days.   diltiazem 180 MG 24 hr capsule Commonly known as:  TIAZAC TAKE 1 CAPSULE BY MOUTH EVERY DAY   losartan 25 MG tablet Commonly known as:  COZAAR TAKE 1/2 TABLET BY MOUTH ONCE DAILY   metFORMIN 1000 MG tablet Commonly known as:  GLUCOPHAGE Take 1 tablet (1,000 mg total) by mouth 2 (two) times daily with a meal.   metoprolol tartrate 25 MG tablet Commonly known as:  LOPRESSOR Take 0.5 tablets (12.5 mg total) by mouth 2 (two) times daily. What changed:    how much to take  when to take this   multivitamin tablet Take 1 tablet by mouth daily.   rivaroxaban 20 MG Tabs tablet Commonly known as:  XARELTO Take 1 tablet (20 mg total) by mouth daily with supper.   saccharomyces boulardii 250 MG capsule Commonly known as:  FLORASTOR Take 1 capsule (250 mg total) by mouth 2 (two) times daily.   senna-docusate 8.6-50 MG tablet Commonly known as:  Senokot-S Take 4 tablets by mouth at bedtime.         Contact information for follow-up providers    Sherren Mocha, MD. Schedule an appointment as soon as possible for a visit in 2 week(s).   Specialty:  Family Medicine Contact information: [address] [place] [address] [phone]            Contact information for after-discharge care    Destination    [facility]erred [facility] .   Service:  Skilled Nursing Contact information: [address] [place] [address] [phone]                  TOTAL DISCHARGE TIME: 35 minutes  Yoseline Andersson Rito Ehrlich  [facility] Pager on [email or web address]  04/22/2018, 11:31 AM

## 2018-04-21 NOTE — Progress Notes (Signed)
[facility] PROGRESS NOTE  Nicholas Hardin:[id number] DOB: 1945-03-11 DOA: 04/17/2018  PCP: Sherren Mocha, MD  Brief History/Interval Summary: 73 y.o.malewith medical history significant ofdisability, diabetes, hypertension brought in by family members late evening 04/17/18 because of confusion that was uncharacteristic for his baseline. He was found to have cellulitis of his right lower extremity.  Reason for Visit: Right lower extremity cellulitis.  Acute metabolic encephalopathy.  Consultants: None  Procedures: None  Antibiotics: Clindamycin  Subjective/Interval History: Patient noted to be awake alert this morning.  Answering questions.  According to nursing staff he slept well overnight.  Patient denies any nausea or vomiting.  Tolerating his diet.   Objective:  Vital Signs  Vitals:   04/20/18 2012 04/20/18 2347 04/21/18 0435 04/21/18 0746  BP: 128/77 138/77 105/61 124/72  Pulse: 96 91 88 88  Resp: 18 16 16 16   Temp: 97.6 F (36.4 C) (!) 97.5 F (36.4 C) 98 F (36.7 C) 98.5 F (36.9 C)  TempSrc: Oral Oral Oral Oral  SpO2: 97% 100% 97% 99%    Intake/Output Summary (Last 24 hours) at 04/21/2018 1140 Last data filed at 04/21/2018 0811 Gross per 24 hour  Intake 680 ml  Output 1300 ml  Net -620 ml   There were no vitals filed for this visit.  General appearance: Awake alert.  In no distress Resp: Clear to auscultation bilaterally.  Normal effort Cardio: S1-S2 is normal regular.  No S3-S4.  No rubs murmurs or bruit GI: Abdomen is soft.  Nontender nondistended.  Bowel sounds are present normal.  No masses organomegaly Extremities: No edema.  Full range of motion of lower extremities. Neurologic: Alert and oriented x3.  No focal neurological deficits.     Lab Results:  Data Reviewed: I have personally reviewed following labs and imaging studies  CBC: Recent Labs  Lab 04/17/18 1653 04/18/18 0423 04/19/18 0413 04/21/18 0312  WBC 12.3* 10.3 10.5  11.7*  NEUTROABS 10.0*  --   --   --   HGB 11.1* 10.7* 11.3* 10.9*  HCT 35.4* 33.1* 34.7* 33.5*  MCV 88.9 86.4 86.1 86.3  PLT 278 262 295 278    Basic Metabolic Panel: Recent Labs  Lab 04/17/18 1653 04/18/18 0423 04/19/18 0413 04/20/18 0440 04/21/18 0312  NA 140 140 142 137 138  K 3.6 3.4* 3.5 3.2* 2.9*  CL 105 105 106 105 106  CO2 23 26 27  20* 25  GLUCOSE 173* 128* 170* 242* 196*  BUN 13 10 7* 10 11  CREATININE 0.80 0.89 0.90 0.97 0.89  CALCIUM 9.2 9.1 9.4 8.6* 8.7*  MG  --   --  2.0  --  2.0    GFR: CrCl cannot be calculated (Unknown ideal weight.).  Liver Function Tests: Recent Labs  Lab 04/17/18 1653  AST 18  ALT 13  ALKPHOS 64  BILITOT 0.8  PROT 6.4*  ALBUMIN 3.6     Recent Labs  Lab 04/17/18 1653  AMMONIA 15     HbA1C: No results for input(s): HGBA1C in the last 72 hours.  CBG: Recent Labs  Lab 04/20/18 0622 04/20/18 1241 04/20/18 1704 04/20/18 2123 04/21/18 0630  GLUCAP 238* 195* 164* 204* 214*      Recent Results (from the past 240 hour(s))  Gram stain     Status: None   Collection Time: 04/17/18  4:53 PM  Result Value Ref Range Status   Specimen Description URINE, CLEAN CATCH  Final   Special Requests NONE  Final  Gram Stain   Final    WBC PRESENT,BOTH PMN AND MONONUCLEAR GRAM POSITIVE COCCI GRAM VARIABLE ROD Gram Stain Report Called to,Read Back By and Verified With: Higinio Roger RN [phone] 04/17/18 A BROWNING Performed at [facility], [address] [place], [place] [address]    Report Status 04/17/2018 FINAL  Final      Radiology Studies: No results found.   Medications:  Scheduled: . atorvastatin  10 mg Oral Daily  . clindamycin  300 mg Oral Q8H  . diltiazem  180 mg Oral Daily  . insulin aspart  0-9 Units Subcutaneous TID WC  . losartan  12.5 mg Oral Daily  . metoprolol tartrate  25 mg Oral TID  . multivitamin with minerals  1 tablet Oral Daily  . rivaroxaban  20 mg Oral Q supper  . saccharomyces boulardii   250 mg Oral BID  . senna-docusate  4 tablet Oral QHS  . sodium chloride flush  3 mL Intravenous Q12H   Continuous: . sodium chloride     VOH:YWVPXT chloride, acetaminophen **OR** acetaminophen, ondansetron **OR** ondansetron (ZOFRAN) IV, sodium chloride flush    Assessment/Plan:  Acute metabolic encephalopathy Most likely triggered by infection.  CT scan of the head did not show any acute findings.  Patient has improved.  Seems to be close to baseline though still somewhat deconditioned.    Right lower extremity cellulitis Patient was placed on clindamycin.  Edema has improved.  Changed over to oral clindamycin.  Continue probiotics.    Vomiting x1 Patient had one episode of emesis on 3/19.  No further episodes noted.  Abdomen remains benign.    Diabetes mellitus type 2 with peripheral circulatory disorder Metformin is on hold.  CBGs noted to be between 150-250.  HbA1c 6.8.  SSI.  History of mental disability Patient apparently was living with his cousin.  However he is by himself for most part of the day while the cousin is at work.  History of essential hypertension Continue home medications.  Monitor blood pressures closely.  Very well controlled.  Chronic atrial fibrillation, rate controlled Noted to be on rivaroxaban which will be continued.  Hypokalemia Potassium noted to be 2.9.  Will be aggressively repleted.  Magnesium 2.0.  Recheck labs later today.  PT/OT evaluations performed. SNF recommended. SNF appropriate as the patient has received at least 3 days of hospital care and is felt to need rehab services to restore this patient to their prior level of function to achieve safe transition back to home care.  Patient with intellectual disability.  At baseline he is usually able to stay by himself during the day hours.  However he is noted to be deconditioned due to his acute illness and would be in an unsafe environment if sent home without appropriate rehab.  This  patient needs rehab services for at least 5 days per week and skilled nursing services daily to facilitate this transition. Rehab is being requested as the most appropriate d/c option for this patient and is NOT felt to be for custodial care as evidenced by previous level of functioning despite his intellectual disability.   DVT Prophylaxis: On rivaroxaban    Code Status: Full code Family Communication: No family at bedside Disposition Plan: Patient will need to go to skilled nursing facility for rehab.  Replace potassium.  Recheck labs later today.    LOS: 2 days   Zymeir Salminen Rito Ehrlich  [facility] Pager on [email or web address]  04/21/2018, 11:40 AM

## 2018-04-22 DIAGNOSIS — E876 Hypokalemia: Secondary | ICD-10-CM | POA: Diagnosis not present

## 2018-04-22 DIAGNOSIS — L03119 Cellulitis of unspecified part of limb: Secondary | ICD-10-CM | POA: Diagnosis not present

## 2018-04-22 DIAGNOSIS — M2550 Pain in unspecified joint: Secondary | ICD-10-CM | POA: Diagnosis not present

## 2018-04-22 DIAGNOSIS — M6281 Muscle weakness (generalized): Secondary | ICD-10-CM | POA: Diagnosis not present

## 2018-04-22 DIAGNOSIS — G9341 Metabolic encephalopathy: Secondary | ICD-10-CM | POA: Diagnosis not present

## 2018-04-22 DIAGNOSIS — E1159 Type 2 diabetes mellitus with other circulatory complications: Secondary | ICD-10-CM | POA: Diagnosis not present

## 2018-04-22 DIAGNOSIS — Z79899 Other long term (current) drug therapy: Secondary | ICD-10-CM | POA: Diagnosis not present

## 2018-04-22 DIAGNOSIS — L0390 Cellulitis, unspecified: Secondary | ICD-10-CM | POA: Diagnosis not present

## 2018-04-22 DIAGNOSIS — R1312 Dysphagia, oropharyngeal phase: Secondary | ICD-10-CM | POA: Diagnosis not present

## 2018-04-22 DIAGNOSIS — I10 Essential (primary) hypertension: Secondary | ICD-10-CM | POA: Diagnosis not present

## 2018-04-22 DIAGNOSIS — E1143 Type 2 diabetes mellitus with diabetic autonomic (poly)neuropathy: Secondary | ICD-10-CM | POA: Diagnosis not present

## 2018-04-22 DIAGNOSIS — I499 Cardiac arrhythmia, unspecified: Secondary | ICD-10-CM | POA: Diagnosis not present

## 2018-04-22 DIAGNOSIS — L03115 Cellulitis of right lower limb: Secondary | ICD-10-CM | POA: Diagnosis not present

## 2018-04-22 DIAGNOSIS — R2681 Unsteadiness on feet: Secondary | ICD-10-CM | POA: Diagnosis not present

## 2018-04-22 DIAGNOSIS — R2689 Other abnormalities of gait and mobility: Secondary | ICD-10-CM | POA: Diagnosis not present

## 2018-04-22 DIAGNOSIS — Z7401 Bed confinement status: Secondary | ICD-10-CM | POA: Diagnosis not present

## 2018-04-22 LAB — BASIC METABOLIC PANEL
ANION GAP: 8 (ref 5–15)
BUN: 11 mg/dL (ref 8–23)
CO2: 24 mmol/L (ref 22–32)
Calcium: 8.8 mg/dL — ABNORMAL LOW (ref 8.9–10.3)
Chloride: 106 mmol/L (ref 98–111)
Creatinine, Ser: 0.77 mg/dL (ref 0.61–1.24)
GFR calc Af Amer: >60 mL/min (ref >60)
Glucose, Bld: 189 mg/dL — ABNORMAL HIGH (ref 70–99)
POTASSIUM: 3.7 mmol/L (ref 3.5–5.1)
Sodium: 138 mmol/L (ref 135–145)

## 2018-04-22 LAB — GLUCOSE, CAPILLARY
Glucose-Capillary: 257 mg/dL — ABNORMAL HIGH (ref 70–99)
Glucose-Capillary: 157 mg/dL — ABNORMAL HIGH (ref 70–99)

## 2018-04-22 MED ORDER — INSULIN ASPART 100 UNIT/ML ~~LOC~~ SOLN: 0.0000 [IU] | Freq: Every day | SUBCUTANEOUS | Status: DC

## 2018-04-22 MED ORDER — INSULIN ASPART 100 UNIT/ML ~~LOC~~ SOLN
0.0000 [IU] | Freq: Three times a day (TID) | SUBCUTANEOUS | Status: DC
  Administered 2018-04-22: 4 [IU] via SUBCUTANEOUS

## 2018-04-22 MED ORDER — POTASSIUM CHLORIDE CRYS ER 20 MEQ PO TBCR
20.0000 meq | EXTENDED_RELEASE_TABLET | Freq: Once | ORAL | Status: AC
  Administered 2018-04-22: 20 meq via ORAL
  Filled 2018-04-22: qty 1

## 2018-04-22 NOTE — Progress Notes (Signed)
AVS reviewed with patient and patient given a copy to have as a reference. Patient dressed, all lines removed, and belongings packed. Patient taken via stretcher for discharge by [facility]. Family notified.

## 2018-04-22 NOTE — TOC Transition Note (Signed)
Transition of Care [facility]) - CM/SW Discharge Note   Patient Details  Name: Nicholas Hardin MRN: [id number] Date of Birth: 04/10/1945  [facility] of [facility] CM/SW Contact:  Gwenlyn Fudge, LCSWA Phone Number: 04/22/2018, 12:50 PM   Clinical Narrative:     Nurse to call report to [phone] and patient will be reporting to room 706P.    Final next level of care: Skilled Nursing Facility Barriers to Discharge: No Barriers Identified   Patient Goals and CMS Choice Patient states their goals for this hospitalization and ongoing recovery are:: To get better   Choice offered to / list presented to : Patient  Discharge Placement   Existing PASRR number confirmed : 04/19/18          Patient chooses bed at: [facility] Patient to be transferred to facility by: [facility] Name of family member notified: Jillyn Hidden  Patient and family notified of of transfer: 04/22/18  Discharge Plan and Services   Discharge Planning Services: CM Consult                      Social Determinants of Health (SDOH) Interventions     Readmission Risk Interventions No flowsheet data found.

## 2018-04-22 NOTE — Progress Notes (Signed)
[facility] PROGRESS NOTE  Nicholas Hardin ZOX:[id number] DOB: Jan 15, 1946 DOA: 04/17/2018  PCP: Sherren Mocha, MD  Brief History/Interval Summary: 73 y.o.malewith medical history significant ofdisability, diabetes, hypertension brought in by family members late evening 04/17/18 because of confusion that was uncharacteristic for his baseline. He was found to have cellulitis of his right lower extremity.  Reason for Visit: Right lower extremity cellulitis.  Acute metabolic encephalopathy.  Consultants: None  Procedures: None  Antibiotics: Clindamycin  Subjective/Interval History: Patient denies any complaints.  States that he slept well.  Had his breakfast without any difficulties.  Denies any pain.     Objective:  Vital Signs  Vitals:   04/21/18 2135 04/21/18 2355 04/22/18 0352 04/22/18 0828  BP: 136/76 116/73 121/76 118/61  Pulse: 96 88 82 86  Resp:  Temp:  97.6 F (36.4 C) 97.7 F (36.5 C) 97.7 F (36.5 C)  TempSrc:  Oral Oral Oral  SpO2:  96% 97% 100%    Intake/Output Summary (Last 24 hours) at 04/22/2018 1011 Last data filed at 04/22/2018 0815 Gross per 24 hour  Intake 360 ml  Output 350 ml  Net 10 ml   There were no vitals filed for this visit.  General appearance: Awake alert.  In no distress Resp: Clear to auscultation bilaterally.  Normal effort Cardio: S1-S2 is normal regular.  No S3-S4.  No rubs murmurs or bruit GI: Abdomen is soft.  Nontender nondistended.  Bowel sounds are present normal.  No masses organomegaly Extremities: Improved erythema over the right lower extremity Neurologic: No focal neurological deficits.    Lab Results:  Data Reviewed: I have personally reviewed following labs and imaging studies  CBC: Recent Labs  Lab 04/17/18 1653 04/18/18 0423 04/19/18 0413 04/21/18 0312  WBC 12.3* 10.3 10.5 11.7*  NEUTROABS 10.0*  --   --   --   HGB 11.1* 10.7* 11.3* 10.9*  HCT 35.4* 33.1* 34.7* 33.5*  MCV 88.9 86.4 86.1  86.3  PLT 278 262 295 278    Basic Metabolic Panel: Recent Labs  Lab 04/19/18 0413 04/20/18 0440 04/21/18 0312 04/21/18 1339 04/22/18 0327  NA 142 137 138 140 138  K 3.5 3.2* 2.9* 4.0 3.7  CL 106 105 106 105 106  CO2 27 20* GLUCOSE 170* 242* 196* 203* 189*  BUN 7* CREATININE 0.90 0.97 0.89 0.98 0.77  CALCIUM 9.4 8.6* 8.7* 9.0 8.8*  MG 2.0  --  2.0  --   --     GFR: CrCl cannot be calculated (Unknown ideal weight.).  Liver Function Tests: Recent Labs  Lab 04/17/18 1653  AST 18  ALT 13  ALKPHOS 64  BILITOT 0.8  PROT 6.4*  ALBUMIN 3.6     Recent Labs  Lab 04/17/18 1653  AMMONIA 15    CBG: Recent Labs  Lab 04/21/18 0630 04/21/18 1149 04/21/18 1742 04/21/18 2120 04/22/18 0607  GLUCAP 214* 192* 190* 375* 257*      Recent Results (from the past 240 hour(s))  Gram stain     Status: None   Collection Time: 04/17/18  4:53 PM  Result Value Ref Range Status   Specimen Description URINE, CLEAN CATCH  Final   Special Requests NONE  Final   Gram Stain   Final    WBC PRESENT,BOTH PMN AND MONONUCLEAR GRAM POSITIVE COCCI GRAM VARIABLE ROD Gram Stain Report Called to,Read Back By and Verified With: Higinio Roger RN [phone]  04/17/18 A BROWNING Performed at [facility], [address], [place], [place] [address]    Report Status 04/17/2018 FINAL  Final      Radiology Studies: No results found.   Medications:  Scheduled: . atorvastatin  10 mg Oral Daily  . clindamycin  300 mg Oral Q8H  . diltiazem  180 mg Oral Daily  . insulin aspart  0-9 Units Subcutaneous TID WC  . insulin glargine  9 Units Subcutaneous Daily  . losartan  12.5 mg Oral Daily  . metoprolol tartrate  25 mg Oral TID  . multivitamin with minerals  1 tablet Oral Daily  . rivaroxaban  20 mg Oral Q supper  . saccharomyces boulardii  250 mg Oral BID  . senna-docusate  4 tablet Oral QHS  . sodium chloride flush  3 mL Intravenous Q12H   Continuous: . sodium chloride      DGL:OVFIEP chloride, acetaminophen **OR** acetaminophen, ondansetron **OR** ondansetron (ZOFRAN) IV, sodium chloride flush    Assessment/Plan:  Acute metabolic encephalopathy Most likely triggered by infection.  CT scan of the head did not show any acute findings.  Patient improved with treatment of his infection.  Mental status seems to be back to baseline.   Right lower extremity cellulitis Patient was placed on clindamycin.  Edema has improved.  Changed over to oral clindamycin.  Continue probiotics.    Vomiting x1 Patient had one episode of emesis on 3/19.  No further episodes noted.  Abdomen remains benign.  He has been tolerating his diet without any difficulties.  Diabetes mellitus type 2 with peripheral circulatory disorder Metformin is on hold.  Continue SSI.  His CBGs are poorly controlled.  We will increase his SSI to resistant coverage.  History of mental disability Patient apparently was living with his cousin.  However he is by himself for most part of the day while the cousin is at work.  Per PT and OT notes he is still not quite back to his baseline that he can manage on his own.  He will need supervision.  History of essential hypertension Continue home medications.  Blood pressure is reasonably well controlled.  Chronic atrial fibrillation, rate controlled Noted to be on rivaroxaban which will be continued.  Hypokalemia Potassium level has improved.  We will give him additional supplement today.  Magnesium was normal.    PT/OT evaluations performed. SNF recommended. SNF appropriate as the patient has received at least 3 days of hospital care and is felt to need rehab services to restore this patient to their prior level of function to achieve safe transition back to home care.  Patient with intellectual disability.  At baseline he is usually able to stay by himself during the day hours.  However he is noted to be deconditioned due to his acute illness and would  be in an unsafe environment if sent home without appropriate rehab.  This patient needs rehab services for at least 5 days per week and skilled nursing services daily to facilitate this transition. Rehab is being requested as the most appropriate d/c option for this patient and is NOT felt to be for custodial care as evidenced by previous level of functioning despite his intellectual disability.   DVT Prophylaxis: On rivaroxaban    Code Status: Full code Family Communication: No family at bedside Disposition Plan: Patient will need to go to skilled nursing facility for rehab.  Await insurance authorization..  Peer-to-peer discussion was held yesterday and insurance was supposed to review  the chart again.    LOS: 3 days   Reegan [employer]ff[employer] on [email or web address]  04/22/2018, 10:11 AM

## 2018-04-25 DIAGNOSIS — E1159 Type 2 diabetes mellitus with other circulatory complications: Secondary | ICD-10-CM | POA: Diagnosis not present

## 2018-04-25 DIAGNOSIS — R2689 Other abnormalities of gait and mobility: Secondary | ICD-10-CM | POA: Diagnosis not present

## 2018-04-25 DIAGNOSIS — I10 Essential (primary) hypertension: Secondary | ICD-10-CM | POA: Diagnosis not present

## 2018-04-25 DIAGNOSIS — L03119 Cellulitis of unspecified part of limb: Secondary | ICD-10-CM | POA: Diagnosis not present

## 2018-05-17 DIAGNOSIS — M6281 Muscle weakness (generalized): Secondary | ICD-10-CM | POA: Diagnosis not present

## 2018-05-17 DIAGNOSIS — R2681 Unsteadiness on feet: Secondary | ICD-10-CM | POA: Diagnosis not present

## 2018-05-17 DIAGNOSIS — R2689 Other abnormalities of gait and mobility: Secondary | ICD-10-CM | POA: Diagnosis not present

## 2018-05-17 DIAGNOSIS — G9341 Metabolic encephalopathy: Secondary | ICD-10-CM | POA: Diagnosis not present

## 2018-05-19 DIAGNOSIS — R2681 Unsteadiness on feet: Secondary | ICD-10-CM | POA: Diagnosis not present

## 2018-05-19 DIAGNOSIS — E1151 Type 2 diabetes mellitus with diabetic peripheral angiopathy without gangrene: Secondary | ICD-10-CM | POA: Diagnosis not present

## 2018-05-19 DIAGNOSIS — E1143 Type 2 diabetes mellitus with diabetic autonomic (poly)neuropathy: Secondary | ICD-10-CM | POA: Diagnosis not present

## 2018-05-19 DIAGNOSIS — I10 Essential (primary) hypertension: Secondary | ICD-10-CM | POA: Diagnosis not present

## 2018-05-19 DIAGNOSIS — G9341 Metabolic encephalopathy: Secondary | ICD-10-CM | POA: Diagnosis not present

## 2018-05-19 DIAGNOSIS — I482 Chronic atrial fibrillation, unspecified: Secondary | ICD-10-CM | POA: Diagnosis not present

## 2018-05-19 DIAGNOSIS — Z7984 Long term (current) use of oral hypoglycemic drugs: Secondary | ICD-10-CM | POA: Diagnosis not present

## 2018-05-19 DIAGNOSIS — R1312 Dysphagia, oropharyngeal phase: Secondary | ICD-10-CM | POA: Diagnosis not present

## 2018-05-22 DIAGNOSIS — R2681 Unsteadiness on feet: Secondary | ICD-10-CM | POA: Diagnosis not present

## 2018-05-22 DIAGNOSIS — E1143 Type 2 diabetes mellitus with diabetic autonomic (poly)neuropathy: Secondary | ICD-10-CM | POA: Diagnosis not present

## 2018-05-22 DIAGNOSIS — I10 Essential (primary) hypertension: Secondary | ICD-10-CM | POA: Diagnosis not present

## 2018-05-22 DIAGNOSIS — E1151 Type 2 diabetes mellitus with diabetic peripheral angiopathy without gangrene: Secondary | ICD-10-CM | POA: Diagnosis not present

## 2018-05-22 DIAGNOSIS — R1312 Dysphagia, oropharyngeal phase: Secondary | ICD-10-CM | POA: Diagnosis not present

## 2018-05-22 DIAGNOSIS — Z7984 Long term (current) use of oral hypoglycemic drugs: Secondary | ICD-10-CM | POA: Diagnosis not present

## 2018-05-22 DIAGNOSIS — I482 Chronic atrial fibrillation, unspecified: Secondary | ICD-10-CM | POA: Diagnosis not present

## 2018-05-22 DIAGNOSIS — G9341 Metabolic encephalopathy: Secondary | ICD-10-CM | POA: Diagnosis not present

## 2018-05-23 DIAGNOSIS — G9341 Metabolic encephalopathy: Secondary | ICD-10-CM | POA: Diagnosis not present

## 2018-05-23 DIAGNOSIS — Z7984 Long term (current) use of oral hypoglycemic drugs: Secondary | ICD-10-CM | POA: Diagnosis not present

## 2018-05-23 DIAGNOSIS — I480 Paroxysmal atrial fibrillation: Secondary | ICD-10-CM | POA: Diagnosis not present

## 2018-05-23 DIAGNOSIS — R1312 Dysphagia, oropharyngeal phase: Secondary | ICD-10-CM | POA: Diagnosis not present

## 2018-05-23 DIAGNOSIS — E782 Mixed hyperlipidemia: Secondary | ICD-10-CM | POA: Diagnosis not present

## 2018-05-23 DIAGNOSIS — E1143 Type 2 diabetes mellitus with diabetic autonomic (poly)neuropathy: Secondary | ICD-10-CM | POA: Diagnosis not present

## 2018-05-23 DIAGNOSIS — I482 Chronic atrial fibrillation, unspecified: Secondary | ICD-10-CM | POA: Diagnosis not present

## 2018-05-23 DIAGNOSIS — L03115 Cellulitis of right lower limb: Secondary | ICD-10-CM | POA: Diagnosis not present

## 2018-05-23 DIAGNOSIS — E1151 Type 2 diabetes mellitus with diabetic peripheral angiopathy without gangrene: Secondary | ICD-10-CM | POA: Diagnosis not present

## 2018-05-23 DIAGNOSIS — R2681 Unsteadiness on feet: Secondary | ICD-10-CM | POA: Diagnosis not present

## 2018-05-23 DIAGNOSIS — E119 Type 2 diabetes mellitus without complications: Secondary | ICD-10-CM | POA: Diagnosis not present

## 2018-05-23 DIAGNOSIS — I10 Essential (primary) hypertension: Secondary | ICD-10-CM | POA: Diagnosis not present

## 2018-05-25 DIAGNOSIS — E1151 Type 2 diabetes mellitus with diabetic peripheral angiopathy without gangrene: Secondary | ICD-10-CM | POA: Diagnosis not present

## 2018-05-25 DIAGNOSIS — E1143 Type 2 diabetes mellitus with diabetic autonomic (poly)neuropathy: Secondary | ICD-10-CM | POA: Diagnosis not present

## 2018-05-25 DIAGNOSIS — Z79899 Other long term (current) drug therapy: Secondary | ICD-10-CM | POA: Diagnosis not present

## 2018-05-25 DIAGNOSIS — R1312 Dysphagia, oropharyngeal phase: Secondary | ICD-10-CM | POA: Diagnosis not present

## 2018-05-25 DIAGNOSIS — I10 Essential (primary) hypertension: Secondary | ICD-10-CM | POA: Diagnosis not present

## 2018-05-25 DIAGNOSIS — Z7984 Long term (current) use of oral hypoglycemic drugs: Secondary | ICD-10-CM | POA: Diagnosis not present

## 2018-05-25 DIAGNOSIS — I482 Chronic atrial fibrillation, unspecified: Secondary | ICD-10-CM | POA: Diagnosis not present

## 2018-05-25 DIAGNOSIS — R2681 Unsteadiness on feet: Secondary | ICD-10-CM | POA: Diagnosis not present

## 2018-05-25 DIAGNOSIS — G9341 Metabolic encephalopathy: Secondary | ICD-10-CM | POA: Diagnosis not present

## 2018-05-30 DIAGNOSIS — Z7984 Long term (current) use of oral hypoglycemic drugs: Secondary | ICD-10-CM | POA: Diagnosis not present

## 2018-05-30 DIAGNOSIS — R1312 Dysphagia, oropharyngeal phase: Secondary | ICD-10-CM | POA: Diagnosis not present

## 2018-05-30 DIAGNOSIS — I10 Essential (primary) hypertension: Secondary | ICD-10-CM | POA: Diagnosis not present

## 2018-05-30 DIAGNOSIS — E1151 Type 2 diabetes mellitus with diabetic peripheral angiopathy without gangrene: Secondary | ICD-10-CM | POA: Diagnosis not present

## 2018-05-30 DIAGNOSIS — E1143 Type 2 diabetes mellitus with diabetic autonomic (poly)neuropathy: Secondary | ICD-10-CM | POA: Diagnosis not present

## 2018-05-30 DIAGNOSIS — R2681 Unsteadiness on feet: Secondary | ICD-10-CM | POA: Diagnosis not present

## 2018-05-30 DIAGNOSIS — I482 Chronic atrial fibrillation, unspecified: Secondary | ICD-10-CM | POA: Diagnosis not present

## 2018-05-30 DIAGNOSIS — G9341 Metabolic encephalopathy: Secondary | ICD-10-CM | POA: Diagnosis not present

## 2018-06-01 DIAGNOSIS — Z7984 Long term (current) use of oral hypoglycemic drugs: Secondary | ICD-10-CM | POA: Diagnosis not present

## 2018-06-01 DIAGNOSIS — E1143 Type 2 diabetes mellitus with diabetic autonomic (poly)neuropathy: Secondary | ICD-10-CM | POA: Diagnosis not present

## 2018-06-01 DIAGNOSIS — I10 Essential (primary) hypertension: Secondary | ICD-10-CM | POA: Diagnosis not present

## 2018-06-01 DIAGNOSIS — R2681 Unsteadiness on feet: Secondary | ICD-10-CM | POA: Diagnosis not present

## 2018-06-01 DIAGNOSIS — E1151 Type 2 diabetes mellitus with diabetic peripheral angiopathy without gangrene: Secondary | ICD-10-CM | POA: Diagnosis not present

## 2018-06-01 DIAGNOSIS — G9341 Metabolic encephalopathy: Secondary | ICD-10-CM | POA: Diagnosis not present

## 2018-06-01 DIAGNOSIS — I482 Chronic atrial fibrillation, unspecified: Secondary | ICD-10-CM | POA: Diagnosis not present

## 2018-06-01 DIAGNOSIS — R1312 Dysphagia, oropharyngeal phase: Secondary | ICD-10-CM | POA: Diagnosis not present

## 2018-06-06 DIAGNOSIS — I482 Chronic atrial fibrillation, unspecified: Secondary | ICD-10-CM | POA: Diagnosis not present

## 2018-06-06 DIAGNOSIS — R2681 Unsteadiness on feet: Secondary | ICD-10-CM | POA: Diagnosis not present

## 2018-06-06 DIAGNOSIS — E1143 Type 2 diabetes mellitus with diabetic autonomic (poly)neuropathy: Secondary | ICD-10-CM | POA: Diagnosis not present

## 2018-06-06 DIAGNOSIS — G9341 Metabolic encephalopathy: Secondary | ICD-10-CM | POA: Diagnosis not present

## 2018-06-06 DIAGNOSIS — Z7984 Long term (current) use of oral hypoglycemic drugs: Secondary | ICD-10-CM | POA: Diagnosis not present

## 2018-06-06 DIAGNOSIS — I10 Essential (primary) hypertension: Secondary | ICD-10-CM | POA: Diagnosis not present

## 2018-06-06 DIAGNOSIS — R1312 Dysphagia, oropharyngeal phase: Secondary | ICD-10-CM | POA: Diagnosis not present

## 2018-06-06 DIAGNOSIS — E1151 Type 2 diabetes mellitus with diabetic peripheral angiopathy without gangrene: Secondary | ICD-10-CM | POA: Diagnosis not present

## 2018-06-07 DIAGNOSIS — I482 Chronic atrial fibrillation, unspecified: Secondary | ICD-10-CM | POA: Diagnosis not present

## 2018-06-07 DIAGNOSIS — Z7984 Long term (current) use of oral hypoglycemic drugs: Secondary | ICD-10-CM | POA: Diagnosis not present

## 2018-06-07 DIAGNOSIS — R1312 Dysphagia, oropharyngeal phase: Secondary | ICD-10-CM | POA: Diagnosis not present

## 2018-06-07 DIAGNOSIS — E1151 Type 2 diabetes mellitus with diabetic peripheral angiopathy without gangrene: Secondary | ICD-10-CM | POA: Diagnosis not present

## 2018-06-07 DIAGNOSIS — G9341 Metabolic encephalopathy: Secondary | ICD-10-CM | POA: Diagnosis not present

## 2018-06-07 DIAGNOSIS — E1143 Type 2 diabetes mellitus with diabetic autonomic (poly)neuropathy: Secondary | ICD-10-CM | POA: Diagnosis not present

## 2018-06-07 DIAGNOSIS — I10 Essential (primary) hypertension: Secondary | ICD-10-CM | POA: Diagnosis not present

## 2018-06-07 DIAGNOSIS — R2681 Unsteadiness on feet: Secondary | ICD-10-CM | POA: Diagnosis not present

## 2018-06-08 DIAGNOSIS — I10 Essential (primary) hypertension: Secondary | ICD-10-CM | POA: Diagnosis not present

## 2018-06-08 DIAGNOSIS — I482 Chronic atrial fibrillation, unspecified: Secondary | ICD-10-CM | POA: Diagnosis not present

## 2018-06-08 DIAGNOSIS — G9341 Metabolic encephalopathy: Secondary | ICD-10-CM | POA: Diagnosis not present

## 2018-06-08 DIAGNOSIS — R2681 Unsteadiness on feet: Secondary | ICD-10-CM | POA: Diagnosis not present

## 2018-06-08 DIAGNOSIS — E1151 Type 2 diabetes mellitus with diabetic peripheral angiopathy without gangrene: Secondary | ICD-10-CM | POA: Diagnosis not present

## 2018-06-08 DIAGNOSIS — R1312 Dysphagia, oropharyngeal phase: Secondary | ICD-10-CM | POA: Diagnosis not present

## 2018-06-08 DIAGNOSIS — E1143 Type 2 diabetes mellitus with diabetic autonomic (poly)neuropathy: Secondary | ICD-10-CM | POA: Diagnosis not present

## 2018-06-08 DIAGNOSIS — Z7984 Long term (current) use of oral hypoglycemic drugs: Secondary | ICD-10-CM | POA: Diagnosis not present

## 2018-06-09 DIAGNOSIS — I482 Chronic atrial fibrillation, unspecified: Secondary | ICD-10-CM | POA: Diagnosis not present

## 2018-06-09 DIAGNOSIS — R1312 Dysphagia, oropharyngeal phase: Secondary | ICD-10-CM | POA: Diagnosis not present

## 2018-06-09 DIAGNOSIS — E1151 Type 2 diabetes mellitus with diabetic peripheral angiopathy without gangrene: Secondary | ICD-10-CM | POA: Diagnosis not present

## 2018-06-09 DIAGNOSIS — Z7984 Long term (current) use of oral hypoglycemic drugs: Secondary | ICD-10-CM | POA: Diagnosis not present

## 2018-06-09 DIAGNOSIS — G9341 Metabolic encephalopathy: Secondary | ICD-10-CM | POA: Diagnosis not present

## 2018-06-09 DIAGNOSIS — I10 Essential (primary) hypertension: Secondary | ICD-10-CM | POA: Diagnosis not present

## 2018-06-09 DIAGNOSIS — E1143 Type 2 diabetes mellitus with diabetic autonomic (poly)neuropathy: Secondary | ICD-10-CM | POA: Diagnosis not present

## 2018-06-09 DIAGNOSIS — R2681 Unsteadiness on feet: Secondary | ICD-10-CM | POA: Diagnosis not present

## 2018-06-12 DIAGNOSIS — E1151 Type 2 diabetes mellitus with diabetic peripheral angiopathy without gangrene: Secondary | ICD-10-CM | POA: Diagnosis not present

## 2018-06-12 DIAGNOSIS — I482 Chronic atrial fibrillation, unspecified: Secondary | ICD-10-CM | POA: Diagnosis not present

## 2018-06-12 DIAGNOSIS — Z7984 Long term (current) use of oral hypoglycemic drugs: Secondary | ICD-10-CM | POA: Diagnosis not present

## 2018-06-12 DIAGNOSIS — R2681 Unsteadiness on feet: Secondary | ICD-10-CM | POA: Diagnosis not present

## 2018-06-12 DIAGNOSIS — R1312 Dysphagia, oropharyngeal phase: Secondary | ICD-10-CM | POA: Diagnosis not present

## 2018-06-12 DIAGNOSIS — G9341 Metabolic encephalopathy: Secondary | ICD-10-CM | POA: Diagnosis not present

## 2018-06-12 DIAGNOSIS — I10 Essential (primary) hypertension: Secondary | ICD-10-CM | POA: Diagnosis not present

## 2018-06-12 DIAGNOSIS — E1143 Type 2 diabetes mellitus with diabetic autonomic (poly)neuropathy: Secondary | ICD-10-CM | POA: Diagnosis not present

## 2018-06-13 DIAGNOSIS — G9341 Metabolic encephalopathy: Secondary | ICD-10-CM | POA: Diagnosis not present

## 2018-06-13 DIAGNOSIS — E1151 Type 2 diabetes mellitus with diabetic peripheral angiopathy without gangrene: Secondary | ICD-10-CM | POA: Diagnosis not present

## 2018-06-13 DIAGNOSIS — R269 Unspecified abnormalities of gait and mobility: Secondary | ICD-10-CM | POA: Diagnosis not present

## 2018-06-13 DIAGNOSIS — E782 Mixed hyperlipidemia: Secondary | ICD-10-CM | POA: Diagnosis not present

## 2018-06-13 DIAGNOSIS — R946 Abnormal results of thyroid function studies: Secondary | ICD-10-CM | POA: Diagnosis not present

## 2018-06-13 DIAGNOSIS — I10 Essential (primary) hypertension: Secondary | ICD-10-CM | POA: Diagnosis not present

## 2018-06-13 DIAGNOSIS — I482 Chronic atrial fibrillation, unspecified: Secondary | ICD-10-CM | POA: Diagnosis not present

## 2018-06-13 DIAGNOSIS — E119 Type 2 diabetes mellitus without complications: Secondary | ICD-10-CM | POA: Diagnosis not present

## 2018-06-13 DIAGNOSIS — Z7984 Long term (current) use of oral hypoglycemic drugs: Secondary | ICD-10-CM | POA: Diagnosis not present

## 2018-06-13 DIAGNOSIS — R1312 Dysphagia, oropharyngeal phase: Secondary | ICD-10-CM | POA: Diagnosis not present

## 2018-06-13 DIAGNOSIS — E1143 Type 2 diabetes mellitus with diabetic autonomic (poly)neuropathy: Secondary | ICD-10-CM | POA: Diagnosis not present

## 2018-06-13 DIAGNOSIS — R2681 Unsteadiness on feet: Secondary | ICD-10-CM | POA: Diagnosis not present

## 2018-06-14 DIAGNOSIS — E1143 Type 2 diabetes mellitus with diabetic autonomic (poly)neuropathy: Secondary | ICD-10-CM | POA: Diagnosis not present

## 2018-06-14 DIAGNOSIS — G9341 Metabolic encephalopathy: Secondary | ICD-10-CM | POA: Diagnosis not present

## 2018-06-14 DIAGNOSIS — E1151 Type 2 diabetes mellitus with diabetic peripheral angiopathy without gangrene: Secondary | ICD-10-CM | POA: Diagnosis not present

## 2018-06-14 DIAGNOSIS — R1312 Dysphagia, oropharyngeal phase: Secondary | ICD-10-CM | POA: Diagnosis not present

## 2018-06-14 DIAGNOSIS — R2681 Unsteadiness on feet: Secondary | ICD-10-CM | POA: Diagnosis not present

## 2018-06-14 DIAGNOSIS — I482 Chronic atrial fibrillation, unspecified: Secondary | ICD-10-CM | POA: Diagnosis not present

## 2018-06-14 DIAGNOSIS — Z7984 Long term (current) use of oral hypoglycemic drugs: Secondary | ICD-10-CM | POA: Diagnosis not present

## 2018-06-14 DIAGNOSIS — I10 Essential (primary) hypertension: Secondary | ICD-10-CM | POA: Diagnosis not present

## 2018-06-15 DIAGNOSIS — E1143 Type 2 diabetes mellitus with diabetic autonomic (poly)neuropathy: Secondary | ICD-10-CM | POA: Diagnosis not present

## 2018-06-15 DIAGNOSIS — I482 Chronic atrial fibrillation, unspecified: Secondary | ICD-10-CM | POA: Diagnosis not present

## 2018-06-15 DIAGNOSIS — Z7984 Long term (current) use of oral hypoglycemic drugs: Secondary | ICD-10-CM | POA: Diagnosis not present

## 2018-06-15 DIAGNOSIS — R2681 Unsteadiness on feet: Secondary | ICD-10-CM | POA: Diagnosis not present

## 2018-06-15 DIAGNOSIS — E1151 Type 2 diabetes mellitus with diabetic peripheral angiopathy without gangrene: Secondary | ICD-10-CM | POA: Diagnosis not present

## 2018-06-15 DIAGNOSIS — G9341 Metabolic encephalopathy: Secondary | ICD-10-CM | POA: Diagnosis not present

## 2018-06-15 DIAGNOSIS — R1312 Dysphagia, oropharyngeal phase: Secondary | ICD-10-CM | POA: Diagnosis not present

## 2018-06-15 DIAGNOSIS — I10 Essential (primary) hypertension: Secondary | ICD-10-CM | POA: Diagnosis not present

## 2018-06-19 DIAGNOSIS — Z7984 Long term (current) use of oral hypoglycemic drugs: Secondary | ICD-10-CM | POA: Diagnosis not present

## 2018-06-19 DIAGNOSIS — G9341 Metabolic encephalopathy: Secondary | ICD-10-CM | POA: Diagnosis not present

## 2018-06-19 DIAGNOSIS — R2681 Unsteadiness on feet: Secondary | ICD-10-CM | POA: Diagnosis not present

## 2018-06-19 DIAGNOSIS — E1151 Type 2 diabetes mellitus with diabetic peripheral angiopathy without gangrene: Secondary | ICD-10-CM | POA: Diagnosis not present

## 2018-06-19 DIAGNOSIS — I10 Essential (primary) hypertension: Secondary | ICD-10-CM | POA: Diagnosis not present

## 2018-06-19 DIAGNOSIS — R1312 Dysphagia, oropharyngeal phase: Secondary | ICD-10-CM | POA: Diagnosis not present

## 2018-06-19 DIAGNOSIS — E1143 Type 2 diabetes mellitus with diabetic autonomic (poly)neuropathy: Secondary | ICD-10-CM | POA: Diagnosis not present

## 2018-06-19 DIAGNOSIS — I482 Chronic atrial fibrillation, unspecified: Secondary | ICD-10-CM | POA: Diagnosis not present

## 2018-06-20 DIAGNOSIS — E1143 Type 2 diabetes mellitus with diabetic autonomic (poly)neuropathy: Secondary | ICD-10-CM | POA: Diagnosis not present

## 2018-06-20 DIAGNOSIS — G9341 Metabolic encephalopathy: Secondary | ICD-10-CM | POA: Diagnosis not present

## 2018-06-20 DIAGNOSIS — E1151 Type 2 diabetes mellitus with diabetic peripheral angiopathy without gangrene: Secondary | ICD-10-CM | POA: Diagnosis not present

## 2018-06-20 DIAGNOSIS — R1312 Dysphagia, oropharyngeal phase: Secondary | ICD-10-CM | POA: Diagnosis not present

## 2018-06-20 DIAGNOSIS — I10 Essential (primary) hypertension: Secondary | ICD-10-CM | POA: Diagnosis not present

## 2018-06-20 DIAGNOSIS — I482 Chronic atrial fibrillation, unspecified: Secondary | ICD-10-CM | POA: Diagnosis not present

## 2018-06-20 DIAGNOSIS — R2681 Unsteadiness on feet: Secondary | ICD-10-CM | POA: Diagnosis not present

## 2018-06-20 DIAGNOSIS — Z7984 Long term (current) use of oral hypoglycemic drugs: Secondary | ICD-10-CM | POA: Diagnosis not present

## 2018-06-22 DIAGNOSIS — I10 Essential (primary) hypertension: Secondary | ICD-10-CM | POA: Diagnosis not present

## 2018-06-22 DIAGNOSIS — R1312 Dysphagia, oropharyngeal phase: Secondary | ICD-10-CM | POA: Diagnosis not present

## 2018-06-22 DIAGNOSIS — G9341 Metabolic encephalopathy: Secondary | ICD-10-CM | POA: Diagnosis not present

## 2018-06-22 DIAGNOSIS — E1143 Type 2 diabetes mellitus with diabetic autonomic (poly)neuropathy: Secondary | ICD-10-CM | POA: Diagnosis not present

## 2018-06-22 DIAGNOSIS — Z7984 Long term (current) use of oral hypoglycemic drugs: Secondary | ICD-10-CM | POA: Diagnosis not present

## 2018-06-22 DIAGNOSIS — I482 Chronic atrial fibrillation, unspecified: Secondary | ICD-10-CM | POA: Diagnosis not present

## 2018-06-22 DIAGNOSIS — R2681 Unsteadiness on feet: Secondary | ICD-10-CM | POA: Diagnosis not present

## 2018-06-22 DIAGNOSIS — E1151 Type 2 diabetes mellitus with diabetic peripheral angiopathy without gangrene: Secondary | ICD-10-CM | POA: Diagnosis not present

## 2018-06-27 DIAGNOSIS — E782 Mixed hyperlipidemia: Secondary | ICD-10-CM | POA: Diagnosis not present

## 2018-06-27 DIAGNOSIS — I10 Essential (primary) hypertension: Secondary | ICD-10-CM | POA: Diagnosis not present

## 2018-06-27 DIAGNOSIS — R946 Abnormal results of thyroid function studies: Secondary | ICD-10-CM | POA: Diagnosis not present

## 2018-06-27 DIAGNOSIS — R2681 Unsteadiness on feet: Secondary | ICD-10-CM | POA: Diagnosis not present

## 2018-06-27 DIAGNOSIS — G9341 Metabolic encephalopathy: Secondary | ICD-10-CM | POA: Diagnosis not present

## 2018-06-27 DIAGNOSIS — E1151 Type 2 diabetes mellitus with diabetic peripheral angiopathy without gangrene: Secondary | ICD-10-CM | POA: Diagnosis not present

## 2018-06-27 DIAGNOSIS — R1312 Dysphagia, oropharyngeal phase: Secondary | ICD-10-CM | POA: Diagnosis not present

## 2018-06-27 DIAGNOSIS — I482 Chronic atrial fibrillation, unspecified: Secondary | ICD-10-CM | POA: Diagnosis not present

## 2018-06-27 DIAGNOSIS — E119 Type 2 diabetes mellitus without complications: Secondary | ICD-10-CM | POA: Diagnosis not present

## 2018-06-27 DIAGNOSIS — Z7984 Long term (current) use of oral hypoglycemic drugs: Secondary | ICD-10-CM | POA: Diagnosis not present

## 2018-06-27 DIAGNOSIS — E1143 Type 2 diabetes mellitus with diabetic autonomic (poly)neuropathy: Secondary | ICD-10-CM | POA: Diagnosis not present

## 2018-06-27 DIAGNOSIS — R269 Unspecified abnormalities of gait and mobility: Secondary | ICD-10-CM | POA: Diagnosis not present

## 2018-07-11 DIAGNOSIS — R269 Unspecified abnormalities of gait and mobility: Secondary | ICD-10-CM | POA: Diagnosis not present

## 2018-07-11 DIAGNOSIS — E119 Type 2 diabetes mellitus without complications: Secondary | ICD-10-CM | POA: Diagnosis not present

## 2018-07-11 DIAGNOSIS — I10 Essential (primary) hypertension: Secondary | ICD-10-CM | POA: Diagnosis not present

## 2018-09-04 ENCOUNTER — Ambulatory Visit: Payer: Self-pay | Admitting: Cardiology

## 2019-10-05 DIAGNOSIS — I872 Venous insufficiency (chronic) (peripheral): Secondary | ICD-10-CM | POA: Diagnosis not present

## 2019-10-05 DIAGNOSIS — E782 Mixed hyperlipidemia: Secondary | ICD-10-CM | POA: Diagnosis not present

## 2019-10-05 DIAGNOSIS — D649 Anemia, unspecified: Secondary | ICD-10-CM | POA: Diagnosis not present

## 2019-10-05 DIAGNOSIS — L97811 Non-pressure chronic ulcer of other part of right lower leg limited to breakdown of skin: Secondary | ICD-10-CM | POA: Diagnosis not present

## 2019-10-05 DIAGNOSIS — E1151 Type 2 diabetes mellitus with diabetic peripheral angiopathy without gangrene: Secondary | ICD-10-CM | POA: Diagnosis not present

## 2019-10-05 DIAGNOSIS — I480 Paroxysmal atrial fibrillation: Secondary | ICD-10-CM | POA: Diagnosis not present

## 2019-10-05 DIAGNOSIS — Z7901 Long term (current) use of anticoagulants: Secondary | ICD-10-CM | POA: Diagnosis not present

## 2019-10-05 DIAGNOSIS — Z7984 Long term (current) use of oral hypoglycemic drugs: Secondary | ICD-10-CM | POA: Diagnosis not present

## 2019-10-05 DIAGNOSIS — I10 Essential (primary) hypertension: Secondary | ICD-10-CM | POA: Diagnosis not present

## 2019-10-05 DIAGNOSIS — T50B95D Adverse effect of other viral vaccines, subsequent encounter: Secondary | ICD-10-CM | POA: Diagnosis not present

## 2019-10-05 DIAGNOSIS — M1711 Unilateral primary osteoarthritis, right knee: Secondary | ICD-10-CM | POA: Diagnosis not present

## 2019-10-09 DIAGNOSIS — E1151 Type 2 diabetes mellitus with diabetic peripheral angiopathy without gangrene: Secondary | ICD-10-CM | POA: Diagnosis not present

## 2019-10-09 DIAGNOSIS — Z7901 Long term (current) use of anticoagulants: Secondary | ICD-10-CM | POA: Diagnosis not present

## 2019-10-09 DIAGNOSIS — M1711 Unilateral primary osteoarthritis, right knee: Secondary | ICD-10-CM | POA: Diagnosis not present

## 2019-10-09 DIAGNOSIS — T50B95D Adverse effect of other viral vaccines, subsequent encounter: Secondary | ICD-10-CM | POA: Diagnosis not present

## 2019-10-09 DIAGNOSIS — D649 Anemia, unspecified: Secondary | ICD-10-CM | POA: Diagnosis not present

## 2019-10-09 DIAGNOSIS — I872 Venous insufficiency (chronic) (peripheral): Secondary | ICD-10-CM | POA: Diagnosis not present

## 2019-10-09 DIAGNOSIS — I10 Essential (primary) hypertension: Secondary | ICD-10-CM | POA: Diagnosis not present

## 2019-10-09 DIAGNOSIS — Z7984 Long term (current) use of oral hypoglycemic drugs: Secondary | ICD-10-CM | POA: Diagnosis not present

## 2019-10-09 DIAGNOSIS — E782 Mixed hyperlipidemia: Secondary | ICD-10-CM | POA: Diagnosis not present

## 2019-10-09 DIAGNOSIS — I480 Paroxysmal atrial fibrillation: Secondary | ICD-10-CM | POA: Diagnosis not present

## 2019-10-09 DIAGNOSIS — L97811 Non-pressure chronic ulcer of other part of right lower leg limited to breakdown of skin: Secondary | ICD-10-CM | POA: Diagnosis not present

## 2019-10-11 DIAGNOSIS — I480 Paroxysmal atrial fibrillation: Secondary | ICD-10-CM | POA: Diagnosis not present

## 2019-10-11 DIAGNOSIS — I872 Venous insufficiency (chronic) (peripheral): Secondary | ICD-10-CM | POA: Diagnosis not present

## 2019-10-11 DIAGNOSIS — Z7901 Long term (current) use of anticoagulants: Secondary | ICD-10-CM | POA: Diagnosis not present

## 2019-10-11 DIAGNOSIS — I10 Essential (primary) hypertension: Secondary | ICD-10-CM | POA: Diagnosis not present

## 2019-10-11 DIAGNOSIS — E782 Mixed hyperlipidemia: Secondary | ICD-10-CM | POA: Diagnosis not present

## 2019-10-11 DIAGNOSIS — M1711 Unilateral primary osteoarthritis, right knee: Secondary | ICD-10-CM | POA: Diagnosis not present

## 2019-10-11 DIAGNOSIS — Z7984 Long term (current) use of oral hypoglycemic drugs: Secondary | ICD-10-CM | POA: Diagnosis not present

## 2019-10-11 DIAGNOSIS — E1151 Type 2 diabetes mellitus with diabetic peripheral angiopathy without gangrene: Secondary | ICD-10-CM | POA: Diagnosis not present

## 2019-10-11 DIAGNOSIS — D649 Anemia, unspecified: Secondary | ICD-10-CM | POA: Diagnosis not present

## 2019-10-11 DIAGNOSIS — L97811 Non-pressure chronic ulcer of other part of right lower leg limited to breakdown of skin: Secondary | ICD-10-CM | POA: Diagnosis not present

## 2019-10-11 DIAGNOSIS — T50B95D Adverse effect of other viral vaccines, subsequent encounter: Secondary | ICD-10-CM | POA: Diagnosis not present

## 2019-10-16 DIAGNOSIS — T50B95D Adverse effect of other viral vaccines, subsequent encounter: Secondary | ICD-10-CM | POA: Diagnosis not present

## 2019-10-16 DIAGNOSIS — Z7901 Long term (current) use of anticoagulants: Secondary | ICD-10-CM | POA: Diagnosis not present

## 2019-10-16 DIAGNOSIS — Z7984 Long term (current) use of oral hypoglycemic drugs: Secondary | ICD-10-CM | POA: Diagnosis not present

## 2019-10-16 DIAGNOSIS — D649 Anemia, unspecified: Secondary | ICD-10-CM | POA: Diagnosis not present

## 2019-10-16 DIAGNOSIS — I480 Paroxysmal atrial fibrillation: Secondary | ICD-10-CM | POA: Diagnosis not present

## 2019-10-16 DIAGNOSIS — I872 Venous insufficiency (chronic) (peripheral): Secondary | ICD-10-CM | POA: Diagnosis not present

## 2019-10-16 DIAGNOSIS — I10 Essential (primary) hypertension: Secondary | ICD-10-CM | POA: Diagnosis not present

## 2019-10-16 DIAGNOSIS — E782 Mixed hyperlipidemia: Secondary | ICD-10-CM | POA: Diagnosis not present

## 2019-10-16 DIAGNOSIS — L97811 Non-pressure chronic ulcer of other part of right lower leg limited to breakdown of skin: Secondary | ICD-10-CM | POA: Diagnosis not present

## 2019-10-16 DIAGNOSIS — E1151 Type 2 diabetes mellitus with diabetic peripheral angiopathy without gangrene: Secondary | ICD-10-CM | POA: Diagnosis not present

## 2019-10-16 DIAGNOSIS — M1711 Unilateral primary osteoarthritis, right knee: Secondary | ICD-10-CM | POA: Diagnosis not present

## 2019-10-16 DIAGNOSIS — L84 Corns and callosities: Secondary | ICD-10-CM | POA: Diagnosis not present

## 2019-10-17 DIAGNOSIS — R600 Localized edema: Secondary | ICD-10-CM | POA: Diagnosis not present

## 2019-10-17 DIAGNOSIS — Z7984 Long term (current) use of oral hypoglycemic drugs: Secondary | ICD-10-CM | POA: Diagnosis not present

## 2019-10-17 DIAGNOSIS — L97919 Non-pressure chronic ulcer of unspecified part of right lower leg with unspecified severity: Secondary | ICD-10-CM | POA: Diagnosis not present

## 2019-10-17 DIAGNOSIS — I872 Venous insufficiency (chronic) (peripheral): Secondary | ICD-10-CM | POA: Diagnosis not present

## 2019-10-17 DIAGNOSIS — I10 Essential (primary) hypertension: Secondary | ICD-10-CM | POA: Diagnosis not present

## 2019-10-17 DIAGNOSIS — E119 Type 2 diabetes mellitus without complications: Secondary | ICD-10-CM | POA: Diagnosis not present

## 2019-10-18 DIAGNOSIS — Z7984 Long term (current) use of oral hypoglycemic drugs: Secondary | ICD-10-CM | POA: Diagnosis not present

## 2019-10-18 DIAGNOSIS — D649 Anemia, unspecified: Secondary | ICD-10-CM | POA: Diagnosis not present

## 2019-10-18 DIAGNOSIS — I872 Venous insufficiency (chronic) (peripheral): Secondary | ICD-10-CM | POA: Diagnosis not present

## 2019-10-18 DIAGNOSIS — T50B95D Adverse effect of other viral vaccines, subsequent encounter: Secondary | ICD-10-CM | POA: Diagnosis not present

## 2019-10-18 DIAGNOSIS — E1151 Type 2 diabetes mellitus with diabetic peripheral angiopathy without gangrene: Secondary | ICD-10-CM | POA: Diagnosis not present

## 2019-10-18 DIAGNOSIS — I480 Paroxysmal atrial fibrillation: Secondary | ICD-10-CM | POA: Diagnosis not present

## 2019-10-18 DIAGNOSIS — M1711 Unilateral primary osteoarthritis, right knee: Secondary | ICD-10-CM | POA: Diagnosis not present

## 2019-10-18 DIAGNOSIS — I10 Essential (primary) hypertension: Secondary | ICD-10-CM | POA: Diagnosis not present

## 2019-10-18 DIAGNOSIS — L97811 Non-pressure chronic ulcer of other part of right lower leg limited to breakdown of skin: Secondary | ICD-10-CM | POA: Diagnosis not present

## 2019-10-18 DIAGNOSIS — Z7901 Long term (current) use of anticoagulants: Secondary | ICD-10-CM | POA: Diagnosis not present

## 2019-10-18 DIAGNOSIS — E782 Mixed hyperlipidemia: Secondary | ICD-10-CM | POA: Diagnosis not present

## 2019-10-22 DIAGNOSIS — L97811 Non-pressure chronic ulcer of other part of right lower leg limited to breakdown of skin: Secondary | ICD-10-CM | POA: Diagnosis not present

## 2019-10-22 DIAGNOSIS — I10 Essential (primary) hypertension: Secondary | ICD-10-CM | POA: Diagnosis not present

## 2019-10-22 DIAGNOSIS — D649 Anemia, unspecified: Secondary | ICD-10-CM | POA: Diagnosis not present

## 2019-10-22 DIAGNOSIS — T50B95D Adverse effect of other viral vaccines, subsequent encounter: Secondary | ICD-10-CM | POA: Diagnosis not present

## 2019-10-22 DIAGNOSIS — M1711 Unilateral primary osteoarthritis, right knee: Secondary | ICD-10-CM | POA: Diagnosis not present

## 2019-10-22 DIAGNOSIS — Z7984 Long term (current) use of oral hypoglycemic drugs: Secondary | ICD-10-CM | POA: Diagnosis not present

## 2019-10-22 DIAGNOSIS — E782 Mixed hyperlipidemia: Secondary | ICD-10-CM | POA: Diagnosis not present

## 2019-10-22 DIAGNOSIS — I480 Paroxysmal atrial fibrillation: Secondary | ICD-10-CM | POA: Diagnosis not present

## 2019-10-22 DIAGNOSIS — Z7901 Long term (current) use of anticoagulants: Secondary | ICD-10-CM | POA: Diagnosis not present

## 2019-10-22 DIAGNOSIS — E1151 Type 2 diabetes mellitus with diabetic peripheral angiopathy without gangrene: Secondary | ICD-10-CM | POA: Diagnosis not present

## 2019-10-22 DIAGNOSIS — I872 Venous insufficiency (chronic) (peripheral): Secondary | ICD-10-CM | POA: Diagnosis not present

## 2019-10-24 DIAGNOSIS — L97811 Non-pressure chronic ulcer of other part of right lower leg limited to breakdown of skin: Secondary | ICD-10-CM | POA: Diagnosis not present

## 2019-10-24 DIAGNOSIS — Z7984 Long term (current) use of oral hypoglycemic drugs: Secondary | ICD-10-CM | POA: Diagnosis not present

## 2019-10-24 DIAGNOSIS — Z7901 Long term (current) use of anticoagulants: Secondary | ICD-10-CM | POA: Diagnosis not present

## 2019-10-24 DIAGNOSIS — I480 Paroxysmal atrial fibrillation: Secondary | ICD-10-CM | POA: Diagnosis not present

## 2019-10-24 DIAGNOSIS — M1711 Unilateral primary osteoarthritis, right knee: Secondary | ICD-10-CM | POA: Diagnosis not present

## 2019-10-24 DIAGNOSIS — E782 Mixed hyperlipidemia: Secondary | ICD-10-CM | POA: Diagnosis not present

## 2019-10-24 DIAGNOSIS — I10 Essential (primary) hypertension: Secondary | ICD-10-CM | POA: Diagnosis not present

## 2019-10-24 DIAGNOSIS — D649 Anemia, unspecified: Secondary | ICD-10-CM | POA: Diagnosis not present

## 2019-10-24 DIAGNOSIS — T50B95D Adverse effect of other viral vaccines, subsequent encounter: Secondary | ICD-10-CM | POA: Diagnosis not present

## 2019-10-24 DIAGNOSIS — I872 Venous insufficiency (chronic) (peripheral): Secondary | ICD-10-CM | POA: Diagnosis not present

## 2019-10-24 DIAGNOSIS — E1151 Type 2 diabetes mellitus with diabetic peripheral angiopathy without gangrene: Secondary | ICD-10-CM | POA: Diagnosis not present

## 2019-10-29 DIAGNOSIS — M1711 Unilateral primary osteoarthritis, right knee: Secondary | ICD-10-CM | POA: Diagnosis not present

## 2019-10-29 DIAGNOSIS — T50B95D Adverse effect of other viral vaccines, subsequent encounter: Secondary | ICD-10-CM | POA: Diagnosis not present

## 2019-10-29 DIAGNOSIS — E782 Mixed hyperlipidemia: Secondary | ICD-10-CM | POA: Diagnosis not present

## 2019-10-29 DIAGNOSIS — E1151 Type 2 diabetes mellitus with diabetic peripheral angiopathy without gangrene: Secondary | ICD-10-CM | POA: Diagnosis not present

## 2019-10-29 DIAGNOSIS — I872 Venous insufficiency (chronic) (peripheral): Secondary | ICD-10-CM | POA: Diagnosis not present

## 2019-10-29 DIAGNOSIS — I10 Essential (primary) hypertension: Secondary | ICD-10-CM | POA: Diagnosis not present

## 2019-10-29 DIAGNOSIS — L97811 Non-pressure chronic ulcer of other part of right lower leg limited to breakdown of skin: Secondary | ICD-10-CM | POA: Diagnosis not present

## 2019-10-29 DIAGNOSIS — D649 Anemia, unspecified: Secondary | ICD-10-CM | POA: Diagnosis not present

## 2019-10-29 DIAGNOSIS — Z7984 Long term (current) use of oral hypoglycemic drugs: Secondary | ICD-10-CM | POA: Diagnosis not present

## 2019-10-29 DIAGNOSIS — Z7901 Long term (current) use of anticoagulants: Secondary | ICD-10-CM | POA: Diagnosis not present

## 2019-10-29 DIAGNOSIS — I480 Paroxysmal atrial fibrillation: Secondary | ICD-10-CM | POA: Diagnosis not present

## 2019-11-01 DIAGNOSIS — D649 Anemia, unspecified: Secondary | ICD-10-CM | POA: Diagnosis not present

## 2019-11-01 DIAGNOSIS — E782 Mixed hyperlipidemia: Secondary | ICD-10-CM | POA: Diagnosis not present

## 2019-11-01 DIAGNOSIS — E1151 Type 2 diabetes mellitus with diabetic peripheral angiopathy without gangrene: Secondary | ICD-10-CM | POA: Diagnosis not present

## 2019-11-01 DIAGNOSIS — I10 Essential (primary) hypertension: Secondary | ICD-10-CM | POA: Diagnosis not present

## 2019-11-01 DIAGNOSIS — L97811 Non-pressure chronic ulcer of other part of right lower leg limited to breakdown of skin: Secondary | ICD-10-CM | POA: Diagnosis not present

## 2019-11-01 DIAGNOSIS — Z7901 Long term (current) use of anticoagulants: Secondary | ICD-10-CM | POA: Diagnosis not present

## 2019-11-01 DIAGNOSIS — I480 Paroxysmal atrial fibrillation: Secondary | ICD-10-CM | POA: Diagnosis not present

## 2019-11-01 DIAGNOSIS — T50B95D Adverse effect of other viral vaccines, subsequent encounter: Secondary | ICD-10-CM | POA: Diagnosis not present

## 2019-11-01 DIAGNOSIS — M1711 Unilateral primary osteoarthritis, right knee: Secondary | ICD-10-CM | POA: Diagnosis not present

## 2019-11-01 DIAGNOSIS — I872 Venous insufficiency (chronic) (peripheral): Secondary | ICD-10-CM | POA: Diagnosis not present

## 2019-11-01 DIAGNOSIS — Z7984 Long term (current) use of oral hypoglycemic drugs: Secondary | ICD-10-CM | POA: Diagnosis not present

## 2019-11-05 DIAGNOSIS — L97811 Non-pressure chronic ulcer of other part of right lower leg limited to breakdown of skin: Secondary | ICD-10-CM | POA: Diagnosis not present

## 2019-11-05 DIAGNOSIS — I872 Venous insufficiency (chronic) (peripheral): Secondary | ICD-10-CM | POA: Diagnosis not present

## 2019-11-05 DIAGNOSIS — I10 Essential (primary) hypertension: Secondary | ICD-10-CM | POA: Diagnosis not present

## 2019-11-05 DIAGNOSIS — D649 Anemia, unspecified: Secondary | ICD-10-CM | POA: Diagnosis not present

## 2019-11-05 DIAGNOSIS — E782 Mixed hyperlipidemia: Secondary | ICD-10-CM | POA: Diagnosis not present

## 2019-11-05 DIAGNOSIS — I480 Paroxysmal atrial fibrillation: Secondary | ICD-10-CM | POA: Diagnosis not present

## 2019-11-05 DIAGNOSIS — Z7984 Long term (current) use of oral hypoglycemic drugs: Secondary | ICD-10-CM | POA: Diagnosis not present

## 2019-11-05 DIAGNOSIS — Z7901 Long term (current) use of anticoagulants: Secondary | ICD-10-CM | POA: Diagnosis not present

## 2019-11-05 DIAGNOSIS — E1151 Type 2 diabetes mellitus with diabetic peripheral angiopathy without gangrene: Secondary | ICD-10-CM | POA: Diagnosis not present

## 2019-11-05 DIAGNOSIS — M1711 Unilateral primary osteoarthritis, right knee: Secondary | ICD-10-CM | POA: Diagnosis not present

## 2019-11-05 DIAGNOSIS — T50B95D Adverse effect of other viral vaccines, subsequent encounter: Secondary | ICD-10-CM | POA: Diagnosis not present

## 2019-11-10 DIAGNOSIS — I6523 Occlusion and stenosis of bilateral carotid arteries: Secondary | ICD-10-CM | POA: Diagnosis not present

## 2019-11-10 DIAGNOSIS — H748X1 Other specified disorders of right middle ear and mastoid: Secondary | ICD-10-CM | POA: Diagnosis not present

## 2019-11-10 DIAGNOSIS — I10 Essential (primary) hypertension: Secondary | ICD-10-CM | POA: Diagnosis not present

## 2019-11-10 DIAGNOSIS — Z882 Allergy status to sulfonamides status: Secondary | ICD-10-CM | POA: Diagnosis not present

## 2019-11-10 DIAGNOSIS — E785 Hyperlipidemia, unspecified: Secondary | ICD-10-CM | POA: Diagnosis not present

## 2019-11-10 DIAGNOSIS — E1142 Type 2 diabetes mellitus with diabetic polyneuropathy: Secondary | ICD-10-CM | POA: Diagnosis not present

## 2019-11-10 DIAGNOSIS — R404 Transient alteration of awareness: Secondary | ICD-10-CM | POA: Diagnosis not present

## 2019-11-10 DIAGNOSIS — I517 Cardiomegaly: Secondary | ICD-10-CM | POA: Diagnosis not present

## 2019-11-10 DIAGNOSIS — Z781 Physical restraint status: Secondary | ICD-10-CM | POA: Diagnosis not present

## 2019-11-10 DIAGNOSIS — R2689 Other abnormalities of gait and mobility: Secondary | ICD-10-CM | POA: Diagnosis not present

## 2019-11-10 DIAGNOSIS — I4891 Unspecified atrial fibrillation: Secondary | ICD-10-CM | POA: Diagnosis not present

## 2019-11-10 DIAGNOSIS — R569 Unspecified convulsions: Secondary | ICD-10-CM | POA: Diagnosis not present

## 2019-11-10 DIAGNOSIS — J3489 Other specified disorders of nose and nasal sinuses: Secondary | ICD-10-CM | POA: Diagnosis not present

## 2019-11-10 DIAGNOSIS — Z66 Do not resuscitate: Secondary | ICD-10-CM | POA: Diagnosis not present

## 2019-11-10 DIAGNOSIS — Z743 Need for continuous supervision: Secondary | ICD-10-CM | POA: Diagnosis not present

## 2019-11-10 DIAGNOSIS — I480 Paroxysmal atrial fibrillation: Secondary | ICD-10-CM | POA: Diagnosis not present

## 2019-11-10 DIAGNOSIS — Z7401 Bed confinement status: Secondary | ICD-10-CM | POA: Diagnosis not present

## 2019-11-10 DIAGNOSIS — G9341 Metabolic encephalopathy: Secondary | ICD-10-CM | POA: Diagnosis not present

## 2019-11-10 DIAGNOSIS — I499 Cardiac arrhythmia, unspecified: Secondary | ICD-10-CM | POA: Diagnosis not present

## 2019-11-10 DIAGNOSIS — Z79899 Other long term (current) drug therapy: Secondary | ICD-10-CM | POA: Diagnosis not present

## 2019-11-10 DIAGNOSIS — E611 Iron deficiency: Secondary | ICD-10-CM | POA: Diagnosis not present

## 2019-11-10 DIAGNOSIS — M2550 Pain in unspecified joint: Secondary | ICD-10-CM | POA: Diagnosis not present

## 2019-11-10 DIAGNOSIS — D638 Anemia in other chronic diseases classified elsewhere: Secondary | ICD-10-CM | POA: Diagnosis not present

## 2019-11-10 DIAGNOSIS — R21 Rash and other nonspecific skin eruption: Secondary | ICD-10-CM | POA: Diagnosis not present

## 2019-11-10 DIAGNOSIS — D72829 Elevated white blood cell count, unspecified: Secondary | ICD-10-CM | POA: Diagnosis not present

## 2019-11-10 DIAGNOSIS — Z7984 Long term (current) use of oral hypoglycemic drugs: Secondary | ICD-10-CM | POA: Diagnosis not present

## 2019-11-10 DIAGNOSIS — I6782 Cerebral ischemia: Secondary | ICD-10-CM | POA: Diagnosis not present

## 2019-11-10 DIAGNOSIS — E1165 Type 2 diabetes mellitus with hyperglycemia: Secondary | ICD-10-CM | POA: Diagnosis not present

## 2019-11-10 DIAGNOSIS — Z7901 Long term (current) use of anticoagulants: Secondary | ICD-10-CM | POA: Diagnosis not present

## 2019-11-11 DIAGNOSIS — I4891 Unspecified atrial fibrillation: Secondary | ICD-10-CM | POA: Diagnosis not present

## 2019-11-11 DIAGNOSIS — D72829 Elevated white blood cell count, unspecified: Secondary | ICD-10-CM | POA: Diagnosis not present

## 2019-11-11 DIAGNOSIS — R569 Unspecified convulsions: Secondary | ICD-10-CM | POA: Diagnosis not present

## 2019-11-11 DIAGNOSIS — G9341 Metabolic encephalopathy: Secondary | ICD-10-CM | POA: Diagnosis not present

## 2019-11-12 DIAGNOSIS — R569 Unspecified convulsions: Secondary | ICD-10-CM | POA: Diagnosis not present

## 2019-11-12 DIAGNOSIS — D72829 Elevated white blood cell count, unspecified: Secondary | ICD-10-CM | POA: Diagnosis not present

## 2019-11-12 DIAGNOSIS — I6782 Cerebral ischemia: Secondary | ICD-10-CM | POA: Diagnosis not present

## 2019-11-12 DIAGNOSIS — I10 Essential (primary) hypertension: Secondary | ICD-10-CM | POA: Diagnosis not present

## 2019-11-12 DIAGNOSIS — H748X1 Other specified disorders of right middle ear and mastoid: Secondary | ICD-10-CM | POA: Diagnosis not present

## 2019-11-12 DIAGNOSIS — J3489 Other specified disorders of nose and nasal sinuses: Secondary | ICD-10-CM | POA: Diagnosis not present

## 2019-11-12 DIAGNOSIS — G9341 Metabolic encephalopathy: Secondary | ICD-10-CM | POA: Diagnosis not present

## 2019-11-13 DIAGNOSIS — I10 Essential (primary) hypertension: Secondary | ICD-10-CM | POA: Diagnosis not present

## 2019-11-13 DIAGNOSIS — D72829 Elevated white blood cell count, unspecified: Secondary | ICD-10-CM | POA: Diagnosis not present

## 2019-11-13 DIAGNOSIS — G9341 Metabolic encephalopathy: Secondary | ICD-10-CM | POA: Diagnosis not present

## 2019-11-13 DIAGNOSIS — R569 Unspecified convulsions: Secondary | ICD-10-CM | POA: Diagnosis not present

## 2019-11-14 DIAGNOSIS — I10 Essential (primary) hypertension: Secondary | ICD-10-CM | POA: Diagnosis not present

## 2019-11-14 DIAGNOSIS — R404 Transient alteration of awareness: Secondary | ICD-10-CM | POA: Diagnosis not present

## 2019-11-14 DIAGNOSIS — R569 Unspecified convulsions: Secondary | ICD-10-CM | POA: Diagnosis not present

## 2019-11-14 DIAGNOSIS — D72829 Elevated white blood cell count, unspecified: Secondary | ICD-10-CM | POA: Diagnosis not present

## 2019-11-14 DIAGNOSIS — Z743 Need for continuous supervision: Secondary | ICD-10-CM | POA: Diagnosis not present

## 2019-11-14 DIAGNOSIS — M2550 Pain in unspecified joint: Secondary | ICD-10-CM | POA: Diagnosis not present

## 2019-11-14 DIAGNOSIS — G9341 Metabolic encephalopathy: Secondary | ICD-10-CM | POA: Diagnosis not present

## 2019-11-14 DIAGNOSIS — Z7401 Bed confinement status: Secondary | ICD-10-CM | POA: Diagnosis not present

## 2019-11-16 DIAGNOSIS — T50B95D Adverse effect of other viral vaccines, subsequent encounter: Secondary | ICD-10-CM | POA: Diagnosis not present

## 2019-11-16 DIAGNOSIS — L97811 Non-pressure chronic ulcer of other part of right lower leg limited to breakdown of skin: Secondary | ICD-10-CM | POA: Diagnosis not present

## 2019-11-16 DIAGNOSIS — E782 Mixed hyperlipidemia: Secondary | ICD-10-CM | POA: Diagnosis not present

## 2019-11-16 DIAGNOSIS — E1151 Type 2 diabetes mellitus with diabetic peripheral angiopathy without gangrene: Secondary | ICD-10-CM | POA: Diagnosis not present

## 2019-11-16 DIAGNOSIS — I480 Paroxysmal atrial fibrillation: Secondary | ICD-10-CM | POA: Diagnosis not present

## 2019-11-16 DIAGNOSIS — Z7984 Long term (current) use of oral hypoglycemic drugs: Secondary | ICD-10-CM | POA: Diagnosis not present

## 2019-11-16 DIAGNOSIS — I10 Essential (primary) hypertension: Secondary | ICD-10-CM | POA: Diagnosis not present

## 2019-11-16 DIAGNOSIS — Z7901 Long term (current) use of anticoagulants: Secondary | ICD-10-CM | POA: Diagnosis not present

## 2019-11-16 DIAGNOSIS — D649 Anemia, unspecified: Secondary | ICD-10-CM | POA: Diagnosis not present

## 2019-11-16 DIAGNOSIS — I872 Venous insufficiency (chronic) (peripheral): Secondary | ICD-10-CM | POA: Diagnosis not present

## 2019-11-16 DIAGNOSIS — M1711 Unilateral primary osteoarthritis, right knee: Secondary | ICD-10-CM | POA: Diagnosis not present

## 2019-11-17 DIAGNOSIS — I10 Essential (primary) hypertension: Secondary | ICD-10-CM | POA: Diagnosis not present

## 2019-11-17 DIAGNOSIS — T50B95D Adverse effect of other viral vaccines, subsequent encounter: Secondary | ICD-10-CM | POA: Diagnosis not present

## 2019-11-17 DIAGNOSIS — D649 Anemia, unspecified: Secondary | ICD-10-CM | POA: Diagnosis not present

## 2019-11-17 DIAGNOSIS — I480 Paroxysmal atrial fibrillation: Secondary | ICD-10-CM | POA: Diagnosis not present

## 2019-11-17 DIAGNOSIS — L97811 Non-pressure chronic ulcer of other part of right lower leg limited to breakdown of skin: Secondary | ICD-10-CM | POA: Diagnosis not present

## 2019-11-17 DIAGNOSIS — E1151 Type 2 diabetes mellitus with diabetic peripheral angiopathy without gangrene: Secondary | ICD-10-CM | POA: Diagnosis not present

## 2019-11-17 DIAGNOSIS — I872 Venous insufficiency (chronic) (peripheral): Secondary | ICD-10-CM | POA: Diagnosis not present

## 2019-11-17 DIAGNOSIS — Z7984 Long term (current) use of oral hypoglycemic drugs: Secondary | ICD-10-CM | POA: Diagnosis not present

## 2019-11-17 DIAGNOSIS — M1711 Unilateral primary osteoarthritis, right knee: Secondary | ICD-10-CM | POA: Diagnosis not present

## 2019-11-17 DIAGNOSIS — Z7901 Long term (current) use of anticoagulants: Secondary | ICD-10-CM | POA: Diagnosis not present

## 2019-11-17 DIAGNOSIS — E782 Mixed hyperlipidemia: Secondary | ICD-10-CM | POA: Diagnosis not present

## 2019-11-19 DIAGNOSIS — Z7901 Long term (current) use of anticoagulants: Secondary | ICD-10-CM | POA: Diagnosis not present

## 2019-11-19 DIAGNOSIS — I480 Paroxysmal atrial fibrillation: Secondary | ICD-10-CM | POA: Diagnosis not present

## 2019-11-19 DIAGNOSIS — E1151 Type 2 diabetes mellitus with diabetic peripheral angiopathy without gangrene: Secondary | ICD-10-CM | POA: Diagnosis not present

## 2019-11-19 DIAGNOSIS — Z7984 Long term (current) use of oral hypoglycemic drugs: Secondary | ICD-10-CM | POA: Diagnosis not present

## 2019-11-19 DIAGNOSIS — D649 Anemia, unspecified: Secondary | ICD-10-CM | POA: Diagnosis not present

## 2019-11-19 DIAGNOSIS — I872 Venous insufficiency (chronic) (peripheral): Secondary | ICD-10-CM | POA: Diagnosis not present

## 2019-11-19 DIAGNOSIS — M1711 Unilateral primary osteoarthritis, right knee: Secondary | ICD-10-CM | POA: Diagnosis not present

## 2019-11-19 DIAGNOSIS — T50B95D Adverse effect of other viral vaccines, subsequent encounter: Secondary | ICD-10-CM | POA: Diagnosis not present

## 2019-11-19 DIAGNOSIS — E782 Mixed hyperlipidemia: Secondary | ICD-10-CM | POA: Diagnosis not present

## 2019-11-19 DIAGNOSIS — I10 Essential (primary) hypertension: Secondary | ICD-10-CM | POA: Diagnosis not present

## 2019-11-19 DIAGNOSIS — L97811 Non-pressure chronic ulcer of other part of right lower leg limited to breakdown of skin: Secondary | ICD-10-CM | POA: Diagnosis not present

## 2019-11-21 DIAGNOSIS — E1151 Type 2 diabetes mellitus with diabetic peripheral angiopathy without gangrene: Secondary | ICD-10-CM | POA: Diagnosis not present

## 2019-11-21 DIAGNOSIS — D649 Anemia, unspecified: Secondary | ICD-10-CM | POA: Diagnosis not present

## 2019-11-21 DIAGNOSIS — I480 Paroxysmal atrial fibrillation: Secondary | ICD-10-CM | POA: Diagnosis not present

## 2019-11-21 DIAGNOSIS — L97811 Non-pressure chronic ulcer of other part of right lower leg limited to breakdown of skin: Secondary | ICD-10-CM | POA: Diagnosis not present

## 2019-11-21 DIAGNOSIS — Z7901 Long term (current) use of anticoagulants: Secondary | ICD-10-CM | POA: Diagnosis not present

## 2019-11-21 DIAGNOSIS — T50B95D Adverse effect of other viral vaccines, subsequent encounter: Secondary | ICD-10-CM | POA: Diagnosis not present

## 2019-11-21 DIAGNOSIS — I872 Venous insufficiency (chronic) (peripheral): Secondary | ICD-10-CM | POA: Diagnosis not present

## 2019-11-21 DIAGNOSIS — Z7984 Long term (current) use of oral hypoglycemic drugs: Secondary | ICD-10-CM | POA: Diagnosis not present

## 2019-11-21 DIAGNOSIS — M1711 Unilateral primary osteoarthritis, right knee: Secondary | ICD-10-CM | POA: Diagnosis not present

## 2019-11-21 DIAGNOSIS — I10 Essential (primary) hypertension: Secondary | ICD-10-CM | POA: Diagnosis not present

## 2019-11-21 DIAGNOSIS — E782 Mixed hyperlipidemia: Secondary | ICD-10-CM | POA: Diagnosis not present

## 2019-11-22 DIAGNOSIS — I10 Essential (primary) hypertension: Secondary | ICD-10-CM | POA: Diagnosis not present

## 2019-11-22 DIAGNOSIS — E782 Mixed hyperlipidemia: Secondary | ICD-10-CM | POA: Diagnosis not present

## 2019-11-22 DIAGNOSIS — D649 Anemia, unspecified: Secondary | ICD-10-CM | POA: Diagnosis not present

## 2019-11-22 DIAGNOSIS — I480 Paroxysmal atrial fibrillation: Secondary | ICD-10-CM | POA: Diagnosis not present

## 2019-11-22 DIAGNOSIS — T50B95D Adverse effect of other viral vaccines, subsequent encounter: Secondary | ICD-10-CM | POA: Diagnosis not present

## 2019-11-22 DIAGNOSIS — E1151 Type 2 diabetes mellitus with diabetic peripheral angiopathy without gangrene: Secondary | ICD-10-CM | POA: Diagnosis not present

## 2019-11-22 DIAGNOSIS — L97811 Non-pressure chronic ulcer of other part of right lower leg limited to breakdown of skin: Secondary | ICD-10-CM | POA: Diagnosis not present

## 2019-11-22 DIAGNOSIS — Z7901 Long term (current) use of anticoagulants: Secondary | ICD-10-CM | POA: Diagnosis not present

## 2019-11-22 DIAGNOSIS — I872 Venous insufficiency (chronic) (peripheral): Secondary | ICD-10-CM | POA: Diagnosis not present

## 2019-11-22 DIAGNOSIS — M1711 Unilateral primary osteoarthritis, right knee: Secondary | ICD-10-CM | POA: Diagnosis not present

## 2019-11-22 DIAGNOSIS — Z7984 Long term (current) use of oral hypoglycemic drugs: Secondary | ICD-10-CM | POA: Diagnosis not present

## 2019-11-24 DIAGNOSIS — Z7901 Long term (current) use of anticoagulants: Secondary | ICD-10-CM | POA: Diagnosis not present

## 2019-11-24 DIAGNOSIS — Z7984 Long term (current) use of oral hypoglycemic drugs: Secondary | ICD-10-CM | POA: Diagnosis not present

## 2019-11-24 DIAGNOSIS — T50B95D Adverse effect of other viral vaccines, subsequent encounter: Secondary | ICD-10-CM | POA: Diagnosis not present

## 2019-11-24 DIAGNOSIS — M1711 Unilateral primary osteoarthritis, right knee: Secondary | ICD-10-CM | POA: Diagnosis not present

## 2019-11-24 DIAGNOSIS — E1151 Type 2 diabetes mellitus with diabetic peripheral angiopathy without gangrene: Secondary | ICD-10-CM | POA: Diagnosis not present

## 2019-11-24 DIAGNOSIS — E782 Mixed hyperlipidemia: Secondary | ICD-10-CM | POA: Diagnosis not present

## 2019-11-24 DIAGNOSIS — I480 Paroxysmal atrial fibrillation: Secondary | ICD-10-CM | POA: Diagnosis not present

## 2019-11-24 DIAGNOSIS — L97811 Non-pressure chronic ulcer of other part of right lower leg limited to breakdown of skin: Secondary | ICD-10-CM | POA: Diagnosis not present

## 2019-11-24 DIAGNOSIS — I872 Venous insufficiency (chronic) (peripheral): Secondary | ICD-10-CM | POA: Diagnosis not present

## 2019-11-24 DIAGNOSIS — I10 Essential (primary) hypertension: Secondary | ICD-10-CM | POA: Diagnosis not present

## 2019-11-24 DIAGNOSIS — D649 Anemia, unspecified: Secondary | ICD-10-CM | POA: Diagnosis not present

## 2019-11-27 DIAGNOSIS — Z7984 Long term (current) use of oral hypoglycemic drugs: Secondary | ICD-10-CM | POA: Diagnosis not present

## 2019-11-27 DIAGNOSIS — Z7901 Long term (current) use of anticoagulants: Secondary | ICD-10-CM | POA: Diagnosis not present

## 2019-11-27 DIAGNOSIS — T50B95D Adverse effect of other viral vaccines, subsequent encounter: Secondary | ICD-10-CM | POA: Diagnosis not present

## 2019-11-27 DIAGNOSIS — L97811 Non-pressure chronic ulcer of other part of right lower leg limited to breakdown of skin: Secondary | ICD-10-CM | POA: Diagnosis not present

## 2019-11-27 DIAGNOSIS — I872 Venous insufficiency (chronic) (peripheral): Secondary | ICD-10-CM | POA: Diagnosis not present

## 2019-11-27 DIAGNOSIS — M1711 Unilateral primary osteoarthritis, right knee: Secondary | ICD-10-CM | POA: Diagnosis not present

## 2019-11-27 DIAGNOSIS — I480 Paroxysmal atrial fibrillation: Secondary | ICD-10-CM | POA: Diagnosis not present

## 2019-11-27 DIAGNOSIS — E782 Mixed hyperlipidemia: Secondary | ICD-10-CM | POA: Diagnosis not present

## 2019-11-27 DIAGNOSIS — E1151 Type 2 diabetes mellitus with diabetic peripheral angiopathy without gangrene: Secondary | ICD-10-CM | POA: Diagnosis not present

## 2019-11-27 DIAGNOSIS — I10 Essential (primary) hypertension: Secondary | ICD-10-CM | POA: Diagnosis not present

## 2019-11-27 DIAGNOSIS — D649 Anemia, unspecified: Secondary | ICD-10-CM | POA: Diagnosis not present

## 2019-11-29 DIAGNOSIS — E1151 Type 2 diabetes mellitus with diabetic peripheral angiopathy without gangrene: Secondary | ICD-10-CM | POA: Diagnosis not present

## 2019-11-29 DIAGNOSIS — I10 Essential (primary) hypertension: Secondary | ICD-10-CM | POA: Diagnosis not present

## 2019-11-29 DIAGNOSIS — L97811 Non-pressure chronic ulcer of other part of right lower leg limited to breakdown of skin: Secondary | ICD-10-CM | POA: Diagnosis not present

## 2019-11-29 DIAGNOSIS — Z7984 Long term (current) use of oral hypoglycemic drugs: Secondary | ICD-10-CM | POA: Diagnosis not present

## 2019-11-29 DIAGNOSIS — Z7901 Long term (current) use of anticoagulants: Secondary | ICD-10-CM | POA: Diagnosis not present

## 2019-11-29 DIAGNOSIS — D649 Anemia, unspecified: Secondary | ICD-10-CM | POA: Diagnosis not present

## 2019-11-29 DIAGNOSIS — I480 Paroxysmal atrial fibrillation: Secondary | ICD-10-CM | POA: Diagnosis not present

## 2019-11-29 DIAGNOSIS — M1711 Unilateral primary osteoarthritis, right knee: Secondary | ICD-10-CM | POA: Diagnosis not present

## 2019-11-29 DIAGNOSIS — I872 Venous insufficiency (chronic) (peripheral): Secondary | ICD-10-CM | POA: Diagnosis not present

## 2019-11-29 DIAGNOSIS — T50B95D Adverse effect of other viral vaccines, subsequent encounter: Secondary | ICD-10-CM | POA: Diagnosis not present

## 2019-11-29 DIAGNOSIS — E782 Mixed hyperlipidemia: Secondary | ICD-10-CM | POA: Diagnosis not present

## 2019-11-30 DIAGNOSIS — M1711 Unilateral primary osteoarthritis, right knee: Secondary | ICD-10-CM | POA: Diagnosis not present

## 2019-11-30 DIAGNOSIS — E782 Mixed hyperlipidemia: Secondary | ICD-10-CM | POA: Diagnosis not present

## 2019-11-30 DIAGNOSIS — I480 Paroxysmal atrial fibrillation: Secondary | ICD-10-CM | POA: Diagnosis not present

## 2019-11-30 DIAGNOSIS — T50B95D Adverse effect of other viral vaccines, subsequent encounter: Secondary | ICD-10-CM | POA: Diagnosis not present

## 2019-11-30 DIAGNOSIS — I10 Essential (primary) hypertension: Secondary | ICD-10-CM | POA: Diagnosis not present

## 2019-11-30 DIAGNOSIS — Z7984 Long term (current) use of oral hypoglycemic drugs: Secondary | ICD-10-CM | POA: Diagnosis not present

## 2019-11-30 DIAGNOSIS — L97811 Non-pressure chronic ulcer of other part of right lower leg limited to breakdown of skin: Secondary | ICD-10-CM | POA: Diagnosis not present

## 2019-11-30 DIAGNOSIS — D649 Anemia, unspecified: Secondary | ICD-10-CM | POA: Diagnosis not present

## 2019-11-30 DIAGNOSIS — E1151 Type 2 diabetes mellitus with diabetic peripheral angiopathy without gangrene: Secondary | ICD-10-CM | POA: Diagnosis not present

## 2019-11-30 DIAGNOSIS — Z7901 Long term (current) use of anticoagulants: Secondary | ICD-10-CM | POA: Diagnosis not present

## 2019-11-30 DIAGNOSIS — I872 Venous insufficiency (chronic) (peripheral): Secondary | ICD-10-CM | POA: Diagnosis not present

## 2019-12-03 DIAGNOSIS — Z7984 Long term (current) use of oral hypoglycemic drugs: Secondary | ICD-10-CM | POA: Diagnosis not present

## 2019-12-03 DIAGNOSIS — E1151 Type 2 diabetes mellitus with diabetic peripheral angiopathy without gangrene: Secondary | ICD-10-CM | POA: Diagnosis not present

## 2019-12-03 DIAGNOSIS — Z4800 Encounter for change or removal of nonsurgical wound dressing: Secondary | ICD-10-CM | POA: Diagnosis not present

## 2019-12-03 DIAGNOSIS — E782 Mixed hyperlipidemia: Secondary | ICD-10-CM | POA: Diagnosis not present

## 2019-12-03 DIAGNOSIS — L97811 Non-pressure chronic ulcer of other part of right lower leg limited to breakdown of skin: Secondary | ICD-10-CM | POA: Diagnosis not present

## 2019-12-03 DIAGNOSIS — M1711 Unilateral primary osteoarthritis, right knee: Secondary | ICD-10-CM | POA: Diagnosis not present

## 2019-12-03 DIAGNOSIS — Z7901 Long term (current) use of anticoagulants: Secondary | ICD-10-CM | POA: Diagnosis not present

## 2019-12-03 DIAGNOSIS — I872 Venous insufficiency (chronic) (peripheral): Secondary | ICD-10-CM | POA: Diagnosis not present

## 2019-12-03 DIAGNOSIS — E559 Vitamin D deficiency, unspecified: Secondary | ICD-10-CM | POA: Diagnosis not present

## 2019-12-03 DIAGNOSIS — T50B95D Adverse effect of other viral vaccines, subsequent encounter: Secondary | ICD-10-CM | POA: Diagnosis not present

## 2019-12-03 DIAGNOSIS — I480 Paroxysmal atrial fibrillation: Secondary | ICD-10-CM | POA: Diagnosis not present

## 2019-12-03 DIAGNOSIS — E538 Deficiency of other specified B group vitamins: Secondary | ICD-10-CM | POA: Diagnosis not present

## 2019-12-03 DIAGNOSIS — D649 Anemia, unspecified: Secondary | ICD-10-CM | POA: Diagnosis not present

## 2019-12-03 DIAGNOSIS — I10 Essential (primary) hypertension: Secondary | ICD-10-CM | POA: Diagnosis not present

## 2019-12-04 DIAGNOSIS — Z7984 Long term (current) use of oral hypoglycemic drugs: Secondary | ICD-10-CM | POA: Diagnosis not present

## 2019-12-04 DIAGNOSIS — E782 Mixed hyperlipidemia: Secondary | ICD-10-CM | POA: Diagnosis not present

## 2019-12-04 DIAGNOSIS — M1711 Unilateral primary osteoarthritis, right knee: Secondary | ICD-10-CM | POA: Diagnosis not present

## 2019-12-04 DIAGNOSIS — Z7901 Long term (current) use of anticoagulants: Secondary | ICD-10-CM | POA: Diagnosis not present

## 2019-12-04 DIAGNOSIS — E538 Deficiency of other specified B group vitamins: Secondary | ICD-10-CM | POA: Diagnosis not present

## 2019-12-04 DIAGNOSIS — I872 Venous insufficiency (chronic) (peripheral): Secondary | ICD-10-CM | POA: Diagnosis not present

## 2019-12-04 DIAGNOSIS — I480 Paroxysmal atrial fibrillation: Secondary | ICD-10-CM | POA: Diagnosis not present

## 2019-12-04 DIAGNOSIS — I10 Essential (primary) hypertension: Secondary | ICD-10-CM | POA: Diagnosis not present

## 2019-12-04 DIAGNOSIS — E1151 Type 2 diabetes mellitus with diabetic peripheral angiopathy without gangrene: Secondary | ICD-10-CM | POA: Diagnosis not present

## 2019-12-04 DIAGNOSIS — L97811 Non-pressure chronic ulcer of other part of right lower leg limited to breakdown of skin: Secondary | ICD-10-CM | POA: Diagnosis not present

## 2019-12-04 DIAGNOSIS — E559 Vitamin D deficiency, unspecified: Secondary | ICD-10-CM | POA: Diagnosis not present

## 2019-12-04 DIAGNOSIS — Z4800 Encounter for change or removal of nonsurgical wound dressing: Secondary | ICD-10-CM | POA: Diagnosis not present

## 2019-12-04 DIAGNOSIS — T50B95D Adverse effect of other viral vaccines, subsequent encounter: Secondary | ICD-10-CM | POA: Diagnosis not present

## 2019-12-04 DIAGNOSIS — D649 Anemia, unspecified: Secondary | ICD-10-CM | POA: Diagnosis not present

## 2019-12-05 DIAGNOSIS — D649 Anemia, unspecified: Secondary | ICD-10-CM | POA: Diagnosis not present

## 2019-12-05 DIAGNOSIS — Z7984 Long term (current) use of oral hypoglycemic drugs: Secondary | ICD-10-CM | POA: Diagnosis not present

## 2019-12-05 DIAGNOSIS — E1151 Type 2 diabetes mellitus with diabetic peripheral angiopathy without gangrene: Secondary | ICD-10-CM | POA: Diagnosis not present

## 2019-12-05 DIAGNOSIS — Z7901 Long term (current) use of anticoagulants: Secondary | ICD-10-CM | POA: Diagnosis not present

## 2019-12-05 DIAGNOSIS — E782 Mixed hyperlipidemia: Secondary | ICD-10-CM | POA: Diagnosis not present

## 2019-12-05 DIAGNOSIS — E538 Deficiency of other specified B group vitamins: Secondary | ICD-10-CM | POA: Diagnosis not present

## 2019-12-05 DIAGNOSIS — I10 Essential (primary) hypertension: Secondary | ICD-10-CM | POA: Diagnosis not present

## 2019-12-05 DIAGNOSIS — M1711 Unilateral primary osteoarthritis, right knee: Secondary | ICD-10-CM | POA: Diagnosis not present

## 2019-12-05 DIAGNOSIS — I872 Venous insufficiency (chronic) (peripheral): Secondary | ICD-10-CM | POA: Diagnosis not present

## 2019-12-05 DIAGNOSIS — T50B95D Adverse effect of other viral vaccines, subsequent encounter: Secondary | ICD-10-CM | POA: Diagnosis not present

## 2019-12-05 DIAGNOSIS — Z4800 Encounter for change or removal of nonsurgical wound dressing: Secondary | ICD-10-CM | POA: Diagnosis not present

## 2019-12-05 DIAGNOSIS — I480 Paroxysmal atrial fibrillation: Secondary | ICD-10-CM | POA: Diagnosis not present

## 2019-12-05 DIAGNOSIS — L97811 Non-pressure chronic ulcer of other part of right lower leg limited to breakdown of skin: Secondary | ICD-10-CM | POA: Diagnosis not present

## 2019-12-05 DIAGNOSIS — E559 Vitamin D deficiency, unspecified: Secondary | ICD-10-CM | POA: Diagnosis not present

## 2019-12-06 DIAGNOSIS — E559 Vitamin D deficiency, unspecified: Secondary | ICD-10-CM | POA: Diagnosis not present

## 2019-12-06 DIAGNOSIS — Z7901 Long term (current) use of anticoagulants: Secondary | ICD-10-CM | POA: Diagnosis not present

## 2019-12-06 DIAGNOSIS — I10 Essential (primary) hypertension: Secondary | ICD-10-CM | POA: Diagnosis not present

## 2019-12-06 DIAGNOSIS — E782 Mixed hyperlipidemia: Secondary | ICD-10-CM | POA: Diagnosis not present

## 2019-12-06 DIAGNOSIS — I872 Venous insufficiency (chronic) (peripheral): Secondary | ICD-10-CM | POA: Diagnosis not present

## 2019-12-06 DIAGNOSIS — L97811 Non-pressure chronic ulcer of other part of right lower leg limited to breakdown of skin: Secondary | ICD-10-CM | POA: Diagnosis not present

## 2019-12-06 DIAGNOSIS — I480 Paroxysmal atrial fibrillation: Secondary | ICD-10-CM | POA: Diagnosis not present

## 2019-12-06 DIAGNOSIS — Z4800 Encounter for change or removal of nonsurgical wound dressing: Secondary | ICD-10-CM | POA: Diagnosis not present

## 2019-12-06 DIAGNOSIS — D649 Anemia, unspecified: Secondary | ICD-10-CM | POA: Diagnosis not present

## 2019-12-06 DIAGNOSIS — E1151 Type 2 diabetes mellitus with diabetic peripheral angiopathy without gangrene: Secondary | ICD-10-CM | POA: Diagnosis not present

## 2019-12-06 DIAGNOSIS — E538 Deficiency of other specified B group vitamins: Secondary | ICD-10-CM | POA: Diagnosis not present

## 2019-12-06 DIAGNOSIS — Z7984 Long term (current) use of oral hypoglycemic drugs: Secondary | ICD-10-CM | POA: Diagnosis not present

## 2019-12-06 DIAGNOSIS — M1711 Unilateral primary osteoarthritis, right knee: Secondary | ICD-10-CM | POA: Diagnosis not present

## 2019-12-06 DIAGNOSIS — T50B95D Adverse effect of other viral vaccines, subsequent encounter: Secondary | ICD-10-CM | POA: Diagnosis not present

## 2019-12-08 DIAGNOSIS — L97811 Non-pressure chronic ulcer of other part of right lower leg limited to breakdown of skin: Secondary | ICD-10-CM | POA: Diagnosis not present

## 2019-12-08 DIAGNOSIS — I480 Paroxysmal atrial fibrillation: Secondary | ICD-10-CM | POA: Diagnosis not present

## 2019-12-08 DIAGNOSIS — T50B95D Adverse effect of other viral vaccines, subsequent encounter: Secondary | ICD-10-CM | POA: Diagnosis not present

## 2019-12-08 DIAGNOSIS — Z7984 Long term (current) use of oral hypoglycemic drugs: Secondary | ICD-10-CM | POA: Diagnosis not present

## 2019-12-08 DIAGNOSIS — I872 Venous insufficiency (chronic) (peripheral): Secondary | ICD-10-CM | POA: Diagnosis not present

## 2019-12-08 DIAGNOSIS — D649 Anemia, unspecified: Secondary | ICD-10-CM | POA: Diagnosis not present

## 2019-12-08 DIAGNOSIS — M1711 Unilateral primary osteoarthritis, right knee: Secondary | ICD-10-CM | POA: Diagnosis not present

## 2019-12-08 DIAGNOSIS — E538 Deficiency of other specified B group vitamins: Secondary | ICD-10-CM | POA: Diagnosis not present

## 2019-12-08 DIAGNOSIS — E1151 Type 2 diabetes mellitus with diabetic peripheral angiopathy without gangrene: Secondary | ICD-10-CM | POA: Diagnosis not present

## 2019-12-08 DIAGNOSIS — Z4800 Encounter for change or removal of nonsurgical wound dressing: Secondary | ICD-10-CM | POA: Diagnosis not present

## 2019-12-08 DIAGNOSIS — E559 Vitamin D deficiency, unspecified: Secondary | ICD-10-CM | POA: Diagnosis not present

## 2019-12-08 DIAGNOSIS — I10 Essential (primary) hypertension: Secondary | ICD-10-CM | POA: Diagnosis not present

## 2019-12-08 DIAGNOSIS — Z7901 Long term (current) use of anticoagulants: Secondary | ICD-10-CM | POA: Diagnosis not present

## 2019-12-08 DIAGNOSIS — E782 Mixed hyperlipidemia: Secondary | ICD-10-CM | POA: Diagnosis not present

## 2019-12-11 DIAGNOSIS — L97811 Non-pressure chronic ulcer of other part of right lower leg limited to breakdown of skin: Secondary | ICD-10-CM | POA: Diagnosis not present

## 2019-12-11 DIAGNOSIS — R600 Localized edema: Secondary | ICD-10-CM | POA: Diagnosis not present

## 2019-12-11 DIAGNOSIS — I10 Essential (primary) hypertension: Secondary | ICD-10-CM | POA: Diagnosis not present

## 2019-12-11 DIAGNOSIS — T50B95D Adverse effect of other viral vaccines, subsequent encounter: Secondary | ICD-10-CM | POA: Diagnosis not present

## 2019-12-11 DIAGNOSIS — Z7901 Long term (current) use of anticoagulants: Secondary | ICD-10-CM | POA: Diagnosis not present

## 2019-12-11 DIAGNOSIS — D649 Anemia, unspecified: Secondary | ICD-10-CM | POA: Diagnosis not present

## 2019-12-11 DIAGNOSIS — Z4800 Encounter for change or removal of nonsurgical wound dressing: Secondary | ICD-10-CM | POA: Diagnosis not present

## 2019-12-11 DIAGNOSIS — E559 Vitamin D deficiency, unspecified: Secondary | ICD-10-CM | POA: Diagnosis not present

## 2019-12-11 DIAGNOSIS — E782 Mixed hyperlipidemia: Secondary | ICD-10-CM | POA: Diagnosis not present

## 2019-12-11 DIAGNOSIS — I872 Venous insufficiency (chronic) (peripheral): Secondary | ICD-10-CM | POA: Diagnosis not present

## 2019-12-11 DIAGNOSIS — Z7984 Long term (current) use of oral hypoglycemic drugs: Secondary | ICD-10-CM | POA: Diagnosis not present

## 2019-12-11 DIAGNOSIS — E538 Deficiency of other specified B group vitamins: Secondary | ICD-10-CM | POA: Diagnosis not present

## 2019-12-11 DIAGNOSIS — E1151 Type 2 diabetes mellitus with diabetic peripheral angiopathy without gangrene: Secondary | ICD-10-CM | POA: Diagnosis not present

## 2019-12-11 DIAGNOSIS — M1711 Unilateral primary osteoarthritis, right knee: Secondary | ICD-10-CM | POA: Diagnosis not present

## 2019-12-11 DIAGNOSIS — L03818 Cellulitis of other sites: Secondary | ICD-10-CM | POA: Diagnosis not present

## 2019-12-11 DIAGNOSIS — I480 Paroxysmal atrial fibrillation: Secondary | ICD-10-CM | POA: Diagnosis not present

## 2019-12-12 DIAGNOSIS — Z7901 Long term (current) use of anticoagulants: Secondary | ICD-10-CM | POA: Diagnosis not present

## 2019-12-12 DIAGNOSIS — I872 Venous insufficiency (chronic) (peripheral): Secondary | ICD-10-CM | POA: Diagnosis not present

## 2019-12-12 DIAGNOSIS — T50B95D Adverse effect of other viral vaccines, subsequent encounter: Secondary | ICD-10-CM | POA: Diagnosis not present

## 2019-12-12 DIAGNOSIS — Z7984 Long term (current) use of oral hypoglycemic drugs: Secondary | ICD-10-CM | POA: Diagnosis not present

## 2019-12-12 DIAGNOSIS — M1711 Unilateral primary osteoarthritis, right knee: Secondary | ICD-10-CM | POA: Diagnosis not present

## 2019-12-12 DIAGNOSIS — E782 Mixed hyperlipidemia: Secondary | ICD-10-CM | POA: Diagnosis not present

## 2019-12-12 DIAGNOSIS — Z4800 Encounter for change or removal of nonsurgical wound dressing: Secondary | ICD-10-CM | POA: Diagnosis not present

## 2019-12-12 DIAGNOSIS — I480 Paroxysmal atrial fibrillation: Secondary | ICD-10-CM | POA: Diagnosis not present

## 2019-12-12 DIAGNOSIS — L97811 Non-pressure chronic ulcer of other part of right lower leg limited to breakdown of skin: Secondary | ICD-10-CM | POA: Diagnosis not present

## 2019-12-12 DIAGNOSIS — E1151 Type 2 diabetes mellitus with diabetic peripheral angiopathy without gangrene: Secondary | ICD-10-CM | POA: Diagnosis not present

## 2019-12-12 DIAGNOSIS — E559 Vitamin D deficiency, unspecified: Secondary | ICD-10-CM | POA: Diagnosis not present

## 2019-12-12 DIAGNOSIS — E538 Deficiency of other specified B group vitamins: Secondary | ICD-10-CM | POA: Diagnosis not present

## 2019-12-12 DIAGNOSIS — I10 Essential (primary) hypertension: Secondary | ICD-10-CM | POA: Diagnosis not present

## 2019-12-12 DIAGNOSIS — D649 Anemia, unspecified: Secondary | ICD-10-CM | POA: Diagnosis not present

## 2019-12-13 DIAGNOSIS — I872 Venous insufficiency (chronic) (peripheral): Secondary | ICD-10-CM | POA: Diagnosis not present

## 2019-12-13 DIAGNOSIS — Z7984 Long term (current) use of oral hypoglycemic drugs: Secondary | ICD-10-CM | POA: Diagnosis not present

## 2019-12-13 DIAGNOSIS — I10 Essential (primary) hypertension: Secondary | ICD-10-CM | POA: Diagnosis not present

## 2019-12-13 DIAGNOSIS — E559 Vitamin D deficiency, unspecified: Secondary | ICD-10-CM | POA: Diagnosis not present

## 2019-12-13 DIAGNOSIS — E782 Mixed hyperlipidemia: Secondary | ICD-10-CM | POA: Diagnosis not present

## 2019-12-13 DIAGNOSIS — M1711 Unilateral primary osteoarthritis, right knee: Secondary | ICD-10-CM | POA: Diagnosis not present

## 2019-12-13 DIAGNOSIS — E538 Deficiency of other specified B group vitamins: Secondary | ICD-10-CM | POA: Diagnosis not present

## 2019-12-13 DIAGNOSIS — Z4800 Encounter for change or removal of nonsurgical wound dressing: Secondary | ICD-10-CM | POA: Diagnosis not present

## 2019-12-13 DIAGNOSIS — Z7901 Long term (current) use of anticoagulants: Secondary | ICD-10-CM | POA: Diagnosis not present

## 2019-12-13 DIAGNOSIS — I480 Paroxysmal atrial fibrillation: Secondary | ICD-10-CM | POA: Diagnosis not present

## 2019-12-13 DIAGNOSIS — E1151 Type 2 diabetes mellitus with diabetic peripheral angiopathy without gangrene: Secondary | ICD-10-CM | POA: Diagnosis not present

## 2019-12-13 DIAGNOSIS — L97811 Non-pressure chronic ulcer of other part of right lower leg limited to breakdown of skin: Secondary | ICD-10-CM | POA: Diagnosis not present

## 2019-12-13 DIAGNOSIS — D649 Anemia, unspecified: Secondary | ICD-10-CM | POA: Diagnosis not present

## 2019-12-13 DIAGNOSIS — T50B95D Adverse effect of other viral vaccines, subsequent encounter: Secondary | ICD-10-CM | POA: Diagnosis not present

## 2019-12-15 DIAGNOSIS — E538 Deficiency of other specified B group vitamins: Secondary | ICD-10-CM | POA: Diagnosis not present

## 2019-12-15 DIAGNOSIS — I480 Paroxysmal atrial fibrillation: Secondary | ICD-10-CM | POA: Diagnosis not present

## 2019-12-15 DIAGNOSIS — Z4800 Encounter for change or removal of nonsurgical wound dressing: Secondary | ICD-10-CM | POA: Diagnosis not present

## 2019-12-15 DIAGNOSIS — T50B95D Adverse effect of other viral vaccines, subsequent encounter: Secondary | ICD-10-CM | POA: Diagnosis not present

## 2019-12-15 DIAGNOSIS — I10 Essential (primary) hypertension: Secondary | ICD-10-CM | POA: Diagnosis not present

## 2019-12-15 DIAGNOSIS — E559 Vitamin D deficiency, unspecified: Secondary | ICD-10-CM | POA: Diagnosis not present

## 2019-12-15 DIAGNOSIS — Z7901 Long term (current) use of anticoagulants: Secondary | ICD-10-CM | POA: Diagnosis not present

## 2019-12-15 DIAGNOSIS — I872 Venous insufficiency (chronic) (peripheral): Secondary | ICD-10-CM | POA: Diagnosis not present

## 2019-12-15 DIAGNOSIS — E782 Mixed hyperlipidemia: Secondary | ICD-10-CM | POA: Diagnosis not present

## 2019-12-15 DIAGNOSIS — E1151 Type 2 diabetes mellitus with diabetic peripheral angiopathy without gangrene: Secondary | ICD-10-CM | POA: Diagnosis not present

## 2019-12-15 DIAGNOSIS — D649 Anemia, unspecified: Secondary | ICD-10-CM | POA: Diagnosis not present

## 2019-12-15 DIAGNOSIS — M1711 Unilateral primary osteoarthritis, right knee: Secondary | ICD-10-CM | POA: Diagnosis not present

## 2019-12-15 DIAGNOSIS — L97811 Non-pressure chronic ulcer of other part of right lower leg limited to breakdown of skin: Secondary | ICD-10-CM | POA: Diagnosis not present

## 2019-12-15 DIAGNOSIS — Z7984 Long term (current) use of oral hypoglycemic drugs: Secondary | ICD-10-CM | POA: Diagnosis not present

## 2019-12-18 DIAGNOSIS — D649 Anemia, unspecified: Secondary | ICD-10-CM | POA: Diagnosis not present

## 2019-12-18 DIAGNOSIS — I872 Venous insufficiency (chronic) (peripheral): Secondary | ICD-10-CM | POA: Diagnosis not present

## 2019-12-18 DIAGNOSIS — E1151 Type 2 diabetes mellitus with diabetic peripheral angiopathy without gangrene: Secondary | ICD-10-CM | POA: Diagnosis not present

## 2019-12-18 DIAGNOSIS — Z7901 Long term (current) use of anticoagulants: Secondary | ICD-10-CM | POA: Diagnosis not present

## 2019-12-18 DIAGNOSIS — Z4800 Encounter for change or removal of nonsurgical wound dressing: Secondary | ICD-10-CM | POA: Diagnosis not present

## 2019-12-18 DIAGNOSIS — E559 Vitamin D deficiency, unspecified: Secondary | ICD-10-CM | POA: Diagnosis not present

## 2019-12-18 DIAGNOSIS — Z7984 Long term (current) use of oral hypoglycemic drugs: Secondary | ICD-10-CM | POA: Diagnosis not present

## 2019-12-18 DIAGNOSIS — E538 Deficiency of other specified B group vitamins: Secondary | ICD-10-CM | POA: Diagnosis not present

## 2019-12-18 DIAGNOSIS — T50B95D Adverse effect of other viral vaccines, subsequent encounter: Secondary | ICD-10-CM | POA: Diagnosis not present

## 2019-12-18 DIAGNOSIS — M1711 Unilateral primary osteoarthritis, right knee: Secondary | ICD-10-CM | POA: Diagnosis not present

## 2019-12-18 DIAGNOSIS — E782 Mixed hyperlipidemia: Secondary | ICD-10-CM | POA: Diagnosis not present

## 2019-12-18 DIAGNOSIS — I480 Paroxysmal atrial fibrillation: Secondary | ICD-10-CM | POA: Diagnosis not present

## 2019-12-18 DIAGNOSIS — I10 Essential (primary) hypertension: Secondary | ICD-10-CM | POA: Diagnosis not present

## 2019-12-18 DIAGNOSIS — L97811 Non-pressure chronic ulcer of other part of right lower leg limited to breakdown of skin: Secondary | ICD-10-CM | POA: Diagnosis not present

## 2019-12-19 DIAGNOSIS — E782 Mixed hyperlipidemia: Secondary | ICD-10-CM | POA: Diagnosis not present

## 2019-12-19 DIAGNOSIS — I10 Essential (primary) hypertension: Secondary | ICD-10-CM | POA: Diagnosis not present

## 2019-12-19 DIAGNOSIS — M1711 Unilateral primary osteoarthritis, right knee: Secondary | ICD-10-CM | POA: Diagnosis not present

## 2019-12-19 DIAGNOSIS — I480 Paroxysmal atrial fibrillation: Secondary | ICD-10-CM | POA: Diagnosis not present

## 2019-12-19 DIAGNOSIS — E559 Vitamin D deficiency, unspecified: Secondary | ICD-10-CM | POA: Diagnosis not present

## 2019-12-19 DIAGNOSIS — T50B95D Adverse effect of other viral vaccines, subsequent encounter: Secondary | ICD-10-CM | POA: Diagnosis not present

## 2019-12-19 DIAGNOSIS — E1151 Type 2 diabetes mellitus with diabetic peripheral angiopathy without gangrene: Secondary | ICD-10-CM | POA: Diagnosis not present

## 2019-12-19 DIAGNOSIS — Z7984 Long term (current) use of oral hypoglycemic drugs: Secondary | ICD-10-CM | POA: Diagnosis not present

## 2019-12-19 DIAGNOSIS — E538 Deficiency of other specified B group vitamins: Secondary | ICD-10-CM | POA: Diagnosis not present

## 2019-12-19 DIAGNOSIS — D649 Anemia, unspecified: Secondary | ICD-10-CM | POA: Diagnosis not present

## 2019-12-19 DIAGNOSIS — I872 Venous insufficiency (chronic) (peripheral): Secondary | ICD-10-CM | POA: Diagnosis not present

## 2019-12-19 DIAGNOSIS — Z4800 Encounter for change or removal of nonsurgical wound dressing: Secondary | ICD-10-CM | POA: Diagnosis not present

## 2019-12-19 DIAGNOSIS — L97811 Non-pressure chronic ulcer of other part of right lower leg limited to breakdown of skin: Secondary | ICD-10-CM | POA: Diagnosis not present

## 2019-12-19 DIAGNOSIS — Z7901 Long term (current) use of anticoagulants: Secondary | ICD-10-CM | POA: Diagnosis not present

## 2019-12-20 DIAGNOSIS — D649 Anemia, unspecified: Secondary | ICD-10-CM | POA: Diagnosis not present

## 2019-12-20 DIAGNOSIS — Z4800 Encounter for change or removal of nonsurgical wound dressing: Secondary | ICD-10-CM | POA: Diagnosis not present

## 2019-12-20 DIAGNOSIS — E559 Vitamin D deficiency, unspecified: Secondary | ICD-10-CM | POA: Diagnosis not present

## 2019-12-20 DIAGNOSIS — T50B95D Adverse effect of other viral vaccines, subsequent encounter: Secondary | ICD-10-CM | POA: Diagnosis not present

## 2019-12-20 DIAGNOSIS — I10 Essential (primary) hypertension: Secondary | ICD-10-CM | POA: Diagnosis not present

## 2019-12-20 DIAGNOSIS — I872 Venous insufficiency (chronic) (peripheral): Secondary | ICD-10-CM | POA: Diagnosis not present

## 2019-12-20 DIAGNOSIS — E1151 Type 2 diabetes mellitus with diabetic peripheral angiopathy without gangrene: Secondary | ICD-10-CM | POA: Diagnosis not present

## 2019-12-20 DIAGNOSIS — I480 Paroxysmal atrial fibrillation: Secondary | ICD-10-CM | POA: Diagnosis not present

## 2019-12-20 DIAGNOSIS — E538 Deficiency of other specified B group vitamins: Secondary | ICD-10-CM | POA: Diagnosis not present

## 2019-12-20 DIAGNOSIS — L97811 Non-pressure chronic ulcer of other part of right lower leg limited to breakdown of skin: Secondary | ICD-10-CM | POA: Diagnosis not present

## 2019-12-20 DIAGNOSIS — Z7901 Long term (current) use of anticoagulants: Secondary | ICD-10-CM | POA: Diagnosis not present

## 2019-12-20 DIAGNOSIS — E782 Mixed hyperlipidemia: Secondary | ICD-10-CM | POA: Diagnosis not present

## 2019-12-20 DIAGNOSIS — Z7984 Long term (current) use of oral hypoglycemic drugs: Secondary | ICD-10-CM | POA: Diagnosis not present

## 2019-12-20 DIAGNOSIS — M1711 Unilateral primary osteoarthritis, right knee: Secondary | ICD-10-CM | POA: Diagnosis not present

## 2019-12-24 DIAGNOSIS — M1711 Unilateral primary osteoarthritis, right knee: Secondary | ICD-10-CM | POA: Diagnosis not present

## 2019-12-24 DIAGNOSIS — E1151 Type 2 diabetes mellitus with diabetic peripheral angiopathy without gangrene: Secondary | ICD-10-CM | POA: Diagnosis not present

## 2019-12-24 DIAGNOSIS — Z4800 Encounter for change or removal of nonsurgical wound dressing: Secondary | ICD-10-CM | POA: Diagnosis not present

## 2019-12-24 DIAGNOSIS — E559 Vitamin D deficiency, unspecified: Secondary | ICD-10-CM | POA: Diagnosis not present

## 2019-12-24 DIAGNOSIS — Z7901 Long term (current) use of anticoagulants: Secondary | ICD-10-CM | POA: Diagnosis not present

## 2019-12-24 DIAGNOSIS — L97811 Non-pressure chronic ulcer of other part of right lower leg limited to breakdown of skin: Secondary | ICD-10-CM | POA: Diagnosis not present

## 2019-12-24 DIAGNOSIS — I480 Paroxysmal atrial fibrillation: Secondary | ICD-10-CM | POA: Diagnosis not present

## 2019-12-24 DIAGNOSIS — I872 Venous insufficiency (chronic) (peripheral): Secondary | ICD-10-CM | POA: Diagnosis not present

## 2019-12-24 DIAGNOSIS — T50B95D Adverse effect of other viral vaccines, subsequent encounter: Secondary | ICD-10-CM | POA: Diagnosis not present

## 2019-12-24 DIAGNOSIS — E782 Mixed hyperlipidemia: Secondary | ICD-10-CM | POA: Diagnosis not present

## 2019-12-24 DIAGNOSIS — I10 Essential (primary) hypertension: Secondary | ICD-10-CM | POA: Diagnosis not present

## 2019-12-24 DIAGNOSIS — D649 Anemia, unspecified: Secondary | ICD-10-CM | POA: Diagnosis not present

## 2019-12-24 DIAGNOSIS — E538 Deficiency of other specified B group vitamins: Secondary | ICD-10-CM | POA: Diagnosis not present

## 2019-12-24 DIAGNOSIS — Z7984 Long term (current) use of oral hypoglycemic drugs: Secondary | ICD-10-CM | POA: Diagnosis not present

## 2019-12-25 DIAGNOSIS — R569 Unspecified convulsions: Secondary | ICD-10-CM | POA: Diagnosis not present

## 2019-12-26 DIAGNOSIS — I872 Venous insufficiency (chronic) (peripheral): Secondary | ICD-10-CM | POA: Diagnosis not present

## 2019-12-26 DIAGNOSIS — Z4800 Encounter for change or removal of nonsurgical wound dressing: Secondary | ICD-10-CM | POA: Diagnosis not present

## 2019-12-26 DIAGNOSIS — Z7901 Long term (current) use of anticoagulants: Secondary | ICD-10-CM | POA: Diagnosis not present

## 2019-12-26 DIAGNOSIS — I480 Paroxysmal atrial fibrillation: Secondary | ICD-10-CM | POA: Diagnosis not present

## 2019-12-26 DIAGNOSIS — E782 Mixed hyperlipidemia: Secondary | ICD-10-CM | POA: Diagnosis not present

## 2019-12-26 DIAGNOSIS — D649 Anemia, unspecified: Secondary | ICD-10-CM | POA: Diagnosis not present

## 2019-12-26 DIAGNOSIS — E559 Vitamin D deficiency, unspecified: Secondary | ICD-10-CM | POA: Diagnosis not present

## 2019-12-26 DIAGNOSIS — I10 Essential (primary) hypertension: Secondary | ICD-10-CM | POA: Diagnosis not present

## 2019-12-26 DIAGNOSIS — E538 Deficiency of other specified B group vitamins: Secondary | ICD-10-CM | POA: Diagnosis not present

## 2019-12-26 DIAGNOSIS — E1151 Type 2 diabetes mellitus with diabetic peripheral angiopathy without gangrene: Secondary | ICD-10-CM | POA: Diagnosis not present

## 2019-12-26 DIAGNOSIS — Z7984 Long term (current) use of oral hypoglycemic drugs: Secondary | ICD-10-CM | POA: Diagnosis not present

## 2019-12-26 DIAGNOSIS — L97811 Non-pressure chronic ulcer of other part of right lower leg limited to breakdown of skin: Secondary | ICD-10-CM | POA: Diagnosis not present

## 2019-12-26 DIAGNOSIS — M1711 Unilateral primary osteoarthritis, right knee: Secondary | ICD-10-CM | POA: Diagnosis not present

## 2019-12-26 DIAGNOSIS — T50B95D Adverse effect of other viral vaccines, subsequent encounter: Secondary | ICD-10-CM | POA: Diagnosis not present

## 2019-12-30 DIAGNOSIS — I872 Venous insufficiency (chronic) (peripheral): Secondary | ICD-10-CM | POA: Diagnosis not present

## 2020-01-01 DIAGNOSIS — T50B95D Adverse effect of other viral vaccines, subsequent encounter: Secondary | ICD-10-CM | POA: Diagnosis not present

## 2020-01-01 DIAGNOSIS — Z7984 Long term (current) use of oral hypoglycemic drugs: Secondary | ICD-10-CM | POA: Diagnosis not present

## 2020-01-01 DIAGNOSIS — E538 Deficiency of other specified B group vitamins: Secondary | ICD-10-CM | POA: Diagnosis not present

## 2020-01-01 DIAGNOSIS — Z4800 Encounter for change or removal of nonsurgical wound dressing: Secondary | ICD-10-CM | POA: Diagnosis not present

## 2020-01-01 DIAGNOSIS — M1711 Unilateral primary osteoarthritis, right knee: Secondary | ICD-10-CM | POA: Diagnosis not present

## 2020-01-01 DIAGNOSIS — I480 Paroxysmal atrial fibrillation: Secondary | ICD-10-CM | POA: Diagnosis not present

## 2020-01-01 DIAGNOSIS — E559 Vitamin D deficiency, unspecified: Secondary | ICD-10-CM | POA: Diagnosis not present

## 2020-01-01 DIAGNOSIS — L97811 Non-pressure chronic ulcer of other part of right lower leg limited to breakdown of skin: Secondary | ICD-10-CM | POA: Diagnosis not present

## 2020-01-01 DIAGNOSIS — I872 Venous insufficiency (chronic) (peripheral): Secondary | ICD-10-CM | POA: Diagnosis not present

## 2020-01-01 DIAGNOSIS — E1151 Type 2 diabetes mellitus with diabetic peripheral angiopathy without gangrene: Secondary | ICD-10-CM | POA: Diagnosis not present

## 2020-01-01 DIAGNOSIS — I10 Essential (primary) hypertension: Secondary | ICD-10-CM | POA: Diagnosis not present

## 2020-01-01 DIAGNOSIS — E782 Mixed hyperlipidemia: Secondary | ICD-10-CM | POA: Diagnosis not present

## 2020-01-01 DIAGNOSIS — D649 Anemia, unspecified: Secondary | ICD-10-CM | POA: Diagnosis not present

## 2020-01-01 DIAGNOSIS — Z7901 Long term (current) use of anticoagulants: Secondary | ICD-10-CM | POA: Diagnosis not present

## 2020-01-03 DIAGNOSIS — E1151 Type 2 diabetes mellitus with diabetic peripheral angiopathy without gangrene: Secondary | ICD-10-CM | POA: Diagnosis not present

## 2020-01-03 DIAGNOSIS — I10 Essential (primary) hypertension: Secondary | ICD-10-CM | POA: Diagnosis not present

## 2020-01-03 DIAGNOSIS — I872 Venous insufficiency (chronic) (peripheral): Secondary | ICD-10-CM | POA: Diagnosis not present

## 2020-01-03 DIAGNOSIS — Z7901 Long term (current) use of anticoagulants: Secondary | ICD-10-CM | POA: Diagnosis not present

## 2020-01-03 DIAGNOSIS — E538 Deficiency of other specified B group vitamins: Secondary | ICD-10-CM | POA: Diagnosis not present

## 2020-01-03 DIAGNOSIS — D649 Anemia, unspecified: Secondary | ICD-10-CM | POA: Diagnosis not present

## 2020-01-03 DIAGNOSIS — E782 Mixed hyperlipidemia: Secondary | ICD-10-CM | POA: Diagnosis not present

## 2020-01-03 DIAGNOSIS — I480 Paroxysmal atrial fibrillation: Secondary | ICD-10-CM | POA: Diagnosis not present

## 2020-01-03 DIAGNOSIS — L97811 Non-pressure chronic ulcer of other part of right lower leg limited to breakdown of skin: Secondary | ICD-10-CM | POA: Diagnosis not present

## 2020-01-03 DIAGNOSIS — E559 Vitamin D deficiency, unspecified: Secondary | ICD-10-CM | POA: Diagnosis not present

## 2020-01-03 DIAGNOSIS — M1711 Unilateral primary osteoarthritis, right knee: Secondary | ICD-10-CM | POA: Diagnosis not present

## 2020-01-03 DIAGNOSIS — Z4800 Encounter for change or removal of nonsurgical wound dressing: Secondary | ICD-10-CM | POA: Diagnosis not present

## 2020-01-03 DIAGNOSIS — Z7984 Long term (current) use of oral hypoglycemic drugs: Secondary | ICD-10-CM | POA: Diagnosis not present

## 2020-01-03 DIAGNOSIS — T50B95D Adverse effect of other viral vaccines, subsequent encounter: Secondary | ICD-10-CM | POA: Diagnosis not present

## 2020-01-08 DIAGNOSIS — E1151 Type 2 diabetes mellitus with diabetic peripheral angiopathy without gangrene: Secondary | ICD-10-CM | POA: Diagnosis not present

## 2020-01-08 DIAGNOSIS — L97811 Non-pressure chronic ulcer of other part of right lower leg limited to breakdown of skin: Secondary | ICD-10-CM | POA: Diagnosis not present

## 2020-01-08 DIAGNOSIS — I872 Venous insufficiency (chronic) (peripheral): Secondary | ICD-10-CM | POA: Diagnosis not present

## 2020-01-08 DIAGNOSIS — M1711 Unilateral primary osteoarthritis, right knee: Secondary | ICD-10-CM | POA: Diagnosis not present

## 2020-01-08 DIAGNOSIS — E559 Vitamin D deficiency, unspecified: Secondary | ICD-10-CM | POA: Diagnosis not present

## 2020-01-08 DIAGNOSIS — Z7984 Long term (current) use of oral hypoglycemic drugs: Secondary | ICD-10-CM | POA: Diagnosis not present

## 2020-01-08 DIAGNOSIS — E782 Mixed hyperlipidemia: Secondary | ICD-10-CM | POA: Diagnosis not present

## 2020-01-08 DIAGNOSIS — T50B95D Adverse effect of other viral vaccines, subsequent encounter: Secondary | ICD-10-CM | POA: Diagnosis not present

## 2020-01-08 DIAGNOSIS — I10 Essential (primary) hypertension: Secondary | ICD-10-CM | POA: Diagnosis not present

## 2020-01-08 DIAGNOSIS — E538 Deficiency of other specified B group vitamins: Secondary | ICD-10-CM | POA: Diagnosis not present

## 2020-01-08 DIAGNOSIS — Z7901 Long term (current) use of anticoagulants: Secondary | ICD-10-CM | POA: Diagnosis not present

## 2020-01-08 DIAGNOSIS — Z4800 Encounter for change or removal of nonsurgical wound dressing: Secondary | ICD-10-CM | POA: Diagnosis not present

## 2020-01-08 DIAGNOSIS — I480 Paroxysmal atrial fibrillation: Secondary | ICD-10-CM | POA: Diagnosis not present

## 2020-01-08 DIAGNOSIS — D649 Anemia, unspecified: Secondary | ICD-10-CM | POA: Diagnosis not present

## 2020-01-09 DIAGNOSIS — L97811 Non-pressure chronic ulcer of other part of right lower leg limited to breakdown of skin: Secondary | ICD-10-CM | POA: Diagnosis not present

## 2020-01-09 DIAGNOSIS — Z4800 Encounter for change or removal of nonsurgical wound dressing: Secondary | ICD-10-CM | POA: Diagnosis not present

## 2020-01-09 DIAGNOSIS — E1151 Type 2 diabetes mellitus with diabetic peripheral angiopathy without gangrene: Secondary | ICD-10-CM | POA: Diagnosis not present

## 2020-01-09 DIAGNOSIS — I10 Essential (primary) hypertension: Secondary | ICD-10-CM | POA: Diagnosis not present

## 2020-01-09 DIAGNOSIS — Z7901 Long term (current) use of anticoagulants: Secondary | ICD-10-CM | POA: Diagnosis not present

## 2020-01-09 DIAGNOSIS — M1711 Unilateral primary osteoarthritis, right knee: Secondary | ICD-10-CM | POA: Diagnosis not present

## 2020-01-09 DIAGNOSIS — E782 Mixed hyperlipidemia: Secondary | ICD-10-CM | POA: Diagnosis not present

## 2020-01-09 DIAGNOSIS — Z7984 Long term (current) use of oral hypoglycemic drugs: Secondary | ICD-10-CM | POA: Diagnosis not present

## 2020-01-09 DIAGNOSIS — I872 Venous insufficiency (chronic) (peripheral): Secondary | ICD-10-CM | POA: Diagnosis not present

## 2020-01-09 DIAGNOSIS — T50B95D Adverse effect of other viral vaccines, subsequent encounter: Secondary | ICD-10-CM | POA: Diagnosis not present

## 2020-01-09 DIAGNOSIS — I480 Paroxysmal atrial fibrillation: Secondary | ICD-10-CM | POA: Diagnosis not present

## 2020-01-09 DIAGNOSIS — E538 Deficiency of other specified B group vitamins: Secondary | ICD-10-CM | POA: Diagnosis not present

## 2020-01-09 DIAGNOSIS — D649 Anemia, unspecified: Secondary | ICD-10-CM | POA: Diagnosis not present

## 2020-01-09 DIAGNOSIS — E559 Vitamin D deficiency, unspecified: Secondary | ICD-10-CM | POA: Diagnosis not present

## 2020-01-10 DIAGNOSIS — E782 Mixed hyperlipidemia: Secondary | ICD-10-CM | POA: Diagnosis not present

## 2020-01-10 DIAGNOSIS — D649 Anemia, unspecified: Secondary | ICD-10-CM | POA: Diagnosis not present

## 2020-01-10 DIAGNOSIS — I872 Venous insufficiency (chronic) (peripheral): Secondary | ICD-10-CM | POA: Diagnosis not present

## 2020-01-10 DIAGNOSIS — E538 Deficiency of other specified B group vitamins: Secondary | ICD-10-CM | POA: Diagnosis not present

## 2020-01-10 DIAGNOSIS — I480 Paroxysmal atrial fibrillation: Secondary | ICD-10-CM | POA: Diagnosis not present

## 2020-01-10 DIAGNOSIS — M1711 Unilateral primary osteoarthritis, right knee: Secondary | ICD-10-CM | POA: Diagnosis not present

## 2020-01-10 DIAGNOSIS — I10 Essential (primary) hypertension: Secondary | ICD-10-CM | POA: Diagnosis not present

## 2020-01-10 DIAGNOSIS — E559 Vitamin D deficiency, unspecified: Secondary | ICD-10-CM | POA: Diagnosis not present

## 2020-01-10 DIAGNOSIS — L97811 Non-pressure chronic ulcer of other part of right lower leg limited to breakdown of skin: Secondary | ICD-10-CM | POA: Diagnosis not present

## 2020-01-10 DIAGNOSIS — T50B95D Adverse effect of other viral vaccines, subsequent encounter: Secondary | ICD-10-CM | POA: Diagnosis not present

## 2020-01-10 DIAGNOSIS — Z7984 Long term (current) use of oral hypoglycemic drugs: Secondary | ICD-10-CM | POA: Diagnosis not present

## 2020-01-10 DIAGNOSIS — Z7901 Long term (current) use of anticoagulants: Secondary | ICD-10-CM | POA: Diagnosis not present

## 2020-01-10 DIAGNOSIS — E1151 Type 2 diabetes mellitus with diabetic peripheral angiopathy without gangrene: Secondary | ICD-10-CM | POA: Diagnosis not present

## 2020-01-10 DIAGNOSIS — Z4800 Encounter for change or removal of nonsurgical wound dressing: Secondary | ICD-10-CM | POA: Diagnosis not present

## 2020-01-11 DIAGNOSIS — L97811 Non-pressure chronic ulcer of other part of right lower leg limited to breakdown of skin: Secondary | ICD-10-CM | POA: Diagnosis not present

## 2020-01-11 DIAGNOSIS — I872 Venous insufficiency (chronic) (peripheral): Secondary | ICD-10-CM | POA: Diagnosis not present

## 2020-01-14 DIAGNOSIS — Z4800 Encounter for change or removal of nonsurgical wound dressing: Secondary | ICD-10-CM | POA: Diagnosis not present

## 2020-01-14 DIAGNOSIS — M1711 Unilateral primary osteoarthritis, right knee: Secondary | ICD-10-CM | POA: Diagnosis not present

## 2020-01-14 DIAGNOSIS — T50B95D Adverse effect of other viral vaccines, subsequent encounter: Secondary | ICD-10-CM | POA: Diagnosis not present

## 2020-01-14 DIAGNOSIS — Z7984 Long term (current) use of oral hypoglycemic drugs: Secondary | ICD-10-CM | POA: Diagnosis not present

## 2020-01-14 DIAGNOSIS — E559 Vitamin D deficiency, unspecified: Secondary | ICD-10-CM | POA: Diagnosis not present

## 2020-01-14 DIAGNOSIS — Z7901 Long term (current) use of anticoagulants: Secondary | ICD-10-CM | POA: Diagnosis not present

## 2020-01-14 DIAGNOSIS — E782 Mixed hyperlipidemia: Secondary | ICD-10-CM | POA: Diagnosis not present

## 2020-01-14 DIAGNOSIS — E1151 Type 2 diabetes mellitus with diabetic peripheral angiopathy without gangrene: Secondary | ICD-10-CM | POA: Diagnosis not present

## 2020-01-14 DIAGNOSIS — I480 Paroxysmal atrial fibrillation: Secondary | ICD-10-CM | POA: Diagnosis not present

## 2020-01-14 DIAGNOSIS — E538 Deficiency of other specified B group vitamins: Secondary | ICD-10-CM | POA: Diagnosis not present

## 2020-01-14 DIAGNOSIS — I872 Venous insufficiency (chronic) (peripheral): Secondary | ICD-10-CM | POA: Diagnosis not present

## 2020-01-14 DIAGNOSIS — I10 Essential (primary) hypertension: Secondary | ICD-10-CM | POA: Diagnosis not present

## 2020-01-14 DIAGNOSIS — L97811 Non-pressure chronic ulcer of other part of right lower leg limited to breakdown of skin: Secondary | ICD-10-CM | POA: Diagnosis not present

## 2020-01-14 DIAGNOSIS — D649 Anemia, unspecified: Secondary | ICD-10-CM | POA: Diagnosis not present

## 2020-01-15 DIAGNOSIS — E11622 Type 2 diabetes mellitus with other skin ulcer: Secondary | ICD-10-CM | POA: Diagnosis not present

## 2020-01-15 DIAGNOSIS — I872 Venous insufficiency (chronic) (peripheral): Secondary | ICD-10-CM | POA: Diagnosis not present

## 2020-01-15 DIAGNOSIS — R269 Unspecified abnormalities of gait and mobility: Secondary | ICD-10-CM | POA: Diagnosis not present

## 2020-01-15 DIAGNOSIS — I10 Essential (primary) hypertension: Secondary | ICD-10-CM | POA: Diagnosis not present

## 2020-01-15 DIAGNOSIS — L97811 Non-pressure chronic ulcer of other part of right lower leg limited to breakdown of skin: Secondary | ICD-10-CM | POA: Diagnosis not present

## 2020-01-15 DIAGNOSIS — Z79899 Other long term (current) drug therapy: Secondary | ICD-10-CM | POA: Diagnosis not present

## 2020-01-15 DIAGNOSIS — E119 Type 2 diabetes mellitus without complications: Secondary | ICD-10-CM | POA: Diagnosis not present

## 2020-01-15 DIAGNOSIS — R600 Localized edema: Secondary | ICD-10-CM | POA: Diagnosis not present

## 2020-01-16 DIAGNOSIS — E1151 Type 2 diabetes mellitus with diabetic peripheral angiopathy without gangrene: Secondary | ICD-10-CM | POA: Diagnosis not present

## 2020-01-16 DIAGNOSIS — E538 Deficiency of other specified B group vitamins: Secondary | ICD-10-CM | POA: Diagnosis not present

## 2020-01-16 DIAGNOSIS — E782 Mixed hyperlipidemia: Secondary | ICD-10-CM | POA: Diagnosis not present

## 2020-01-16 DIAGNOSIS — E559 Vitamin D deficiency, unspecified: Secondary | ICD-10-CM | POA: Diagnosis not present

## 2020-01-16 DIAGNOSIS — M1711 Unilateral primary osteoarthritis, right knee: Secondary | ICD-10-CM | POA: Diagnosis not present

## 2020-01-16 DIAGNOSIS — T50B95D Adverse effect of other viral vaccines, subsequent encounter: Secondary | ICD-10-CM | POA: Diagnosis not present

## 2020-01-16 DIAGNOSIS — D649 Anemia, unspecified: Secondary | ICD-10-CM | POA: Diagnosis not present

## 2020-01-16 DIAGNOSIS — Z7901 Long term (current) use of anticoagulants: Secondary | ICD-10-CM | POA: Diagnosis not present

## 2020-01-16 DIAGNOSIS — L97811 Non-pressure chronic ulcer of other part of right lower leg limited to breakdown of skin: Secondary | ICD-10-CM | POA: Diagnosis not present

## 2020-01-16 DIAGNOSIS — Z7984 Long term (current) use of oral hypoglycemic drugs: Secondary | ICD-10-CM | POA: Diagnosis not present

## 2020-01-16 DIAGNOSIS — I10 Essential (primary) hypertension: Secondary | ICD-10-CM | POA: Diagnosis not present

## 2020-01-16 DIAGNOSIS — I480 Paroxysmal atrial fibrillation: Secondary | ICD-10-CM | POA: Diagnosis not present

## 2020-01-16 DIAGNOSIS — I872 Venous insufficiency (chronic) (peripheral): Secondary | ICD-10-CM | POA: Diagnosis not present

## 2020-01-16 DIAGNOSIS — Z4800 Encounter for change or removal of nonsurgical wound dressing: Secondary | ICD-10-CM | POA: Diagnosis not present

## 2020-01-18 DIAGNOSIS — I480 Paroxysmal atrial fibrillation: Secondary | ICD-10-CM | POA: Diagnosis not present

## 2020-01-18 DIAGNOSIS — T50B95D Adverse effect of other viral vaccines, subsequent encounter: Secondary | ICD-10-CM | POA: Diagnosis not present

## 2020-01-18 DIAGNOSIS — D649 Anemia, unspecified: Secondary | ICD-10-CM | POA: Diagnosis not present

## 2020-01-18 DIAGNOSIS — I872 Venous insufficiency (chronic) (peripheral): Secondary | ICD-10-CM | POA: Diagnosis not present

## 2020-01-18 DIAGNOSIS — E782 Mixed hyperlipidemia: Secondary | ICD-10-CM | POA: Diagnosis not present

## 2020-01-18 DIAGNOSIS — E1151 Type 2 diabetes mellitus with diabetic peripheral angiopathy without gangrene: Secondary | ICD-10-CM | POA: Diagnosis not present

## 2020-01-18 DIAGNOSIS — L97811 Non-pressure chronic ulcer of other part of right lower leg limited to breakdown of skin: Secondary | ICD-10-CM | POA: Diagnosis not present

## 2020-01-18 DIAGNOSIS — Z7984 Long term (current) use of oral hypoglycemic drugs: Secondary | ICD-10-CM | POA: Diagnosis not present

## 2020-01-18 DIAGNOSIS — M1711 Unilateral primary osteoarthritis, right knee: Secondary | ICD-10-CM | POA: Diagnosis not present

## 2020-01-18 DIAGNOSIS — Z4800 Encounter for change or removal of nonsurgical wound dressing: Secondary | ICD-10-CM | POA: Diagnosis not present

## 2020-01-18 DIAGNOSIS — I10 Essential (primary) hypertension: Secondary | ICD-10-CM | POA: Diagnosis not present

## 2020-01-18 DIAGNOSIS — E538 Deficiency of other specified B group vitamins: Secondary | ICD-10-CM | POA: Diagnosis not present

## 2020-01-18 DIAGNOSIS — E559 Vitamin D deficiency, unspecified: Secondary | ICD-10-CM | POA: Diagnosis not present

## 2020-01-18 DIAGNOSIS — Z7901 Long term (current) use of anticoagulants: Secondary | ICD-10-CM | POA: Diagnosis not present

## 2020-01-21 DIAGNOSIS — L97811 Non-pressure chronic ulcer of other part of right lower leg limited to breakdown of skin: Secondary | ICD-10-CM | POA: Diagnosis not present

## 2020-01-21 DIAGNOSIS — E1151 Type 2 diabetes mellitus with diabetic peripheral angiopathy without gangrene: Secondary | ICD-10-CM | POA: Diagnosis not present

## 2020-01-21 DIAGNOSIS — I872 Venous insufficiency (chronic) (peripheral): Secondary | ICD-10-CM | POA: Diagnosis not present

## 2020-01-23 DIAGNOSIS — I480 Paroxysmal atrial fibrillation: Secondary | ICD-10-CM | POA: Diagnosis not present

## 2020-01-23 DIAGNOSIS — D649 Anemia, unspecified: Secondary | ICD-10-CM | POA: Diagnosis not present

## 2020-01-23 DIAGNOSIS — Z7901 Long term (current) use of anticoagulants: Secondary | ICD-10-CM | POA: Diagnosis not present

## 2020-01-23 DIAGNOSIS — M1711 Unilateral primary osteoarthritis, right knee: Secondary | ICD-10-CM | POA: Diagnosis not present

## 2020-01-23 DIAGNOSIS — T50B95D Adverse effect of other viral vaccines, subsequent encounter: Secondary | ICD-10-CM | POA: Diagnosis not present

## 2020-01-23 DIAGNOSIS — E538 Deficiency of other specified B group vitamins: Secondary | ICD-10-CM | POA: Diagnosis not present

## 2020-01-23 DIAGNOSIS — L97811 Non-pressure chronic ulcer of other part of right lower leg limited to breakdown of skin: Secondary | ICD-10-CM | POA: Diagnosis not present

## 2020-01-23 DIAGNOSIS — E782 Mixed hyperlipidemia: Secondary | ICD-10-CM | POA: Diagnosis not present

## 2020-01-23 DIAGNOSIS — I10 Essential (primary) hypertension: Secondary | ICD-10-CM | POA: Diagnosis not present

## 2020-01-23 DIAGNOSIS — E1151 Type 2 diabetes mellitus with diabetic peripheral angiopathy without gangrene: Secondary | ICD-10-CM | POA: Diagnosis not present

## 2020-01-23 DIAGNOSIS — Z7984 Long term (current) use of oral hypoglycemic drugs: Secondary | ICD-10-CM | POA: Diagnosis not present

## 2020-01-23 DIAGNOSIS — Z4800 Encounter for change or removal of nonsurgical wound dressing: Secondary | ICD-10-CM | POA: Diagnosis not present

## 2020-01-23 DIAGNOSIS — E559 Vitamin D deficiency, unspecified: Secondary | ICD-10-CM | POA: Diagnosis not present

## 2020-01-23 DIAGNOSIS — I872 Venous insufficiency (chronic) (peripheral): Secondary | ICD-10-CM | POA: Diagnosis not present

## 2020-01-24 DIAGNOSIS — T50B95D Adverse effect of other viral vaccines, subsequent encounter: Secondary | ICD-10-CM | POA: Diagnosis not present

## 2020-01-24 DIAGNOSIS — D649 Anemia, unspecified: Secondary | ICD-10-CM | POA: Diagnosis not present

## 2020-01-24 DIAGNOSIS — Z7901 Long term (current) use of anticoagulants: Secondary | ICD-10-CM | POA: Diagnosis not present

## 2020-01-24 DIAGNOSIS — L97811 Non-pressure chronic ulcer of other part of right lower leg limited to breakdown of skin: Secondary | ICD-10-CM | POA: Diagnosis not present

## 2020-01-24 DIAGNOSIS — Z7984 Long term (current) use of oral hypoglycemic drugs: Secondary | ICD-10-CM | POA: Diagnosis not present

## 2020-01-24 DIAGNOSIS — I480 Paroxysmal atrial fibrillation: Secondary | ICD-10-CM | POA: Diagnosis not present

## 2020-01-24 DIAGNOSIS — Z4800 Encounter for change or removal of nonsurgical wound dressing: Secondary | ICD-10-CM | POA: Diagnosis not present

## 2020-01-24 DIAGNOSIS — I872 Venous insufficiency (chronic) (peripheral): Secondary | ICD-10-CM | POA: Diagnosis not present

## 2020-01-24 DIAGNOSIS — I10 Essential (primary) hypertension: Secondary | ICD-10-CM | POA: Diagnosis not present

## 2020-01-24 DIAGNOSIS — E1151 Type 2 diabetes mellitus with diabetic peripheral angiopathy without gangrene: Secondary | ICD-10-CM | POA: Diagnosis not present

## 2020-01-24 DIAGNOSIS — M1711 Unilateral primary osteoarthritis, right knee: Secondary | ICD-10-CM | POA: Diagnosis not present

## 2020-01-24 DIAGNOSIS — E559 Vitamin D deficiency, unspecified: Secondary | ICD-10-CM | POA: Diagnosis not present

## 2020-01-24 DIAGNOSIS — E538 Deficiency of other specified B group vitamins: Secondary | ICD-10-CM | POA: Diagnosis not present

## 2020-01-24 DIAGNOSIS — E782 Mixed hyperlipidemia: Secondary | ICD-10-CM | POA: Diagnosis not present

## 2020-01-28 DIAGNOSIS — I480 Paroxysmal atrial fibrillation: Secondary | ICD-10-CM | POA: Diagnosis not present

## 2020-01-28 DIAGNOSIS — I10 Essential (primary) hypertension: Secondary | ICD-10-CM | POA: Diagnosis not present

## 2020-01-28 DIAGNOSIS — T50B95D Adverse effect of other viral vaccines, subsequent encounter: Secondary | ICD-10-CM | POA: Diagnosis not present

## 2020-01-28 DIAGNOSIS — E559 Vitamin D deficiency, unspecified: Secondary | ICD-10-CM | POA: Diagnosis not present

## 2020-01-28 DIAGNOSIS — Z7901 Long term (current) use of anticoagulants: Secondary | ICD-10-CM | POA: Diagnosis not present

## 2020-01-28 DIAGNOSIS — D649 Anemia, unspecified: Secondary | ICD-10-CM | POA: Diagnosis not present

## 2020-01-28 DIAGNOSIS — L97811 Non-pressure chronic ulcer of other part of right lower leg limited to breakdown of skin: Secondary | ICD-10-CM | POA: Diagnosis not present

## 2020-01-28 DIAGNOSIS — Z4800 Encounter for change or removal of nonsurgical wound dressing: Secondary | ICD-10-CM | POA: Diagnosis not present

## 2020-01-28 DIAGNOSIS — Z7984 Long term (current) use of oral hypoglycemic drugs: Secondary | ICD-10-CM | POA: Diagnosis not present

## 2020-01-28 DIAGNOSIS — E538 Deficiency of other specified B group vitamins: Secondary | ICD-10-CM | POA: Diagnosis not present

## 2020-01-28 DIAGNOSIS — M1711 Unilateral primary osteoarthritis, right knee: Secondary | ICD-10-CM | POA: Diagnosis not present

## 2020-01-28 DIAGNOSIS — E1151 Type 2 diabetes mellitus with diabetic peripheral angiopathy without gangrene: Secondary | ICD-10-CM | POA: Diagnosis not present

## 2020-01-28 DIAGNOSIS — E782 Mixed hyperlipidemia: Secondary | ICD-10-CM | POA: Diagnosis not present

## 2020-01-28 DIAGNOSIS — I872 Venous insufficiency (chronic) (peripheral): Secondary | ICD-10-CM | POA: Diagnosis not present

## 2020-01-29 DIAGNOSIS — E538 Deficiency of other specified B group vitamins: Secondary | ICD-10-CM | POA: Diagnosis not present

## 2020-01-29 DIAGNOSIS — Z7984 Long term (current) use of oral hypoglycemic drugs: Secondary | ICD-10-CM | POA: Diagnosis not present

## 2020-01-29 DIAGNOSIS — I872 Venous insufficiency (chronic) (peripheral): Secondary | ICD-10-CM | POA: Diagnosis not present

## 2020-01-29 DIAGNOSIS — Z7901 Long term (current) use of anticoagulants: Secondary | ICD-10-CM | POA: Diagnosis not present

## 2020-01-29 DIAGNOSIS — E1151 Type 2 diabetes mellitus with diabetic peripheral angiopathy without gangrene: Secondary | ICD-10-CM | POA: Diagnosis not present

## 2020-01-29 DIAGNOSIS — M1711 Unilateral primary osteoarthritis, right knee: Secondary | ICD-10-CM | POA: Diagnosis not present

## 2020-01-29 DIAGNOSIS — I480 Paroxysmal atrial fibrillation: Secondary | ICD-10-CM | POA: Diagnosis not present

## 2020-01-29 DIAGNOSIS — I10 Essential (primary) hypertension: Secondary | ICD-10-CM | POA: Diagnosis not present

## 2020-01-29 DIAGNOSIS — T50B95D Adverse effect of other viral vaccines, subsequent encounter: Secondary | ICD-10-CM | POA: Diagnosis not present

## 2020-01-29 DIAGNOSIS — Z4800 Encounter for change or removal of nonsurgical wound dressing: Secondary | ICD-10-CM | POA: Diagnosis not present

## 2020-01-29 DIAGNOSIS — D649 Anemia, unspecified: Secondary | ICD-10-CM | POA: Diagnosis not present

## 2020-01-29 DIAGNOSIS — L97811 Non-pressure chronic ulcer of other part of right lower leg limited to breakdown of skin: Secondary | ICD-10-CM | POA: Diagnosis not present

## 2020-01-29 DIAGNOSIS — E559 Vitamin D deficiency, unspecified: Secondary | ICD-10-CM | POA: Diagnosis not present

## 2020-01-29 DIAGNOSIS — E782 Mixed hyperlipidemia: Secondary | ICD-10-CM | POA: Diagnosis not present

## 2020-01-31 DIAGNOSIS — L97811 Non-pressure chronic ulcer of other part of right lower leg limited to breakdown of skin: Secondary | ICD-10-CM | POA: Diagnosis not present

## 2020-01-31 DIAGNOSIS — E782 Mixed hyperlipidemia: Secondary | ICD-10-CM | POA: Diagnosis not present

## 2020-01-31 DIAGNOSIS — E538 Deficiency of other specified B group vitamins: Secondary | ICD-10-CM | POA: Diagnosis not present

## 2020-01-31 DIAGNOSIS — I480 Paroxysmal atrial fibrillation: Secondary | ICD-10-CM | POA: Diagnosis not present

## 2020-01-31 DIAGNOSIS — I10 Essential (primary) hypertension: Secondary | ICD-10-CM | POA: Diagnosis not present

## 2020-01-31 DIAGNOSIS — Z7901 Long term (current) use of anticoagulants: Secondary | ICD-10-CM | POA: Diagnosis not present

## 2020-01-31 DIAGNOSIS — Z4800 Encounter for change or removal of nonsurgical wound dressing: Secondary | ICD-10-CM | POA: Diagnosis not present

## 2020-01-31 DIAGNOSIS — E559 Vitamin D deficiency, unspecified: Secondary | ICD-10-CM | POA: Diagnosis not present

## 2020-01-31 DIAGNOSIS — D649 Anemia, unspecified: Secondary | ICD-10-CM | POA: Diagnosis not present

## 2020-01-31 DIAGNOSIS — I872 Venous insufficiency (chronic) (peripheral): Secondary | ICD-10-CM | POA: Diagnosis not present

## 2020-01-31 DIAGNOSIS — Z7984 Long term (current) use of oral hypoglycemic drugs: Secondary | ICD-10-CM | POA: Diagnosis not present

## 2020-01-31 DIAGNOSIS — T50B95D Adverse effect of other viral vaccines, subsequent encounter: Secondary | ICD-10-CM | POA: Diagnosis not present

## 2020-01-31 DIAGNOSIS — M1711 Unilateral primary osteoarthritis, right knee: Secondary | ICD-10-CM | POA: Diagnosis not present

## 2020-01-31 DIAGNOSIS — E1151 Type 2 diabetes mellitus with diabetic peripheral angiopathy without gangrene: Secondary | ICD-10-CM | POA: Diagnosis not present

## 2020-02-04 DIAGNOSIS — E1151 Type 2 diabetes mellitus with diabetic peripheral angiopathy without gangrene: Secondary | ICD-10-CM | POA: Diagnosis not present

## 2020-02-04 DIAGNOSIS — I10 Essential (primary) hypertension: Secondary | ICD-10-CM | POA: Diagnosis not present

## 2020-02-04 DIAGNOSIS — I872 Venous insufficiency (chronic) (peripheral): Secondary | ICD-10-CM | POA: Diagnosis not present

## 2020-02-04 DIAGNOSIS — Z7984 Long term (current) use of oral hypoglycemic drugs: Secondary | ICD-10-CM | POA: Diagnosis not present

## 2020-02-04 DIAGNOSIS — M1711 Unilateral primary osteoarthritis, right knee: Secondary | ICD-10-CM | POA: Diagnosis not present

## 2020-02-04 DIAGNOSIS — Z7901 Long term (current) use of anticoagulants: Secondary | ICD-10-CM | POA: Diagnosis not present

## 2020-02-04 DIAGNOSIS — D649 Anemia, unspecified: Secondary | ICD-10-CM | POA: Diagnosis not present

## 2020-02-04 DIAGNOSIS — E782 Mixed hyperlipidemia: Secondary | ICD-10-CM | POA: Diagnosis not present

## 2020-02-04 DIAGNOSIS — T50B95D Adverse effect of other viral vaccines, subsequent encounter: Secondary | ICD-10-CM | POA: Diagnosis not present

## 2020-02-04 DIAGNOSIS — Z4800 Encounter for change or removal of nonsurgical wound dressing: Secondary | ICD-10-CM | POA: Diagnosis not present

## 2020-02-04 DIAGNOSIS — I480 Paroxysmal atrial fibrillation: Secondary | ICD-10-CM | POA: Diagnosis not present

## 2020-02-04 DIAGNOSIS — E538 Deficiency of other specified B group vitamins: Secondary | ICD-10-CM | POA: Diagnosis not present

## 2020-02-04 DIAGNOSIS — L97811 Non-pressure chronic ulcer of other part of right lower leg limited to breakdown of skin: Secondary | ICD-10-CM | POA: Diagnosis not present

## 2020-02-04 DIAGNOSIS — E559 Vitamin D deficiency, unspecified: Secondary | ICD-10-CM | POA: Diagnosis not present

## 2020-02-05 DIAGNOSIS — I872 Venous insufficiency (chronic) (peripheral): Secondary | ICD-10-CM | POA: Diagnosis not present

## 2020-02-06 DIAGNOSIS — Z7901 Long term (current) use of anticoagulants: Secondary | ICD-10-CM | POA: Diagnosis not present

## 2020-02-06 DIAGNOSIS — L97811 Non-pressure chronic ulcer of other part of right lower leg limited to breakdown of skin: Secondary | ICD-10-CM | POA: Diagnosis not present

## 2020-02-06 DIAGNOSIS — I480 Paroxysmal atrial fibrillation: Secondary | ICD-10-CM | POA: Diagnosis not present

## 2020-02-06 DIAGNOSIS — M1711 Unilateral primary osteoarthritis, right knee: Secondary | ICD-10-CM | POA: Diagnosis not present

## 2020-02-06 DIAGNOSIS — Z7984 Long term (current) use of oral hypoglycemic drugs: Secondary | ICD-10-CM | POA: Diagnosis not present

## 2020-02-06 DIAGNOSIS — Z4800 Encounter for change or removal of nonsurgical wound dressing: Secondary | ICD-10-CM | POA: Diagnosis not present

## 2020-02-06 DIAGNOSIS — E1151 Type 2 diabetes mellitus with diabetic peripheral angiopathy without gangrene: Secondary | ICD-10-CM | POA: Diagnosis not present

## 2020-02-06 DIAGNOSIS — E782 Mixed hyperlipidemia: Secondary | ICD-10-CM | POA: Diagnosis not present

## 2020-02-06 DIAGNOSIS — D649 Anemia, unspecified: Secondary | ICD-10-CM | POA: Diagnosis not present

## 2020-02-06 DIAGNOSIS — E559 Vitamin D deficiency, unspecified: Secondary | ICD-10-CM | POA: Diagnosis not present

## 2020-02-06 DIAGNOSIS — E538 Deficiency of other specified B group vitamins: Secondary | ICD-10-CM | POA: Diagnosis not present

## 2020-02-06 DIAGNOSIS — I872 Venous insufficiency (chronic) (peripheral): Secondary | ICD-10-CM | POA: Diagnosis not present

## 2020-02-06 DIAGNOSIS — I10 Essential (primary) hypertension: Secondary | ICD-10-CM | POA: Diagnosis not present

## 2020-02-06 DIAGNOSIS — T50B95D Adverse effect of other viral vaccines, subsequent encounter: Secondary | ICD-10-CM | POA: Diagnosis not present

## 2020-02-12 DIAGNOSIS — M1711 Unilateral primary osteoarthritis, right knee: Secondary | ICD-10-CM | POA: Diagnosis not present

## 2020-02-12 DIAGNOSIS — E559 Vitamin D deficiency, unspecified: Secondary | ICD-10-CM | POA: Diagnosis not present

## 2020-02-12 DIAGNOSIS — I480 Paroxysmal atrial fibrillation: Secondary | ICD-10-CM | POA: Diagnosis not present

## 2020-02-12 DIAGNOSIS — Z4800 Encounter for change or removal of nonsurgical wound dressing: Secondary | ICD-10-CM | POA: Diagnosis not present

## 2020-02-12 DIAGNOSIS — E1151 Type 2 diabetes mellitus with diabetic peripheral angiopathy without gangrene: Secondary | ICD-10-CM | POA: Diagnosis not present

## 2020-02-12 DIAGNOSIS — E782 Mixed hyperlipidemia: Secondary | ICD-10-CM | POA: Diagnosis not present

## 2020-02-12 DIAGNOSIS — I10 Essential (primary) hypertension: Secondary | ICD-10-CM | POA: Diagnosis not present

## 2020-02-12 DIAGNOSIS — T50B95D Adverse effect of other viral vaccines, subsequent encounter: Secondary | ICD-10-CM | POA: Diagnosis not present

## 2020-02-12 DIAGNOSIS — Z7984 Long term (current) use of oral hypoglycemic drugs: Secondary | ICD-10-CM | POA: Diagnosis not present

## 2020-02-12 DIAGNOSIS — L97811 Non-pressure chronic ulcer of other part of right lower leg limited to breakdown of skin: Secondary | ICD-10-CM | POA: Diagnosis not present

## 2020-02-12 DIAGNOSIS — Z7901 Long term (current) use of anticoagulants: Secondary | ICD-10-CM | POA: Diagnosis not present

## 2020-02-12 DIAGNOSIS — I872 Venous insufficiency (chronic) (peripheral): Secondary | ICD-10-CM | POA: Diagnosis not present

## 2020-02-12 DIAGNOSIS — E538 Deficiency of other specified B group vitamins: Secondary | ICD-10-CM | POA: Diagnosis not present

## 2020-02-12 DIAGNOSIS — D649 Anemia, unspecified: Secondary | ICD-10-CM | POA: Diagnosis not present

## 2020-02-14 DIAGNOSIS — Z4800 Encounter for change or removal of nonsurgical wound dressing: Secondary | ICD-10-CM | POA: Diagnosis not present

## 2020-02-14 DIAGNOSIS — E559 Vitamin D deficiency, unspecified: Secondary | ICD-10-CM | POA: Diagnosis not present

## 2020-02-14 DIAGNOSIS — E538 Deficiency of other specified B group vitamins: Secondary | ICD-10-CM | POA: Diagnosis not present

## 2020-02-14 DIAGNOSIS — L97811 Non-pressure chronic ulcer of other part of right lower leg limited to breakdown of skin: Secondary | ICD-10-CM | POA: Diagnosis not present

## 2020-02-14 DIAGNOSIS — E1151 Type 2 diabetes mellitus with diabetic peripheral angiopathy without gangrene: Secondary | ICD-10-CM | POA: Diagnosis not present

## 2020-02-14 DIAGNOSIS — E782 Mixed hyperlipidemia: Secondary | ICD-10-CM | POA: Diagnosis not present

## 2020-02-14 DIAGNOSIS — Z7984 Long term (current) use of oral hypoglycemic drugs: Secondary | ICD-10-CM | POA: Diagnosis not present

## 2020-02-14 DIAGNOSIS — Z7901 Long term (current) use of anticoagulants: Secondary | ICD-10-CM | POA: Diagnosis not present

## 2020-02-14 DIAGNOSIS — I480 Paroxysmal atrial fibrillation: Secondary | ICD-10-CM | POA: Diagnosis not present

## 2020-02-14 DIAGNOSIS — I872 Venous insufficiency (chronic) (peripheral): Secondary | ICD-10-CM | POA: Diagnosis not present

## 2020-02-14 DIAGNOSIS — T50B95D Adverse effect of other viral vaccines, subsequent encounter: Secondary | ICD-10-CM | POA: Diagnosis not present

## 2020-02-14 DIAGNOSIS — I10 Essential (primary) hypertension: Secondary | ICD-10-CM | POA: Diagnosis not present

## 2020-02-14 DIAGNOSIS — M1711 Unilateral primary osteoarthritis, right knee: Secondary | ICD-10-CM | POA: Diagnosis not present

## 2020-02-14 DIAGNOSIS — D649 Anemia, unspecified: Secondary | ICD-10-CM | POA: Diagnosis not present

## 2020-02-19 DIAGNOSIS — I10 Essential (primary) hypertension: Secondary | ICD-10-CM | POA: Diagnosis not present

## 2020-02-19 DIAGNOSIS — I872 Venous insufficiency (chronic) (peripheral): Secondary | ICD-10-CM | POA: Diagnosis not present

## 2020-02-19 DIAGNOSIS — L97811 Non-pressure chronic ulcer of other part of right lower leg limited to breakdown of skin: Secondary | ICD-10-CM | POA: Diagnosis not present

## 2020-02-19 DIAGNOSIS — I4891 Unspecified atrial fibrillation: Secondary | ICD-10-CM | POA: Diagnosis not present

## 2020-02-19 DIAGNOSIS — Z79899 Other long term (current) drug therapy: Secondary | ICD-10-CM | POA: Diagnosis not present

## 2020-02-20 DIAGNOSIS — E559 Vitamin D deficiency, unspecified: Secondary | ICD-10-CM | POA: Diagnosis not present

## 2020-02-20 DIAGNOSIS — T50B95D Adverse effect of other viral vaccines, subsequent encounter: Secondary | ICD-10-CM | POA: Diagnosis not present

## 2020-02-20 DIAGNOSIS — E782 Mixed hyperlipidemia: Secondary | ICD-10-CM | POA: Diagnosis not present

## 2020-02-20 DIAGNOSIS — E538 Deficiency of other specified B group vitamins: Secondary | ICD-10-CM | POA: Diagnosis not present

## 2020-02-20 DIAGNOSIS — I480 Paroxysmal atrial fibrillation: Secondary | ICD-10-CM | POA: Diagnosis not present

## 2020-02-20 DIAGNOSIS — I10 Essential (primary) hypertension: Secondary | ICD-10-CM | POA: Diagnosis not present

## 2020-02-20 DIAGNOSIS — L97811 Non-pressure chronic ulcer of other part of right lower leg limited to breakdown of skin: Secondary | ICD-10-CM | POA: Diagnosis not present

## 2020-02-20 DIAGNOSIS — Z7984 Long term (current) use of oral hypoglycemic drugs: Secondary | ICD-10-CM | POA: Diagnosis not present

## 2020-02-20 DIAGNOSIS — Z4800 Encounter for change or removal of nonsurgical wound dressing: Secondary | ICD-10-CM | POA: Diagnosis not present

## 2020-02-20 DIAGNOSIS — E1151 Type 2 diabetes mellitus with diabetic peripheral angiopathy without gangrene: Secondary | ICD-10-CM | POA: Diagnosis not present

## 2020-02-20 DIAGNOSIS — Z7901 Long term (current) use of anticoagulants: Secondary | ICD-10-CM | POA: Diagnosis not present

## 2020-02-20 DIAGNOSIS — M1711 Unilateral primary osteoarthritis, right knee: Secondary | ICD-10-CM | POA: Diagnosis not present

## 2020-02-20 DIAGNOSIS — D649 Anemia, unspecified: Secondary | ICD-10-CM | POA: Diagnosis not present

## 2020-02-20 DIAGNOSIS — I872 Venous insufficiency (chronic) (peripheral): Secondary | ICD-10-CM | POA: Diagnosis not present

## 2020-02-21 DIAGNOSIS — Z4800 Encounter for change or removal of nonsurgical wound dressing: Secondary | ICD-10-CM | POA: Diagnosis not present

## 2020-02-21 DIAGNOSIS — I872 Venous insufficiency (chronic) (peripheral): Secondary | ICD-10-CM | POA: Diagnosis not present

## 2020-02-21 DIAGNOSIS — D649 Anemia, unspecified: Secondary | ICD-10-CM | POA: Diagnosis not present

## 2020-02-21 DIAGNOSIS — L97811 Non-pressure chronic ulcer of other part of right lower leg limited to breakdown of skin: Secondary | ICD-10-CM | POA: Diagnosis not present

## 2020-02-21 DIAGNOSIS — E538 Deficiency of other specified B group vitamins: Secondary | ICD-10-CM | POA: Diagnosis not present

## 2020-02-21 DIAGNOSIS — Z7901 Long term (current) use of anticoagulants: Secondary | ICD-10-CM | POA: Diagnosis not present

## 2020-02-21 DIAGNOSIS — M1711 Unilateral primary osteoarthritis, right knee: Secondary | ICD-10-CM | POA: Diagnosis not present

## 2020-02-21 DIAGNOSIS — T50B95D Adverse effect of other viral vaccines, subsequent encounter: Secondary | ICD-10-CM | POA: Diagnosis not present

## 2020-02-21 DIAGNOSIS — E1151 Type 2 diabetes mellitus with diabetic peripheral angiopathy without gangrene: Secondary | ICD-10-CM | POA: Diagnosis not present

## 2020-02-21 DIAGNOSIS — I480 Paroxysmal atrial fibrillation: Secondary | ICD-10-CM | POA: Diagnosis not present

## 2020-02-21 DIAGNOSIS — E559 Vitamin D deficiency, unspecified: Secondary | ICD-10-CM | POA: Diagnosis not present

## 2020-02-21 DIAGNOSIS — I10 Essential (primary) hypertension: Secondary | ICD-10-CM | POA: Diagnosis not present

## 2020-02-21 DIAGNOSIS — Z7984 Long term (current) use of oral hypoglycemic drugs: Secondary | ICD-10-CM | POA: Diagnosis not present

## 2020-02-21 DIAGNOSIS — E782 Mixed hyperlipidemia: Secondary | ICD-10-CM | POA: Diagnosis not present

## 2020-02-23 DIAGNOSIS — I872 Venous insufficiency (chronic) (peripheral): Secondary | ICD-10-CM | POA: Diagnosis not present

## 2020-03-04 DIAGNOSIS — L97812 Non-pressure chronic ulcer of other part of right lower leg with fat layer exposed: Secondary | ICD-10-CM | POA: Diagnosis not present

## 2020-03-04 DIAGNOSIS — I872 Venous insufficiency (chronic) (peripheral): Secondary | ICD-10-CM | POA: Diagnosis not present

## 2020-03-04 DIAGNOSIS — L97212 Non-pressure chronic ulcer of right calf with fat layer exposed: Secondary | ICD-10-CM | POA: Diagnosis not present

## 2020-03-04 DIAGNOSIS — I83012 Varicose veins of right lower extremity with ulcer of calf: Secondary | ICD-10-CM | POA: Diagnosis not present

## 2020-03-04 DIAGNOSIS — S81801A Unspecified open wound, right lower leg, initial encounter: Secondary | ICD-10-CM | POA: Diagnosis not present

## 2020-03-11 DIAGNOSIS — L84 Corns and callosities: Secondary | ICD-10-CM | POA: Diagnosis not present

## 2020-03-11 DIAGNOSIS — I83012 Varicose veins of right lower extremity with ulcer of calf: Secondary | ICD-10-CM | POA: Diagnosis not present

## 2020-03-11 DIAGNOSIS — E1151 Type 2 diabetes mellitus with diabetic peripheral angiopathy without gangrene: Secondary | ICD-10-CM | POA: Diagnosis not present

## 2020-03-11 DIAGNOSIS — I872 Venous insufficiency (chronic) (peripheral): Secondary | ICD-10-CM | POA: Diagnosis not present

## 2020-03-11 DIAGNOSIS — L97812 Non-pressure chronic ulcer of other part of right lower leg with fat layer exposed: Secondary | ICD-10-CM | POA: Diagnosis not present

## 2020-03-11 DIAGNOSIS — L97212 Non-pressure chronic ulcer of right calf with fat layer exposed: Secondary | ICD-10-CM | POA: Diagnosis not present

## 2020-03-18 DIAGNOSIS — I83012 Varicose veins of right lower extremity with ulcer of calf: Secondary | ICD-10-CM | POA: Diagnosis not present

## 2020-03-18 DIAGNOSIS — L97212 Non-pressure chronic ulcer of right calf with fat layer exposed: Secondary | ICD-10-CM | POA: Diagnosis not present

## 2020-03-18 DIAGNOSIS — L97812 Non-pressure chronic ulcer of other part of right lower leg with fat layer exposed: Secondary | ICD-10-CM | POA: Diagnosis not present

## 2020-03-18 DIAGNOSIS — I872 Venous insufficiency (chronic) (peripheral): Secondary | ICD-10-CM | POA: Diagnosis not present

## 2020-03-24 DIAGNOSIS — E119 Type 2 diabetes mellitus without complications: Secondary | ICD-10-CM | POA: Diagnosis not present

## 2020-03-25 DIAGNOSIS — L97212 Non-pressure chronic ulcer of right calf with fat layer exposed: Secondary | ICD-10-CM | POA: Diagnosis not present

## 2020-03-25 DIAGNOSIS — L97812 Non-pressure chronic ulcer of other part of right lower leg with fat layer exposed: Secondary | ICD-10-CM | POA: Diagnosis not present

## 2020-03-25 DIAGNOSIS — I83012 Varicose veins of right lower extremity with ulcer of calf: Secondary | ICD-10-CM | POA: Diagnosis not present

## 2020-03-25 DIAGNOSIS — I872 Venous insufficiency (chronic) (peripheral): Secondary | ICD-10-CM | POA: Diagnosis not present

## 2020-04-01 DIAGNOSIS — I83012 Varicose veins of right lower extremity with ulcer of calf: Secondary | ICD-10-CM | POA: Diagnosis not present

## 2020-04-01 DIAGNOSIS — R600 Localized edema: Secondary | ICD-10-CM | POA: Diagnosis not present

## 2020-04-01 DIAGNOSIS — L97812 Non-pressure chronic ulcer of other part of right lower leg with fat layer exposed: Secondary | ICD-10-CM | POA: Diagnosis not present

## 2020-04-01 DIAGNOSIS — L97811 Non-pressure chronic ulcer of other part of right lower leg limited to breakdown of skin: Secondary | ICD-10-CM | POA: Diagnosis not present

## 2020-04-01 DIAGNOSIS — L97212 Non-pressure chronic ulcer of right calf with fat layer exposed: Secondary | ICD-10-CM | POA: Diagnosis not present

## 2020-04-01 DIAGNOSIS — E11622 Type 2 diabetes mellitus with other skin ulcer: Secondary | ICD-10-CM | POA: Diagnosis not present

## 2020-04-01 DIAGNOSIS — I872 Venous insufficiency (chronic) (peripheral): Secondary | ICD-10-CM | POA: Diagnosis not present

## 2020-04-01 DIAGNOSIS — I10 Essential (primary) hypertension: Secondary | ICD-10-CM | POA: Diagnosis not present

## 2020-04-08 DIAGNOSIS — I83012 Varicose veins of right lower extremity with ulcer of calf: Secondary | ICD-10-CM | POA: Diagnosis not present

## 2020-04-08 DIAGNOSIS — L97812 Non-pressure chronic ulcer of other part of right lower leg with fat layer exposed: Secondary | ICD-10-CM | POA: Diagnosis not present

## 2020-04-08 DIAGNOSIS — I872 Venous insufficiency (chronic) (peripheral): Secondary | ICD-10-CM | POA: Diagnosis not present

## 2020-04-08 DIAGNOSIS — L97212 Non-pressure chronic ulcer of right calf with fat layer exposed: Secondary | ICD-10-CM | POA: Diagnosis not present

## 2020-04-14 DIAGNOSIS — Z794 Long term (current) use of insulin: Secondary | ICD-10-CM | POA: Diagnosis not present

## 2020-04-14 DIAGNOSIS — I872 Venous insufficiency (chronic) (peripheral): Secondary | ICD-10-CM | POA: Diagnosis not present

## 2020-04-14 DIAGNOSIS — E1151 Type 2 diabetes mellitus with diabetic peripheral angiopathy without gangrene: Secondary | ICD-10-CM | POA: Diagnosis not present

## 2020-04-14 DIAGNOSIS — I10 Essential (primary) hypertension: Secondary | ICD-10-CM | POA: Diagnosis not present

## 2020-04-14 DIAGNOSIS — I480 Paroxysmal atrial fibrillation: Secondary | ICD-10-CM | POA: Diagnosis not present

## 2020-04-14 DIAGNOSIS — M150 Primary generalized (osteo)arthritis: Secondary | ICD-10-CM | POA: Diagnosis not present

## 2020-04-15 DIAGNOSIS — I83022 Varicose veins of left lower extremity with ulcer of calf: Secondary | ICD-10-CM | POA: Diagnosis not present

## 2020-04-15 DIAGNOSIS — L97212 Non-pressure chronic ulcer of right calf with fat layer exposed: Secondary | ICD-10-CM | POA: Diagnosis not present

## 2020-04-15 DIAGNOSIS — L97221 Non-pressure chronic ulcer of left calf limited to breakdown of skin: Secondary | ICD-10-CM | POA: Diagnosis not present

## 2020-04-15 DIAGNOSIS — I83012 Varicose veins of right lower extremity with ulcer of calf: Secondary | ICD-10-CM | POA: Diagnosis not present

## 2020-04-16 DIAGNOSIS — Z794 Long term (current) use of insulin: Secondary | ICD-10-CM | POA: Diagnosis not present

## 2020-04-16 DIAGNOSIS — I872 Venous insufficiency (chronic) (peripheral): Secondary | ICD-10-CM | POA: Diagnosis not present

## 2020-04-16 DIAGNOSIS — M150 Primary generalized (osteo)arthritis: Secondary | ICD-10-CM | POA: Diagnosis not present

## 2020-04-16 DIAGNOSIS — E1151 Type 2 diabetes mellitus with diabetic peripheral angiopathy without gangrene: Secondary | ICD-10-CM | POA: Diagnosis not present

## 2020-04-16 DIAGNOSIS — I480 Paroxysmal atrial fibrillation: Secondary | ICD-10-CM | POA: Diagnosis not present

## 2020-04-16 DIAGNOSIS — I10 Essential (primary) hypertension: Secondary | ICD-10-CM | POA: Diagnosis not present

## 2020-04-17 DIAGNOSIS — R569 Unspecified convulsions: Secondary | ICD-10-CM | POA: Diagnosis not present

## 2020-04-21 DIAGNOSIS — M150 Primary generalized (osteo)arthritis: Secondary | ICD-10-CM | POA: Diagnosis not present

## 2020-04-21 DIAGNOSIS — I872 Venous insufficiency (chronic) (peripheral): Secondary | ICD-10-CM | POA: Diagnosis not present

## 2020-04-21 DIAGNOSIS — E119 Type 2 diabetes mellitus without complications: Secondary | ICD-10-CM | POA: Diagnosis not present

## 2020-04-21 DIAGNOSIS — I10 Essential (primary) hypertension: Secondary | ICD-10-CM | POA: Diagnosis not present

## 2020-04-21 DIAGNOSIS — E1151 Type 2 diabetes mellitus with diabetic peripheral angiopathy without gangrene: Secondary | ICD-10-CM | POA: Diagnosis not present

## 2020-04-21 DIAGNOSIS — Z794 Long term (current) use of insulin: Secondary | ICD-10-CM | POA: Diagnosis not present

## 2020-04-21 DIAGNOSIS — I480 Paroxysmal atrial fibrillation: Secondary | ICD-10-CM | POA: Diagnosis not present

## 2020-04-22 DIAGNOSIS — I83012 Varicose veins of right lower extremity with ulcer of calf: Secondary | ICD-10-CM | POA: Diagnosis not present

## 2020-04-22 DIAGNOSIS — E119 Type 2 diabetes mellitus without complications: Secondary | ICD-10-CM | POA: Diagnosis not present

## 2020-04-22 DIAGNOSIS — L97221 Non-pressure chronic ulcer of left calf limited to breakdown of skin: Secondary | ICD-10-CM | POA: Diagnosis not present

## 2020-04-22 DIAGNOSIS — L97922 Non-pressure chronic ulcer of unspecified part of left lower leg with fat layer exposed: Secondary | ICD-10-CM | POA: Diagnosis not present

## 2020-04-22 DIAGNOSIS — S81801D Unspecified open wound, right lower leg, subsequent encounter: Secondary | ICD-10-CM | POA: Diagnosis not present

## 2020-04-22 DIAGNOSIS — L97212 Non-pressure chronic ulcer of right calf with fat layer exposed: Secondary | ICD-10-CM | POA: Diagnosis not present

## 2020-04-22 DIAGNOSIS — I83022 Varicose veins of left lower extremity with ulcer of calf: Secondary | ICD-10-CM | POA: Diagnosis not present

## 2020-04-23 DIAGNOSIS — I10 Essential (primary) hypertension: Secondary | ICD-10-CM | POA: Diagnosis not present

## 2020-04-23 DIAGNOSIS — E1151 Type 2 diabetes mellitus with diabetic peripheral angiopathy without gangrene: Secondary | ICD-10-CM | POA: Diagnosis not present

## 2020-04-23 DIAGNOSIS — I480 Paroxysmal atrial fibrillation: Secondary | ICD-10-CM | POA: Diagnosis not present

## 2020-04-23 DIAGNOSIS — I872 Venous insufficiency (chronic) (peripheral): Secondary | ICD-10-CM | POA: Diagnosis not present

## 2020-04-23 DIAGNOSIS — M150 Primary generalized (osteo)arthritis: Secondary | ICD-10-CM | POA: Diagnosis not present

## 2020-04-23 DIAGNOSIS — Z794 Long term (current) use of insulin: Secondary | ICD-10-CM | POA: Diagnosis not present

## 2020-04-28 DIAGNOSIS — I480 Paroxysmal atrial fibrillation: Secondary | ICD-10-CM | POA: Diagnosis not present

## 2020-04-28 DIAGNOSIS — I10 Essential (primary) hypertension: Secondary | ICD-10-CM | POA: Diagnosis not present

## 2020-04-28 DIAGNOSIS — E1151 Type 2 diabetes mellitus with diabetic peripheral angiopathy without gangrene: Secondary | ICD-10-CM | POA: Diagnosis not present

## 2020-04-28 DIAGNOSIS — I872 Venous insufficiency (chronic) (peripheral): Secondary | ICD-10-CM | POA: Diagnosis not present

## 2020-04-28 DIAGNOSIS — M150 Primary generalized (osteo)arthritis: Secondary | ICD-10-CM | POA: Diagnosis not present

## 2020-04-28 DIAGNOSIS — Z794 Long term (current) use of insulin: Secondary | ICD-10-CM | POA: Diagnosis not present

## 2020-04-29 DIAGNOSIS — I83022 Varicose veins of left lower extremity with ulcer of calf: Secondary | ICD-10-CM | POA: Diagnosis not present

## 2020-04-29 DIAGNOSIS — L97212 Non-pressure chronic ulcer of right calf with fat layer exposed: Secondary | ICD-10-CM | POA: Diagnosis not present

## 2020-04-29 DIAGNOSIS — I83012 Varicose veins of right lower extremity with ulcer of calf: Secondary | ICD-10-CM | POA: Diagnosis not present

## 2020-04-29 DIAGNOSIS — L97221 Non-pressure chronic ulcer of left calf limited to breakdown of skin: Secondary | ICD-10-CM | POA: Diagnosis not present

## 2020-04-30 DIAGNOSIS — I10 Essential (primary) hypertension: Secondary | ICD-10-CM | POA: Diagnosis not present

## 2020-04-30 DIAGNOSIS — E1151 Type 2 diabetes mellitus with diabetic peripheral angiopathy without gangrene: Secondary | ICD-10-CM | POA: Diagnosis not present

## 2020-04-30 DIAGNOSIS — M150 Primary generalized (osteo)arthritis: Secondary | ICD-10-CM | POA: Diagnosis not present

## 2020-04-30 DIAGNOSIS — I480 Paroxysmal atrial fibrillation: Secondary | ICD-10-CM | POA: Diagnosis not present

## 2020-04-30 DIAGNOSIS — I872 Venous insufficiency (chronic) (peripheral): Secondary | ICD-10-CM | POA: Diagnosis not present

## 2020-04-30 DIAGNOSIS — Z794 Long term (current) use of insulin: Secondary | ICD-10-CM | POA: Diagnosis not present

## 2020-05-06 DIAGNOSIS — E11622 Type 2 diabetes mellitus with other skin ulcer: Secondary | ICD-10-CM | POA: Diagnosis not present

## 2020-05-06 DIAGNOSIS — I83012 Varicose veins of right lower extremity with ulcer of calf: Secondary | ICD-10-CM | POA: Diagnosis not present

## 2020-05-06 DIAGNOSIS — L97221 Non-pressure chronic ulcer of left calf limited to breakdown of skin: Secondary | ICD-10-CM | POA: Diagnosis not present

## 2020-05-06 DIAGNOSIS — I83022 Varicose veins of left lower extremity with ulcer of calf: Secondary | ICD-10-CM | POA: Diagnosis not present

## 2020-05-06 DIAGNOSIS — I10 Essential (primary) hypertension: Secondary | ICD-10-CM | POA: Diagnosis not present

## 2020-05-06 DIAGNOSIS — L97822 Non-pressure chronic ulcer of other part of left lower leg with fat layer exposed: Secondary | ICD-10-CM | POA: Diagnosis not present

## 2020-05-06 DIAGNOSIS — L97212 Non-pressure chronic ulcer of right calf with fat layer exposed: Secondary | ICD-10-CM | POA: Diagnosis not present

## 2020-05-06 DIAGNOSIS — I83028 Varicose veins of left lower extremity with ulcer other part of lower leg: Secondary | ICD-10-CM | POA: Diagnosis not present

## 2020-05-06 DIAGNOSIS — E782 Mixed hyperlipidemia: Secondary | ICD-10-CM | POA: Diagnosis not present

## 2020-05-07 DIAGNOSIS — I480 Paroxysmal atrial fibrillation: Secondary | ICD-10-CM | POA: Diagnosis not present

## 2020-05-07 DIAGNOSIS — I10 Essential (primary) hypertension: Secondary | ICD-10-CM | POA: Diagnosis not present

## 2020-05-07 DIAGNOSIS — Z794 Long term (current) use of insulin: Secondary | ICD-10-CM | POA: Diagnosis not present

## 2020-05-07 DIAGNOSIS — I872 Venous insufficiency (chronic) (peripheral): Secondary | ICD-10-CM | POA: Diagnosis not present

## 2020-05-07 DIAGNOSIS — M150 Primary generalized (osteo)arthritis: Secondary | ICD-10-CM | POA: Diagnosis not present

## 2020-05-07 DIAGNOSIS — E1151 Type 2 diabetes mellitus with diabetic peripheral angiopathy without gangrene: Secondary | ICD-10-CM | POA: Diagnosis not present

## 2020-05-12 DIAGNOSIS — E1151 Type 2 diabetes mellitus with diabetic peripheral angiopathy without gangrene: Secondary | ICD-10-CM | POA: Diagnosis not present

## 2020-05-12 DIAGNOSIS — Z794 Long term (current) use of insulin: Secondary | ICD-10-CM | POA: Diagnosis not present

## 2020-05-12 DIAGNOSIS — I872 Venous insufficiency (chronic) (peripheral): Secondary | ICD-10-CM | POA: Diagnosis not present

## 2020-05-12 DIAGNOSIS — I10 Essential (primary) hypertension: Secondary | ICD-10-CM | POA: Diagnosis not present

## 2020-05-12 DIAGNOSIS — I480 Paroxysmal atrial fibrillation: Secondary | ICD-10-CM | POA: Diagnosis not present

## 2020-05-12 DIAGNOSIS — M150 Primary generalized (osteo)arthritis: Secondary | ICD-10-CM | POA: Diagnosis not present

## 2020-05-13 DIAGNOSIS — L97812 Non-pressure chronic ulcer of other part of right lower leg with fat layer exposed: Secondary | ICD-10-CM | POA: Diagnosis not present

## 2020-05-13 DIAGNOSIS — L97221 Non-pressure chronic ulcer of left calf limited to breakdown of skin: Secondary | ICD-10-CM | POA: Diagnosis not present

## 2020-05-13 DIAGNOSIS — L97212 Non-pressure chronic ulcer of right calf with fat layer exposed: Secondary | ICD-10-CM | POA: Diagnosis not present

## 2020-05-13 DIAGNOSIS — I83022 Varicose veins of left lower extremity with ulcer of calf: Secondary | ICD-10-CM | POA: Diagnosis not present

## 2020-05-13 DIAGNOSIS — I83012 Varicose veins of right lower extremity with ulcer of calf: Secondary | ICD-10-CM | POA: Diagnosis not present

## 2020-05-19 DIAGNOSIS — E11622 Type 2 diabetes mellitus with other skin ulcer: Secondary | ICD-10-CM | POA: Diagnosis not present

## 2020-05-20 DIAGNOSIS — L97221 Non-pressure chronic ulcer of left calf limited to breakdown of skin: Secondary | ICD-10-CM | POA: Diagnosis not present

## 2020-05-20 DIAGNOSIS — E1151 Type 2 diabetes mellitus with diabetic peripheral angiopathy without gangrene: Secondary | ICD-10-CM | POA: Diagnosis not present

## 2020-05-20 DIAGNOSIS — I480 Paroxysmal atrial fibrillation: Secondary | ICD-10-CM | POA: Diagnosis not present

## 2020-05-20 DIAGNOSIS — I83012 Varicose veins of right lower extremity with ulcer of calf: Secondary | ICD-10-CM | POA: Diagnosis not present

## 2020-05-20 DIAGNOSIS — I10 Essential (primary) hypertension: Secondary | ICD-10-CM | POA: Diagnosis not present

## 2020-05-20 DIAGNOSIS — Z794 Long term (current) use of insulin: Secondary | ICD-10-CM | POA: Diagnosis not present

## 2020-05-20 DIAGNOSIS — L97812 Non-pressure chronic ulcer of other part of right lower leg with fat layer exposed: Secondary | ICD-10-CM | POA: Diagnosis not present

## 2020-05-20 DIAGNOSIS — I872 Venous insufficiency (chronic) (peripheral): Secondary | ICD-10-CM | POA: Diagnosis not present

## 2020-05-20 DIAGNOSIS — L97212 Non-pressure chronic ulcer of right calf with fat layer exposed: Secondary | ICD-10-CM | POA: Diagnosis not present

## 2020-05-20 DIAGNOSIS — I83022 Varicose veins of left lower extremity with ulcer of calf: Secondary | ICD-10-CM | POA: Diagnosis not present

## 2020-05-20 DIAGNOSIS — M150 Primary generalized (osteo)arthritis: Secondary | ICD-10-CM | POA: Diagnosis not present

## 2020-05-20 DIAGNOSIS — L97822 Non-pressure chronic ulcer of other part of left lower leg with fat layer exposed: Secondary | ICD-10-CM | POA: Diagnosis not present

## 2020-05-25 DIAGNOSIS — Q278 Other specified congenital malformations of peripheral vascular system: Secondary | ICD-10-CM | POA: Diagnosis not present

## 2020-05-25 DIAGNOSIS — Z794 Long term (current) use of insulin: Secondary | ICD-10-CM | POA: Diagnosis not present

## 2020-05-25 DIAGNOSIS — R2689 Other abnormalities of gait and mobility: Secondary | ICD-10-CM | POA: Diagnosis not present

## 2020-05-25 DIAGNOSIS — E119 Type 2 diabetes mellitus without complications: Secondary | ICD-10-CM | POA: Diagnosis not present

## 2020-05-25 DIAGNOSIS — M2578 Osteophyte, vertebrae: Secondary | ICD-10-CM | POA: Diagnosis not present

## 2020-05-25 DIAGNOSIS — E871 Hypo-osmolality and hyponatremia: Secondary | ICD-10-CM | POA: Diagnosis not present

## 2020-05-25 DIAGNOSIS — R6889 Other general symptoms and signs: Secondary | ICD-10-CM | POA: Diagnosis not present

## 2020-05-25 DIAGNOSIS — A419 Sepsis, unspecified organism: Secondary | ICD-10-CM | POA: Diagnosis not present

## 2020-05-25 DIAGNOSIS — Z79899 Other long term (current) drug therapy: Secondary | ICD-10-CM | POA: Diagnosis not present

## 2020-05-25 DIAGNOSIS — E785 Hyperlipidemia, unspecified: Secondary | ICD-10-CM | POA: Diagnosis not present

## 2020-05-25 DIAGNOSIS — J9601 Acute respiratory failure with hypoxia: Secondary | ICD-10-CM | POA: Diagnosis not present

## 2020-05-25 DIAGNOSIS — R569 Unspecified convulsions: Secondary | ICD-10-CM | POA: Diagnosis not present

## 2020-05-25 DIAGNOSIS — R0602 Shortness of breath: Secondary | ICD-10-CM | POA: Diagnosis not present

## 2020-05-25 DIAGNOSIS — R6520 Severe sepsis without septic shock: Secondary | ICD-10-CM | POA: Diagnosis not present

## 2020-05-25 DIAGNOSIS — I480 Paroxysmal atrial fibrillation: Secondary | ICD-10-CM | POA: Diagnosis not present

## 2020-05-25 DIAGNOSIS — I499 Cardiac arrhythmia, unspecified: Secondary | ICD-10-CM | POA: Diagnosis not present

## 2020-05-25 DIAGNOSIS — R1312 Dysphagia, oropharyngeal phase: Secondary | ICD-10-CM | POA: Diagnosis not present

## 2020-05-25 DIAGNOSIS — R410 Disorientation, unspecified: Secondary | ICD-10-CM | POA: Diagnosis not present

## 2020-05-25 DIAGNOSIS — I10 Essential (primary) hypertension: Secondary | ICD-10-CM | POA: Diagnosis not present

## 2020-05-25 DIAGNOSIS — Z66 Do not resuscitate: Secondary | ICD-10-CM | POA: Diagnosis not present

## 2020-05-25 DIAGNOSIS — R5383 Other fatigue: Secondary | ICD-10-CM | POA: Diagnosis not present

## 2020-05-25 DIAGNOSIS — M6250 Muscle wasting and atrophy, not elsewhere classified, unspecified site: Secondary | ICD-10-CM | POA: Diagnosis not present

## 2020-05-25 DIAGNOSIS — E1142 Type 2 diabetes mellitus with diabetic polyneuropathy: Secondary | ICD-10-CM | POA: Diagnosis not present

## 2020-05-25 DIAGNOSIS — R293 Abnormal posture: Secondary | ICD-10-CM | POA: Diagnosis not present

## 2020-05-25 DIAGNOSIS — R059 Cough, unspecified: Secondary | ICD-10-CM | POA: Diagnosis not present

## 2020-05-25 DIAGNOSIS — I119 Hypertensive heart disease without heart failure: Secondary | ICD-10-CM | POA: Diagnosis not present

## 2020-05-25 DIAGNOSIS — E1165 Type 2 diabetes mellitus with hyperglycemia: Secondary | ICD-10-CM | POA: Diagnosis not present

## 2020-05-25 DIAGNOSIS — Z7901 Long term (current) use of anticoagulants: Secondary | ICD-10-CM | POA: Diagnosis not present

## 2020-05-25 DIAGNOSIS — I2510 Atherosclerotic heart disease of native coronary artery without angina pectoris: Secondary | ICD-10-CM | POA: Diagnosis not present

## 2020-05-25 DIAGNOSIS — I872 Venous insufficiency (chronic) (peripheral): Secondary | ICD-10-CM | POA: Diagnosis not present

## 2020-05-25 DIAGNOSIS — I517 Cardiomegaly: Secondary | ICD-10-CM | POA: Diagnosis not present

## 2020-05-25 DIAGNOSIS — Z743 Need for continuous supervision: Secondary | ICD-10-CM | POA: Diagnosis not present

## 2020-05-25 DIAGNOSIS — R1310 Dysphagia, unspecified: Secondary | ICD-10-CM | POA: Diagnosis not present

## 2020-05-25 DIAGNOSIS — R404 Transient alteration of awareness: Secondary | ICD-10-CM | POA: Diagnosis not present

## 2020-05-25 DIAGNOSIS — Z741 Need for assistance with personal care: Secondary | ICD-10-CM | POA: Diagnosis not present

## 2020-05-25 DIAGNOSIS — R279 Unspecified lack of coordination: Secondary | ICD-10-CM | POA: Diagnosis not present

## 2020-05-25 DIAGNOSIS — I4891 Unspecified atrial fibrillation: Secondary | ICD-10-CM | POA: Diagnosis not present

## 2020-05-25 DIAGNOSIS — Z20822 Contact with and (suspected) exposure to covid-19: Secondary | ICD-10-CM | POA: Diagnosis not present

## 2020-05-26 DIAGNOSIS — E119 Type 2 diabetes mellitus without complications: Secondary | ICD-10-CM | POA: Diagnosis not present

## 2020-05-26 DIAGNOSIS — Z794 Long term (current) use of insulin: Secondary | ICD-10-CM | POA: Diagnosis not present

## 2020-05-26 DIAGNOSIS — Z7901 Long term (current) use of anticoagulants: Secondary | ICD-10-CM | POA: Diagnosis not present

## 2020-05-26 DIAGNOSIS — I4891 Unspecified atrial fibrillation: Secondary | ICD-10-CM | POA: Diagnosis not present

## 2020-05-26 DIAGNOSIS — E785 Hyperlipidemia, unspecified: Secondary | ICD-10-CM | POA: Diagnosis not present

## 2020-05-26 DIAGNOSIS — I10 Essential (primary) hypertension: Secondary | ICD-10-CM | POA: Diagnosis not present

## 2020-05-27 DIAGNOSIS — I480 Paroxysmal atrial fibrillation: Secondary | ICD-10-CM | POA: Diagnosis not present

## 2020-05-27 DIAGNOSIS — I10 Essential (primary) hypertension: Secondary | ICD-10-CM | POA: Diagnosis not present

## 2020-05-27 DIAGNOSIS — E1165 Type 2 diabetes mellitus with hyperglycemia: Secondary | ICD-10-CM | POA: Diagnosis not present

## 2020-05-27 DIAGNOSIS — E785 Hyperlipidemia, unspecified: Secondary | ICD-10-CM | POA: Diagnosis not present

## 2020-05-28 DIAGNOSIS — I10 Essential (primary) hypertension: Secondary | ICD-10-CM | POA: Diagnosis not present

## 2020-05-28 DIAGNOSIS — Z7901 Long term (current) use of anticoagulants: Secondary | ICD-10-CM | POA: Diagnosis not present

## 2020-05-28 DIAGNOSIS — I480 Paroxysmal atrial fibrillation: Secondary | ICD-10-CM | POA: Diagnosis not present

## 2020-05-28 DIAGNOSIS — E785 Hyperlipidemia, unspecified: Secondary | ICD-10-CM | POA: Diagnosis not present

## 2020-05-28 DIAGNOSIS — R569 Unspecified convulsions: Secondary | ICD-10-CM | POA: Diagnosis not present

## 2020-05-28 DIAGNOSIS — E1165 Type 2 diabetes mellitus with hyperglycemia: Secondary | ICD-10-CM | POA: Diagnosis not present

## 2020-05-29 DIAGNOSIS — E785 Hyperlipidemia, unspecified: Secondary | ICD-10-CM | POA: Diagnosis not present

## 2020-05-29 DIAGNOSIS — Z7901 Long term (current) use of anticoagulants: Secondary | ICD-10-CM | POA: Diagnosis not present

## 2020-05-29 DIAGNOSIS — R5383 Other fatigue: Secondary | ICD-10-CM | POA: Diagnosis not present

## 2020-05-29 DIAGNOSIS — I10 Essential (primary) hypertension: Secondary | ICD-10-CM | POA: Diagnosis not present

## 2020-05-29 DIAGNOSIS — E1165 Type 2 diabetes mellitus with hyperglycemia: Secondary | ICD-10-CM | POA: Diagnosis not present

## 2020-05-29 DIAGNOSIS — I4891 Unspecified atrial fibrillation: Secondary | ICD-10-CM | POA: Diagnosis not present

## 2020-05-29 DIAGNOSIS — I480 Paroxysmal atrial fibrillation: Secondary | ICD-10-CM | POA: Diagnosis not present

## 2020-05-30 DIAGNOSIS — Z7901 Long term (current) use of anticoagulants: Secondary | ICD-10-CM | POA: Diagnosis not present

## 2020-05-30 DIAGNOSIS — I480 Paroxysmal atrial fibrillation: Secondary | ICD-10-CM | POA: Diagnosis not present

## 2020-05-30 DIAGNOSIS — E785 Hyperlipidemia, unspecified: Secondary | ICD-10-CM | POA: Diagnosis not present

## 2020-05-30 DIAGNOSIS — R5383 Other fatigue: Secondary | ICD-10-CM | POA: Diagnosis not present

## 2020-05-30 DIAGNOSIS — I10 Essential (primary) hypertension: Secondary | ICD-10-CM | POA: Diagnosis not present

## 2020-05-30 DIAGNOSIS — E1165 Type 2 diabetes mellitus with hyperglycemia: Secondary | ICD-10-CM | POA: Diagnosis not present

## 2020-05-31 DIAGNOSIS — Z7901 Long term (current) use of anticoagulants: Secondary | ICD-10-CM | POA: Diagnosis not present

## 2020-05-31 DIAGNOSIS — R410 Disorientation, unspecified: Secondary | ICD-10-CM | POA: Diagnosis not present

## 2020-05-31 DIAGNOSIS — I480 Paroxysmal atrial fibrillation: Secondary | ICD-10-CM | POA: Diagnosis not present

## 2020-05-31 DIAGNOSIS — I872 Venous insufficiency (chronic) (peripheral): Secondary | ICD-10-CM | POA: Diagnosis not present

## 2020-05-31 DIAGNOSIS — I83028 Varicose veins of left lower extremity with ulcer other part of lower leg: Secondary | ICD-10-CM | POA: Diagnosis not present

## 2020-05-31 DIAGNOSIS — R293 Abnormal posture: Secondary | ICD-10-CM | POA: Diagnosis not present

## 2020-05-31 DIAGNOSIS — Z741 Need for assistance with personal care: Secondary | ICD-10-CM | POA: Diagnosis not present

## 2020-05-31 DIAGNOSIS — I83018 Varicose veins of right lower extremity with ulcer other part of lower leg: Secondary | ICD-10-CM | POA: Diagnosis not present

## 2020-05-31 DIAGNOSIS — R279 Unspecified lack of coordination: Secondary | ICD-10-CM | POA: Diagnosis not present

## 2020-05-31 DIAGNOSIS — Z794 Long term (current) use of insulin: Secondary | ICD-10-CM | POA: Diagnosis not present

## 2020-05-31 DIAGNOSIS — J9601 Acute respiratory failure with hypoxia: Secondary | ICD-10-CM | POA: Diagnosis not present

## 2020-05-31 DIAGNOSIS — E119 Type 2 diabetes mellitus without complications: Secondary | ICD-10-CM | POA: Diagnosis not present

## 2020-05-31 DIAGNOSIS — R1312 Dysphagia, oropharyngeal phase: Secondary | ICD-10-CM | POA: Diagnosis not present

## 2020-05-31 DIAGNOSIS — R569 Unspecified convulsions: Secondary | ICD-10-CM | POA: Diagnosis not present

## 2020-05-31 DIAGNOSIS — I10 Essential (primary) hypertension: Secondary | ICD-10-CM | POA: Diagnosis not present

## 2020-05-31 DIAGNOSIS — Z743 Need for continuous supervision: Secondary | ICD-10-CM | POA: Diagnosis not present

## 2020-05-31 DIAGNOSIS — E785 Hyperlipidemia, unspecified: Secondary | ICD-10-CM | POA: Diagnosis not present

## 2020-05-31 DIAGNOSIS — M6250 Muscle wasting and atrophy, not elsewhere classified, unspecified site: Secondary | ICD-10-CM | POA: Diagnosis not present

## 2020-05-31 DIAGNOSIS — R2689 Other abnormalities of gait and mobility: Secondary | ICD-10-CM | POA: Diagnosis not present

## 2020-06-03 DIAGNOSIS — I83028 Varicose veins of left lower extremity with ulcer other part of lower leg: Secondary | ICD-10-CM | POA: Diagnosis not present

## 2020-06-10 DIAGNOSIS — I83018 Varicose veins of right lower extremity with ulcer other part of lower leg: Secondary | ICD-10-CM | POA: Diagnosis not present

## 2020-06-24 DIAGNOSIS — Z09 Encounter for follow-up examination after completed treatment for conditions other than malignant neoplasm: Secondary | ICD-10-CM | POA: Diagnosis not present

## 2020-06-24 DIAGNOSIS — I872 Venous insufficiency (chronic) (peripheral): Secondary | ICD-10-CM | POA: Diagnosis not present

## 2020-06-24 DIAGNOSIS — L97811 Non-pressure chronic ulcer of other part of right lower leg limited to breakdown of skin: Secondary | ICD-10-CM | POA: Diagnosis not present

## 2020-06-24 DIAGNOSIS — E782 Mixed hyperlipidemia: Secondary | ICD-10-CM | POA: Diagnosis not present

## 2020-06-24 DIAGNOSIS — E11622 Type 2 diabetes mellitus with other skin ulcer: Secondary | ICD-10-CM | POA: Diagnosis not present

## 2020-06-24 DIAGNOSIS — I10 Essential (primary) hypertension: Secondary | ICD-10-CM | POA: Diagnosis not present

## 2020-06-25 DIAGNOSIS — Z4800 Encounter for change or removal of nonsurgical wound dressing: Secondary | ICD-10-CM | POA: Diagnosis not present

## 2020-06-25 DIAGNOSIS — L97811 Non-pressure chronic ulcer of other part of right lower leg limited to breakdown of skin: Secondary | ICD-10-CM | POA: Diagnosis not present

## 2020-06-25 DIAGNOSIS — L97829 Non-pressure chronic ulcer of other part of left lower leg with unspecified severity: Secondary | ICD-10-CM | POA: Diagnosis not present

## 2020-06-25 DIAGNOSIS — I872 Venous insufficiency (chronic) (peripheral): Secondary | ICD-10-CM | POA: Diagnosis not present

## 2020-06-25 DIAGNOSIS — E1151 Type 2 diabetes mellitus with diabetic peripheral angiopathy without gangrene: Secondary | ICD-10-CM | POA: Diagnosis not present

## 2020-06-26 DIAGNOSIS — L97829 Non-pressure chronic ulcer of other part of left lower leg with unspecified severity: Secondary | ICD-10-CM | POA: Diagnosis not present

## 2020-06-26 DIAGNOSIS — I872 Venous insufficiency (chronic) (peripheral): Secondary | ICD-10-CM | POA: Diagnosis not present

## 2020-06-26 DIAGNOSIS — Z4800 Encounter for change or removal of nonsurgical wound dressing: Secondary | ICD-10-CM | POA: Diagnosis not present

## 2020-06-26 DIAGNOSIS — E1151 Type 2 diabetes mellitus with diabetic peripheral angiopathy without gangrene: Secondary | ICD-10-CM | POA: Diagnosis not present

## 2020-06-26 DIAGNOSIS — L97811 Non-pressure chronic ulcer of other part of right lower leg limited to breakdown of skin: Secondary | ICD-10-CM | POA: Diagnosis not present

## 2020-06-27 DIAGNOSIS — E119 Type 2 diabetes mellitus without complications: Secondary | ICD-10-CM | POA: Diagnosis not present

## 2020-07-01 DIAGNOSIS — L97811 Non-pressure chronic ulcer of other part of right lower leg limited to breakdown of skin: Secondary | ICD-10-CM | POA: Diagnosis not present

## 2020-07-01 DIAGNOSIS — L97829 Non-pressure chronic ulcer of other part of left lower leg with unspecified severity: Secondary | ICD-10-CM | POA: Diagnosis not present

## 2020-07-01 DIAGNOSIS — Z4800 Encounter for change or removal of nonsurgical wound dressing: Secondary | ICD-10-CM | POA: Diagnosis not present

## 2020-07-01 DIAGNOSIS — E1151 Type 2 diabetes mellitus with diabetic peripheral angiopathy without gangrene: Secondary | ICD-10-CM | POA: Diagnosis not present

## 2020-07-01 DIAGNOSIS — I872 Venous insufficiency (chronic) (peripheral): Secondary | ICD-10-CM | POA: Diagnosis not present

## 2023-03-08 NOTE — Progress Notes (Signed)
 This encounter was created in error - please disregard.

## 2024-03-04 DEATH — deceased
